# Patient Record
Sex: Male | Born: 1965 | Race: White | Hispanic: No | Marital: Married | Smoking: Former smoker
Health system: Southern US, Community
[De-identification: ages and names within clinical notes are randomized; demographics above are authoritative.]

## PROBLEM LIST (undated history)

## (undated) DIAGNOSIS — I10 Essential (primary) hypertension: Secondary | ICD-10-CM

## (undated) DIAGNOSIS — R0600 Dyspnea, unspecified: Secondary | ICD-10-CM

## (undated) DIAGNOSIS — F419 Anxiety disorder, unspecified: Secondary | ICD-10-CM

## (undated) DIAGNOSIS — E663 Overweight: Secondary | ICD-10-CM

## (undated) DIAGNOSIS — F4310 Post-traumatic stress disorder, unspecified: Secondary | ICD-10-CM

## (undated) DIAGNOSIS — D649 Anemia, unspecified: Secondary | ICD-10-CM

## (undated) DIAGNOSIS — J449 Chronic obstructive pulmonary disease, unspecified: Secondary | ICD-10-CM

## (undated) DIAGNOSIS — R9439 Abnormal result of other cardiovascular function study: Secondary | ICD-10-CM

## (undated) DIAGNOSIS — E871 Hypo-osmolality and hyponatremia: Secondary | ICD-10-CM

## (undated) DIAGNOSIS — F32A Depression, unspecified: Secondary | ICD-10-CM

## (undated) DIAGNOSIS — M1990 Unspecified osteoarthritis, unspecified site: Secondary | ICD-10-CM

## (undated) DIAGNOSIS — K219 Gastro-esophageal reflux disease without esophagitis: Secondary | ICD-10-CM

## (undated) DIAGNOSIS — R079 Chest pain, unspecified: Secondary | ICD-10-CM

## (undated) DIAGNOSIS — I83893 Varicose veins of bilateral lower extremities with other complications: Secondary | ICD-10-CM

## (undated) HISTORY — DX: Varicose veins of bilateral lower extremities with other complications: I83.893

## (undated) HISTORY — DX: Essential (primary) hypertension: I10

## (undated) HISTORY — PX: LUMBAR LAMINECTOMY: SHX95

## (undated) HISTORY — DX: Hypo-osmolality and hyponatremia: E87.1

## (undated) HISTORY — PX: REVISION TOTAL HIP ARTHROPLASTY: SHX766

## (undated) HISTORY — PX: WISDOM TOOTH EXTRACTION: SHX21

## (undated) HISTORY — DX: Abnormal result of other cardiovascular function study: R94.39

## (undated) HISTORY — DX: Overweight: E66.3

## (undated) HISTORY — DX: Chest pain, unspecified: R07.9

## (undated) HISTORY — PX: TENDON RELEASE: SHX230

---

## 1998-01-26 HISTORY — PX: LUMBAR LAMINECTOMY: SHX95

## 2006-01-26 HISTORY — PX: KNEE ARTHROSCOPY: SHX127

## 2016-01-27 HISTORY — PX: TENDON RELEASE: SHX230

## 2016-04-21 DIAGNOSIS — G8929 Other chronic pain: Secondary | ICD-10-CM | POA: Insufficient documentation

## 2016-04-21 DIAGNOSIS — M549 Dorsalgia, unspecified: Secondary | ICD-10-CM | POA: Insufficient documentation

## 2016-10-21 DIAGNOSIS — M5412 Radiculopathy, cervical region: Secondary | ICD-10-CM | POA: Insufficient documentation

## 2019-01-27 HISTORY — PX: HIP ARTHROPLASTY: SHX981

## 2019-02-27 DIAGNOSIS — R079 Chest pain, unspecified: Secondary | ICD-10-CM

## 2019-02-27 HISTORY — DX: Chest pain, unspecified: R07.9

## 2019-03-06 ENCOUNTER — Telehealth: Payer: Self-pay | Admitting: Internal Medicine

## 2019-03-06 ENCOUNTER — Ambulatory Visit (INDEPENDENT_AMBULATORY_CARE_PROVIDER_SITE_OTHER): Payer: No Typology Code available for payment source | Admitting: Internal Medicine

## 2019-03-06 ENCOUNTER — Other Ambulatory Visit: Payer: Self-pay

## 2019-03-06 ENCOUNTER — Encounter: Payer: Self-pay | Admitting: Internal Medicine

## 2019-03-06 VITALS — BP 118/88 | HR 81 | Temp 97.3°F | Ht 75.0 in | Wt 231.8 lb

## 2019-03-06 DIAGNOSIS — R9431 Abnormal electrocardiogram [ECG] [EKG]: Secondary | ICD-10-CM

## 2019-03-06 DIAGNOSIS — R0609 Other forms of dyspnea: Secondary | ICD-10-CM

## 2019-03-06 DIAGNOSIS — I10 Essential (primary) hypertension: Secondary | ICD-10-CM

## 2019-03-06 DIAGNOSIS — R079 Chest pain, unspecified: Secondary | ICD-10-CM

## 2019-03-06 DIAGNOSIS — R0600 Dyspnea, unspecified: Secondary | ICD-10-CM | POA: Diagnosis not present

## 2019-03-06 NOTE — Telephone Encounter (Signed)
Routed to primary nurse and medical records dept

## 2019-03-06 NOTE — Telephone Encounter (Signed)
Sarah from the [facility] was requesting visit notes from the patient's appointment today. Please fax notes to [phone]

## 2019-03-06 NOTE — Progress Notes (Signed)
Cardiology Office Note:    Date:  03/06/2019   ID:  Reginald Walsh, DOB 10/16/1965, MRN [id number]  PCP:  Patient, No Pcp Per  Cardiologist:  No primary care provider on file.  Electrophysiologist:  None   Referring MD: Annetta Maw, MD   Chief Complaint: DOE, chest pain  History of Present Illness:    Reginald Walsh is a 54 y.o. male with a history of HTN and anxiety who presents for evaluation of chest pain.  Started Feb of last year - progressive sob, especially with anxiety. Unusual fatigue for him. Chest pain with heavy exertion. Substernal pain, non radiating. Subsides quickly (seconds to minutes). Not associated with deep breathing.   Used to do [employer], but gained some weight recently, notices that when he carries 2 loads of groceries he will have dyspnea on exertion, with a twinge of chest pain. He notices the chest twinge a few times a week. Feels it may be related to anxiety.   Paternal side - strong family history of MI. No SCD known.  Mothers side  Diabetes and smoking.   History of normal echo per his report however Q waves on ekg from 2017.  Past Medical History:  Diagnosis Date  . Hypertension     History reviewed. No pertinent surgical history.  Current Medications: Current Meds  Medication Sig  . HYDROcodone-acetaminophen (HYCET) 7.5-325 mg/15 ml solution 1 tablet as needed     Allergies:   Patient has no allergy information on record.   Social History   Socioeconomic History  . Marital status: Single    Spouse name: Not on file  . Number of children: Not on file  . Years of education: Not on file  . Highest education level: Not on file  Occupational History  . Not on file  Tobacco Use  . Smoking status: Current Every Day Smoker    Types: E-cigarettes  . Smokeless tobacco: Current User  Substance and Sexual Activity  . Alcohol use: Yes    Comment: occasional  . Drug use: Never  . Sexual activity: Yes  Other Topics Concern  . Not on  file  Social History Narrative  . Not on file   Social Determinants of Health   Financial Resource Strain:   . Difficulty of Paying Living Expenses: Not on file  Food Insecurity:   . Worried About Charity fundraiser in the Last Year: Not on file  . Ran Out of Food in the Last Year: Not on file  Transportation Needs:   . Lack of Transportation (Medical): Not on file  . Lack of Transportation (Non-Medical): Not on file  Physical Activity:   . Days of Exercise per Week: Not on file  . Minutes of Exercise per Session: Not on file  Stress:   . Feeling of Stress : Not on file  Social Connections:   . Frequency of Communication with Friends and Family: Not on file  . Frequency of Social Gatherings with Friends and Family: Not on file  . Attends Religious Services: Not on file  . Active Member of Clubs or Organizations: Not on file  . Attends Archivist Meetings: Not on file  . Marital Status: Not on file     Family History: The patient's family history is significant for CAD, Diabetes.   ROS:   Please see the history of present illness.    All other systems reviewed and are negative.  EKGs/Labs/Other Studies Reviewed:    The  following studies were reviewed today:  EKG:  NSR, LVH, inferior infarct age indeterminate.   No significant change from ECG patient brings with him today from 2017.   Recent Labs: No results found for requested labs within last 8760 hours.  Recent Lipid Panel No results found for: CHOL, TRIG, HDL, CHOLHDL, VLDL, LDLCALC, LDLDIRECT  Physical Exam:    VS:  BP 118/88   Pulse 81   Temp (!) 97.3 F (36.3 C)   Ht 5\' 3"  (1.6 m)   Wt 231 lb 12.8 oz (105.1 kg)   SpO2 97%   BMI 41.06 kg/m     Wt Readings from Last 5 Encounters:  03/06/19 231 lb 12.8 oz (105.1 kg)     Constitutional: No acute distress Eyes: sclera non-icteric, normal conjunctiva and lids Cardiovascular: regular rhythm, normal rate, no murmurs. S1 and S2 normal. Radial  pulses normal bilaterally. No jugular venous distention.  Respiratory: clear to auscultation bilaterally GI : normal bowel sounds, soft and nontender. No distention.   MSK: extremities warm, well perfused. No edema.  NEURO: grossly nonfocal exam, moves all extremities. PSYCH: alert and oriented x 3, normal mood and affect.   ASSESSMENT:    1. DOE (dyspnea on exertion)   2. Chest pain, unspecified type   3. Essential hypertension   4. Abnormal EKG    PLAN:    DOE/Chest pain - With history of hypertension and exercise intolerance, we will peform a treadmill exercise test to exclude ischemia. Patient tells me can perform a treadmill test and this will provide information about his new exercise intolerance. He has an abnormal EKG, so we will include imaging in stress modality. Patient tells me he has recently had a normal echo and while the report is not available for my review patient shows me a personal email with a brief description of findings. If there are any concerning findings on stress we will consider echocardiogram given abnormal EKG. ADDENDUM: patient inquired through MyChart if he can have anti-anxiety medication prior to his treadmill stress test. I have instructed the patient that it would not be safe to provide sedating medications prior to exercise stress test given concerns for possible injury that could occur if sedated, and that we will not provide a prescription for anxiety medications.   HTN - well controlled at this time, continue losartan and amlodipine.   Total time of encounter: 60 minutes total time of encounter, including 35 minutes spent in face-to-face patient care. This time includes coordination of care and counseling regarding above mentioned problem list. Remainder of non-face-to-face time involved reviewing chart documents/testing relevant to the patient encounter and documentation in the medical record. I have independently reviewed documentation from referring  provider. I have reviewed approximately 10 pages of outside ([place]) records in conjunction with this consult.   Cherlynn Kaiser, MD [place]  [facility]    Medication Adjustments/Labs and Tests Ordered: Current medicines are reviewed at length with the patient today.  Concerns regarding medicines are outlined above.  Orders Placed This Encounter  Procedures  . MYOCARDIAL PERFUSION IMAGING  . EKG 12-Lead   No orders of the defined types were placed in this encounter.   Patient Instructions  Medication Instructions:  NO CHANGES  Lab Work:  will need a covid test 3 days  prior to Stress myoview  ( will need to self isolate between the covid test and stress test  Testing/Procedures: WILL BE SCHEDULE AT [place] has requested that  you have en exercise stress myoview.  Please follow instruction sheet, as given.   Follow-Up: At [facility], you and your health needs are our priority.  As part of our continuing mission to provide you with exceptional heart care, we have created designated Provider Care Teams.  These Care Teams include your primary Cardiologist (physician) and Advanced Practice Providers (APPs -  Physician Assistants and Nurse Practitioners) who all work together to provide you with the care you need, when you need it.  Your next appointment:   4 week(s)  The format for your next appointment:   In Person  Provider:   Kerin Ransom, PA-C  Other Instructions N/a    Cardiac Nuclear Scan A cardiac nuclear scan is a test that measures blood flow to the heart when a person is resting and when he or she is exercising. The test looks for problems such as:  Not enough blood reaching a portion of the heart.  The heart muscle not working normally. You may need this test if:  You have heart disease.  You have had abnormal lab results.  You have had heart surgery or a balloon procedure to open up blocked arteries  (angioplasty).  You have chest pain.  You have shortness of breath. In this test, a radioactive dye (tracer) is injected into your bloodstream. After the tracer has traveled to your heart, an imaging device is used to measure how much of the tracer is absorbed by or distributed to various areas of your heart. This procedure is usually done at a hospital and takes 2-4 hours. Tell a health care provider about:  Any allergies you have.  All medicines you are taking, including vitamins, herbs, eye drops, creams, and over-the-counter medicines.  Any problems you or family members have had with anesthetic medicines.  Any blood disorders you have.  Any surgeries you have had.  Any medical conditions you have.  Whether you are pregnant or may be pregnant. What are the risks? Generally, this is a safe procedure. However, problems may occur, including:  Serious chest pain and heart attack. This is only a risk if the stress portion of the test is done.  Rapid heartbeat.  Sensation of warmth in your chest. This usually passes quickly.  Allergic reaction to the tracer. What happens before the procedure?  Ask your health care provider about changing or stopping your regular medicines. This is especially important if you are taking diabetes medicines or blood thinners.  Follow instructions from your health care provider about eating or drinking restrictions.  Remove your jewelry on the day of the procedure. What happens during the procedure?  An IV will be inserted into one of your veins.  Your health care provider will inject a small amount of radioactive tracer through the IV.  You will wait for 20-40 minutes while the tracer travels through your bloodstream.  Your heart activity will be monitored with an electrocardiogram (ECG).  You will lie down on an exam table.  Images of your heart will be taken for about 15-20 minutes.  You may also have a stress test. For this test, one  of the following may be done: ? You will exercise on a treadmill or stationary bike. While you exercise, your heart's activity will be monitored with an ECG, and your blood pressure will be checked. ? You will be given medicines that will increase blood flow to parts of your heart. This is done if you are unable to exercise.  When  blood flow to your heart has peaked, a tracer will again be injected through the IV.  After 20-40 minutes, you will get back on the exam table and have more images taken of your heart.  Depending on the type of tracer used, scans may need to be repeated 3-4 hours later.  Your IV line will be removed when the procedure is over. The procedure may vary among health care providers and hospitals. What happens after the procedure?  Unless your health care provider tells you otherwise, you may return to your normal schedule, including diet, activities, and medicines.  Unless your health care provider tells you otherwise, you may increase your fluid intake. This will help to flush the contrast dye from your body. Drink enough fluid to keep your urine pale yellow.  Ask your health care provider, or the department that is doing the test: ? When will my results be ready? ? How will I get my results? Summary  A cardiac nuclear scan measures the blood flow to the heart when a person is resting and when he or she is exercising.  Tell your health care provider if you are pregnant.  Before the procedure, ask your health care provider about changing or stopping your regular medicines. This is especially important if you are taking diabetes medicines or blood thinners.  After the procedure, unless your health care provider tells you otherwise, increase your fluid intake. This will help flush the contrast dye from your body.  After the procedure, unless your health care provider tells you otherwise, you may return to your normal schedule, including diet, activities, and  medicines. This information is not intended to replace advice given to you by your health care provider. Make sure you discuss any questions you have with your health care provider. Document Revised: 06/28/2017 Document Reviewed: 06/28/2017 Elsevier Patient Education  [place].

## 2019-03-06 NOTE — Patient Instructions (Signed)
Medication Instructions:  NO CHANGES  Lab Work:  will need a covid test 3 days  prior to Stress myoview  ( will need to self isolate between the covid test and stress test  Testing/Procedures: WILL BE SCHEDULE AT [place] has requested that you have en exercise stress myoview.  Please follow instruction sheet, as given.   Follow-Up: At [facility], you and your health needs are our priority.  As part of our continuing mission to provide you with exceptional heart care, we have created designated Provider Care Teams.  These Care Teams include your primary Cardiologist (physician) and Advanced Practice Providers (APPs -  Physician Assistants and Nurse Practitioners) who all work together to provide you with the care you need, when you need it.  Your next appointment:   4 week(s)  The format for your next appointment:   In Person  Provider:   Kerin Ransom, PA-C  Other Instructions N/a    Cardiac Nuclear Scan A cardiac nuclear scan is a test that measures blood flow to the heart when a person is resting and when he or she is exercising. The test looks for problems such as:  Not enough blood reaching a portion of the heart.  The heart muscle not working normally. You may need this test if:  You have heart disease.  You have had abnormal lab results.  You have had heart surgery or a balloon procedure to open up blocked arteries (angioplasty).  You have chest pain.  You have shortness of breath. In this test, a radioactive dye (tracer) is injected into your bloodstream. After the tracer has traveled to your heart, an imaging device is used to measure how much of the tracer is absorbed by or distributed to various areas of your heart. This procedure is usually done at a hospital and takes 2-4 hours. Tell a health care provider about:  Any allergies you have.  All medicines you are taking, including vitamins, herbs, eye drops, creams, and  over-the-counter medicines.  Any problems you or family members have had with anesthetic medicines.  Any blood disorders you have.  Any surgeries you have had.  Any medical conditions you have.  Whether you are pregnant or may be pregnant. What are the risks? Generally, this is a safe procedure. However, problems may occur, including:  Serious chest pain and heart attack. This is only a risk if the stress portion of the test is done.  Rapid heartbeat.  Sensation of warmth in your chest. This usually passes quickly.  Allergic reaction to the tracer. What happens before the procedure?  Ask your health care provider about changing or stopping your regular medicines. This is especially important if you are taking diabetes medicines or blood thinners.  Follow instructions from your health care provider about eating or drinking restrictions.  Remove your jewelry on the day of the procedure. What happens during the procedure?  An IV will be inserted into one of your veins.  Your health care provider will inject a small amount of radioactive tracer through the IV.  You will wait for 20-40 minutes while the tracer travels through your bloodstream.  Your heart activity will be monitored with an electrocardiogram (ECG).  You will lie down on an exam table.  Images of your heart will be taken for about 15-20 minutes.  You may also have a stress test. For this test, one of the following may be done: ? You will exercise on a  treadmill or stationary bike. While you exercise, your heart's activity will be monitored with an ECG, and your blood pressure will be checked. ? You will be given medicines that will increase blood flow to parts of your heart. This is done if you are unable to exercise.  When blood flow to your heart has peaked, a tracer will again be injected through the IV.  After 20-40 minutes, you will get back on the exam table and have more images taken of your  heart.  Depending on the type of tracer used, scans may need to be repeated 3-4 hours later.  Your IV line will be removed when the procedure is over. The procedure may vary among health care providers and hospitals. What happens after the procedure?  Unless your health care provider tells you otherwise, you may return to your normal schedule, including diet, activities, and medicines.  Unless your health care provider tells you otherwise, you may increase your fluid intake. This will help to flush the contrast dye from your body. Drink enough fluid to keep your urine pale yellow.  Ask your health care provider, or the department that is doing the test: ? When will my results be ready? ? How will I get my results? Summary  A cardiac nuclear scan measures the blood flow to the heart when a person is resting and when he or she is exercising.  Tell your health care provider if you are pregnant.  Before the procedure, ask your health care provider about changing or stopping your regular medicines. This is especially important if you are taking diabetes medicines or blood thinners.  After the procedure, unless your health care provider tells you otherwise, increase your fluid intake. This will help flush the contrast dye from your body.  After the procedure, unless your health care provider tells you otherwise, you may return to your normal schedule, including diet, activities, and medicines. This information is not intended to replace advice given to you by your health care provider. Make sure you discuss any questions you have with your health care provider. Document Revised: 06/28/2017 Document Reviewed: 06/28/2017 Elsevier Patient Education  [place].

## 2019-03-07 ENCOUNTER — Other Ambulatory Visit (HOSPITAL_COMMUNITY): Payer: No Typology Code available for payment source

## 2019-03-09 ENCOUNTER — Telehealth (HOSPITAL_COMMUNITY): Payer: Self-pay

## 2019-03-09 NOTE — Telephone Encounter (Signed)
Spoke with the patient at length, instructions given. Asked to call back with any questions. He stated that he would be here for his test. S.Mykhia Danish EMTP

## 2019-03-10 ENCOUNTER — Other Ambulatory Visit (HOSPITAL_COMMUNITY)
Admission: RE | Admit: 2019-03-10 | Discharge: 2019-03-10 | Disposition: A | Discharge status: Home / Self Care | Payer: No Typology Code available for payment source | Source: Ambulatory Visit | Attending: Internal Medicine | Admitting: Internal Medicine

## 2019-03-10 DIAGNOSIS — Z01812 Encounter for preprocedural laboratory examination: Secondary | ICD-10-CM | POA: Diagnosis present

## 2019-03-10 DIAGNOSIS — Z20822 Contact with and (suspected) exposure to covid-19: Secondary | ICD-10-CM | POA: Insufficient documentation

## 2019-03-10 LAB — SARS CORONAVIRUS 2 (TAT 6-24 HRS): SARS Coronavirus 2: NEGATIVE

## 2019-03-13 ENCOUNTER — Encounter: Payer: Self-pay | Admitting: Internal Medicine

## 2019-03-13 NOTE — Telephone Encounter (Signed)
FAXED REQUESTED  OFFICE NOTE 03/06/19   LEFT A MESSAGE FOR  [place] - OF OFFICE NOTE BEING FAXED ALONG WITH EKG

## 2019-03-14 ENCOUNTER — Ambulatory Visit (HOSPITAL_COMMUNITY): Payer: No Typology Code available for payment source | Attending: Cardiovascular Disease

## 2019-03-14 ENCOUNTER — Other Ambulatory Visit: Payer: Self-pay

## 2019-03-14 DIAGNOSIS — R0600 Dyspnea, unspecified: Secondary | ICD-10-CM | POA: Insufficient documentation

## 2019-03-14 DIAGNOSIS — R079 Chest pain, unspecified: Secondary | ICD-10-CM | POA: Insufficient documentation

## 2019-03-14 DIAGNOSIS — R0609 Other forms of dyspnea: Secondary | ICD-10-CM

## 2019-03-14 LAB — MYOCARDIAL PERFUSION IMAGING
Estimated workload: 10.1 METS
Exercise duration (min): 8 min
Exercise duration (sec): 1 s
LV dias vol: 95 mL (ref 62–150)
LV sys vol: 49 mL
MPHR: 166 {beats}/min
Peak HR: 148 {beats}/min
Percent HR: 89 %
Rest HR: 83 {beats}/min
SDS: 2
SRS: 0
SSS: 2
TID: 0.94

## 2019-03-14 MED ORDER — TECHNETIUM TC 99M TETROFOSMIN IV KIT
8.5000 | PACK | Freq: Once | INTRAVENOUS | Status: AC | PRN
  Administered 2019-03-14: 8.5 via INTRAVENOUS
  Filled 2019-03-14: qty 9

## 2019-03-14 MED ORDER — TECHNETIUM TC 99M TETROFOSMIN IV KIT
27.2000 | PACK | Freq: Once | INTRAVENOUS | Status: AC | PRN
  Administered 2019-03-14: 27.2 via INTRAVENOUS
  Filled 2019-03-14: qty 28

## 2019-03-15 ENCOUNTER — Ambulatory Visit: Payer: No Typology Code available for payment source | Admitting: Physician Assistant

## 2019-03-16 ENCOUNTER — Encounter: Payer: Self-pay | Admitting: Cardiology

## 2019-03-16 ENCOUNTER — Telehealth (INDEPENDENT_AMBULATORY_CARE_PROVIDER_SITE_OTHER): Payer: No Typology Code available for payment source | Admitting: Cardiology

## 2019-03-16 VITALS — BP 110/72 | HR 80 | Ht 75.0 in | Wt 230.0 lb

## 2019-03-16 DIAGNOSIS — I10 Essential (primary) hypertension: Secondary | ICD-10-CM

## 2019-03-16 DIAGNOSIS — Z8249 Family history of ischemic heart disease and other diseases of the circulatory system: Secondary | ICD-10-CM

## 2019-03-16 DIAGNOSIS — R0600 Dyspnea, unspecified: Secondary | ICD-10-CM

## 2019-03-16 DIAGNOSIS — R079 Chest pain, unspecified: Secondary | ICD-10-CM

## 2019-03-16 DIAGNOSIS — R9431 Abnormal electrocardiogram [ECG] [EKG]: Secondary | ICD-10-CM

## 2019-03-16 DIAGNOSIS — Z01812 Encounter for preprocedural laboratory examination: Secondary | ICD-10-CM

## 2019-03-16 DIAGNOSIS — F419 Anxiety disorder, unspecified: Secondary | ICD-10-CM | POA: Diagnosis not present

## 2019-03-16 DIAGNOSIS — R9439 Abnormal result of other cardiovascular function study: Secondary | ICD-10-CM

## 2019-03-16 MED ORDER — NITROGLYCERIN 0.4 MG SL SUBL: 0.4000 mg | SUBLINGUAL_TABLET | SUBLINGUAL | 3 refills | Status: DC | PRN

## 2019-03-16 MED ORDER — METOPROLOL SUCCINATE ER 25 MG PO TB24: 25.0000 mg | ORAL_TABLET | Freq: Every day | ORAL | 0 refills | Status: DC

## 2019-03-16 MED ORDER — ASPIRIN EC 81 MG PO TBEC: 81.0000 mg | DELAYED_RELEASE_TABLET | Freq: Every day | ORAL | 3 refills | Status: DC

## 2019-03-16 MED ORDER — ALPRAZOLAM 0.25 MG PO TABS: 0.2500 mg | ORAL_TABLET | Freq: Two times a day (BID) | ORAL | 0 refills | Status: DC | PRN

## 2019-03-16 NOTE — Patient Instructions (Addendum)
Medication Instructions:  START metoprolol succinate (Toprol XL) 25 mg daily START Aspirin 81 mg daily Take sublingual nitroglycerin AS NEEDED-1 tablet under the tongue every 5 minutes as needed (Max 3 tablets) Take Xanex 0.25 mg two times daily AS NEEDED  *If you need a refill on your cardiac medications before your next appointment, please call your pharmacy*  Lab Work: BMET, [place] TEST TOMORROW AT 12:10 AT [place]  If you have labs (blood work) drawn today and your tests are completely normal, you will receive your results only by: Marland Kitchen MyChart Message (if you have MyChart) OR . A paper copy in the mail If you have any lab test that is abnormal or we need to change your treatment, we will call you to review the results.  Testing/Procedures: Your physician has requested that you have a cardiac catheterization. Cardiac catheterization is used to diagnose and/or treat various heart conditions. Doctors may recommend this procedure for a number of different reasons. The most common reason is to evaluate chest pain. Chest pain can be a symptom of coronary artery disease (CAD), and cardiac catheterization can show whether plaque is narrowing or blocking your heart's arteries. This procedure is also used to evaluate the valves, as well as measure the blood flow and oxygen levels in different parts of your heart. For further information please visit [email or web address]. Please follow instruction sheet, as given.  Follow-Up: At [facility], you and your health needs are our priority.  As part of our continuing mission to provide you with exceptional heart care, we have created designated Provider Care Teams.  These Care Teams include your primary Cardiologist (physician) and Advanced Practice Providers (APPs -  Physician Assistants and Nurse Practitioners) who all work together to provide you with the care you need, when you need it.  Your next appointment:   As  scheduled: 3/9 at 8:40 AM (in person) with Dr. Margaretann Loveless

## 2019-03-16 NOTE — H&P (View-Only) (Signed)
Virtual Visit via Telephone Note   This visit type was conducted due to national recommendations for restrictions regarding the COVID-19 Pandemic (e.g. social distancing) in an effort to limit this patient's exposure and mitigate transmission in our community.  Due to his co-morbid illnesses, this patient is at least at moderate risk for complications without adequate follow up.  This format is felt to be most appropriate for this patient at this time.  The patient did not have access to video technology/had technical difficulties with video requiring transitioning to audio format only (telephone).  All issues noted in this document were discussed and addressed.  No physical exam could be performed with this format.  Please refer to the patient's chart for his  consent to telehealth for [facility]   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  He did do a tour in [place].  He is followed at the [place].  The patient recently saw Dr. Margaretann Loveless for evaluation of chest pain.  Since he retired and moved to the area he has gained some weight.  He wanted to start exercising.  The patient described intermittent exertional dull chest pain and some dyspnea on exertion.  He does have an abnormal EKG with inferior Q waves.  He describes an episode sometime ago where he had severe substernal chest pain after a workout.  He tells me he had an echocardiogram in the past (not available) that had some abnormality but it was not further worked up.  The patient had a exercise Myoview 03/14/2019.  This was low risk with  an EF of 45 to 54% but there was a basilar inferior and mid inferior defect of medium severity.  With extracardiac activity noted in the diaphragm it was felt it may be attenuation but a fixed defect could not be excluded.  I contacted the patient today for follow-up.  He apparently was adopted.  He did locate some family in his 1s.  He tells me that they related to him there is a strong family history of coronary disease.  The patient does have a history of hypertension and is on multiple medications.  He also says he has hyperlipidemia but this is note treated.   He related to me an episode a week ago when he was helping a neighbor clear a fallen tree when he became profoundly weak and had to stop after just 5 minutes.  He notes that in the past activity like this was easily tolerated for him. He denies any symptoms at rest.   The patient does not have symptoms concerning for COVID-19 infection (fever, chills, cough, or new shortness of breath).    Past Medical History:  Diagnosis Date  . Hypertension    No past surgical history on file.   Current Meds  Medication Sig  . amLODipine (NORVASC) 10 MG tablet 10 mg daily.   . baclofen (LIORESAL) 10 MG tablet Take 10 mg by mouth as needed for muscle spasms.  . fluticasone (FLONASE) 50 MCG/ACT nasal spray daily.   . hydrochlorothiazide (HYDRODIURIL)  25 MG tablet Take 25 mg by mouth daily.  Marland Kitchen HYDROcodone-acetaminophen (NORCO) 7.5-325 MG tablet Take 1 tablet by mouth every 6 (six) hours as needed for moderate pain.  . Ipratropium-Albuterol (ALBUTEROL-IPRATROPIUM IN)   . losartan (COZAAR) 100 MG tablet Take 100 mg by mouth.   . montelukast (SINGULAIR) 10 MG tablet at bedtime.   . Multiple Vitamins-Minerals (MENS MULTIVITAMIN PLUS PO)   . omega-3 fish oil (MAXEPA) 1000 MG CAPS capsule 1 capsule daily.   . pantoprazole (PROTONIX) 40 MG tablet Take 40 mg by mouth daily.  . pregabalin (LYRICA) 150 MG capsule 150 mg 2 (two) times daily.   .  pseudoephedrine (SUDAFED) 60 MG tablet 60 mg as needed.      Allergies:   Patient has no allergy information on record.   Social History   Tobacco Use  . Smoking status: Current Every Day Smoker    Types: E-cigarettes  . Smokeless tobacco: Current User  Substance Use Topics  . Alcohol use: Yes    Comment: occasional  . Drug use: Never     Family Hx: The patient's family history is positive for CAD  ROS:   Please see the history of present illness.    Anxiety All other systems reviewed and are negative.   Prior CV studies:   The following studies were reviewed today: GXT Myoview 03/14/2019  Labs/Other Tests and Data Reviewed:    EKG:  An ECG dated 03/06/2019 was personally reviewed today and demonstrated:  NSR, HR 80, inferior Qs, LVH by voltage  Recent Labs: No results found for requested labs within last 8760 hours.   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  We feel its best to proceed with a diagnostic cath.  The patient is going out of town for the weekend and would like to proceed next week.  He is not having rest symptoms so I feel this is OK. He knows to take it easy.  I did add Toprol XL 25 mg and SL NTG PRN.  I offered Imdur but he declined.  He did request Xanax  for anxiety (? PTSD) and I provided this as well.   The patient understands that risks included but are not limited to stroke (1 in 1000), death (1 in 11), kidney failure [usually temporary] (1 in 500), bleeding (1 in 200), allergic reaction [possibly serious] (1 in 200).  The patient understands and agrees to proceed.    COVID-19 Education: The signs and symptoms of COVID-19 were discussed with the patient and how to seek care for testing (follow up with PCP or arrange E-visit).  The importance of social  distancing was discussed today.  Time:   Today, I have spent 25 minutes with the patient with telehealth technology discussing the above problems.     Medication Adjustments/Labs and Tests Ordered: Current medicines are reviewed at length with the patient today.  Concerns regarding medicines are outlined above.   Tests Ordered: No orders of the defined types were placed in this encounter.   Medication Changes: No orders of the defined types were placed in this encounter.   Follow Up:  F/U 2-3 weeks after his cath with me in the office.  Angelena Form, PA-C  03/16/2019 12:09 PM    [facility]

## 2019-03-16 NOTE — Progress Notes (Signed)
Virtual Visit via Telephone Note   This visit type was conducted due to national recommendations for restrictions regarding the COVID-19 Pandemic (e.g. social distancing) in an effort to limit this patient's exposure and mitigate transmission in our community.  Due to his co-morbid illnesses, this patient is at least at moderate risk for complications without adequate follow up.  This format is felt to be most appropriate for this patient at this time.  The patient did not have access to video technology/had technical difficulties with video requiring transitioning to audio format only (telephone).  All issues noted in this document were discussed and addressed.  No physical exam could be performed with this format.  Please refer to the patient's chart for his  consent to telehealth for [facility].   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  He did do a tour in [place].  He is followed at the [place].  The patient recently saw Dr. Margaretann Loveless for evaluation of chest pain.  Since he retired and moved to the area he has gained some weight.  He wanted to start exercising.  The patient described intermittent exertional dull chest pain and some dyspnea on exertion.  He does have an abnormal EKG with inferior Q waves.  He describes an episode sometime ago where he had severe substernal chest pain after a workout.  He tells me he had an echocardiogram in the past (not available) that had some abnormality but it was not further worked up.  The patient had a exercise Myoview 03/14/2019.  This was low risk with  an EF of 45 to 54% but there was a basilar inferior and mid inferior defect of medium severity.  With extracardiac activity noted in the diaphragm it was felt it may be attenuation but a fixed defect could not be excluded.  I contacted the patient today for follow-up.  He apparently was adopted.  He did locate some family in his 12s.  He tells me that they related to him there is a strong family history of coronary disease.  The patient does have a history of hypertension and is on multiple medications.  He also says he has hyperlipidemia but this is note treated.   He related to me an episode a week ago when he was helping a neighbor clear a fallen tree when he became profoundly weak and had to stop after just 5 minutes.  He notes that in the past activity like this was easily tolerated for him. He denies any symptoms at rest.   The patient does not have symptoms concerning for COVID-19 infection (fever, chills, cough, or new shortness of breath).    Past Medical History:  Diagnosis Date  . Hypertension    No past surgical history on file.   Current Meds  Medication Sig  . amLODipine (NORVASC) 10 MG tablet 10 mg daily.   . baclofen (LIORESAL) 10 MG tablet Take 10 mg by mouth as needed for muscle spasms.  . fluticasone (FLONASE) 50 MCG/ACT nasal spray daily.   . hydrochlorothiazide (HYDRODIURIL)  25 MG tablet Take 25 mg by mouth daily.  Marland Kitchen HYDROcodone-acetaminophen (NORCO) 7.5-325 MG tablet Take 1 tablet by mouth every 6 (six) hours as needed for moderate pain.  . Ipratropium-Albuterol (ALBUTEROL-IPRATROPIUM IN)   . losartan (COZAAR) 100 MG tablet Take 100 mg by mouth.   . montelukast (SINGULAIR) 10 MG tablet at bedtime.   . Multiple Vitamins-Minerals (MENS MULTIVITAMIN PLUS PO)   . omega-3 fish oil (MAXEPA) 1000 MG CAPS capsule 1 capsule daily.   . pantoprazole (PROTONIX) 40 MG tablet Take 40 mg by mouth daily.  . pregabalin (LYRICA) 150 MG capsule 150 mg 2 (two) times daily.   .  pseudoephedrine (SUDAFED) 60 MG tablet 60 mg as needed.      Allergies:   Patient has no allergy information on record.   Social History   Tobacco Use  . Smoking status: Current Every Day Smoker    Types: E-cigarettes  . Smokeless tobacco: Current User  Substance Use Topics  . Alcohol use: Yes    Comment: occasional  . Drug use: Never     Family Hx: The patient's family history is positive for CAD  ROS:   Please see the history of present illness.    Anxiety All other systems reviewed and are negative.   Prior CV studies:   The following studies were reviewed today: GXT Myoview 03/14/2019  Labs/Other Tests and Data Reviewed:    EKG:  An ECG dated 03/06/2019 was personally reviewed today and demonstrated:  NSR, HR 80, inferior Qs, LVH by voltage  Recent Labs: No results found for requested labs within last 8760 hours.   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  We feel its best to proceed with a diagnostic cath.  The patient is going out of town for the weekend and would like to proceed next week.  He is not having rest symptoms so I feel this is OK. He knows to take it easy.  I did add Toprol XL 25 mg and SL NTG PRN.  I offered Imdur but he declined.  He did request Xanax  for anxiety (? PTSD) and I provided this as well.   The patient understands that risks included but are not limited to stroke (1 in 1000), death (1 in 1), kidney failure [usually temporary] (1 in 500), bleeding (1 in 200), allergic reaction [possibly serious] (1 in 200).  The patient understands and agrees to proceed.    COVID-19 Education: The signs and symptoms of COVID-19 were discussed with the patient and how to seek care for testing (follow up with PCP or arrange E-visit).  The importance of social  distancing was discussed today.  Time:   Today, I have spent 25 minutes with the patient with telehealth technology discussing the above problems.     Medication Adjustments/Labs and Tests Ordered: Current medicines are reviewed at length with the patient today.  Concerns regarding medicines are outlined above.   Tests Ordered: No orders of the defined types were placed in this encounter.   Medication Changes: No orders of the defined types were placed in this encounter.   Follow Up:  F/U 2-3 weeks after his cath with me in the office.  Angelena Form, PA-C  03/16/2019 12:09 PM    [facility]

## 2019-03-17 ENCOUNTER — Other Ambulatory Visit (HOSPITAL_COMMUNITY)
Admission: RE | Admit: 2019-03-17 | Discharge: 2019-03-17 | Disposition: A | Discharge status: Home / Self Care | Payer: No Typology Code available for payment source | Source: Ambulatory Visit | Attending: Cardiovascular Disease | Admitting: Cardiovascular Disease

## 2019-03-17 ENCOUNTER — Ambulatory Visit: Payer: No Typology Code available for payment source | Admitting: Cardiology

## 2019-03-17 DIAGNOSIS — Z20822 Contact with and (suspected) exposure to covid-19: Secondary | ICD-10-CM | POA: Diagnosis not present

## 2019-03-17 DIAGNOSIS — Z01812 Encounter for preprocedural laboratory examination: Secondary | ICD-10-CM | POA: Diagnosis present

## 2019-03-17 LAB — LIPID PANEL
Chol/HDL Ratio: 2.9 ratio (ref 0.0–5.0)
Cholesterol, Total: 229 mg/dL — ABNORMAL HIGH (ref 100–199)
HDL: 80 mg/dL (ref >39)
LDL Chol Calc (NIH): 118 mg/dL — ABNORMAL HIGH (ref 0–99)
Triglycerides: 181 mg/dL — ABNORMAL HIGH (ref 0–149)
VLDL Cholesterol Cal: 31 mg/dL (ref 5–40)

## 2019-03-17 LAB — BASIC METABOLIC PANEL
BUN/Creatinine Ratio: 11 (ref 9–20)
BUN: 11 mg/dL (ref 6–24)
CO2: 20 mmol/L (ref 20–29)
Calcium: 9.7 mg/dL (ref 8.7–10.2)
Chloride: 96 mmol/L (ref 96–106)
Creatinine, Ser: 0.99 mg/dL (ref 0.76–1.27)
GFR calc Af Amer: 99 mL/min/{1.73_m2} (ref >59)
GFR calc non Af Amer: 86 mL/min/{1.73_m2} (ref >59)
Glucose: 83 mg/dL (ref 65–99)
Potassium: 4.7 mmol/L (ref 3.5–5.2)
Sodium: 133 mmol/L — ABNORMAL LOW (ref 134–144)

## 2019-03-17 LAB — CBC
Hematocrit: 37.2 % — ABNORMAL LOW (ref 37.5–51.0)
Hemoglobin: 13.5 g/dL (ref 13.0–17.7)
MCH: 32.9 pg (ref 26.6–33.0)
MCHC: 36.3 g/dL — ABNORMAL HIGH (ref 31.5–35.7)
MCV: 91 fL (ref 79–97)
Platelets: 248 10*3/uL (ref 150–450)
RBC: 4.1 x10E6/uL — ABNORMAL LOW (ref 4.14–5.80)
RDW: 12.5 % (ref 11.6–15.4)
WBC: 5 10*3/uL (ref 3.4–10.8)

## 2019-03-17 LAB — SARS CORONAVIRUS 2 (TAT 6-24 HRS): SARS Coronavirus 2: NEGATIVE

## 2019-03-20 ENCOUNTER — Telehealth: Payer: Self-pay | Admitting: *Deleted

## 2019-03-20 NOTE — Telephone Encounter (Addendum)
Pt contacted pre-catheterization scheduled at [facility] for: Tuesday March 21, 2019 8 AM Verified arrival time and place: [facility]) at: 6 AM   No solid food after midnight prior to cath, clear liquids until 5 AM day of procedure. Contrast allergy: no  Hold: HCTZ- AM of procedure  Except hold medications AM meds can be  taken pre-cath with sip of water including: ASA 81 mg   Confirmed patient has responsible adult to drive home post procedure and observe 24 hours after arriving home: yes  Currently, due to Covid-19 pandemic, only one person will be allowed with patient. Must be the same person for patient's entire stay and will be required to wear a mask. They will be asked to wait in the waiting room for the duration of the patient's stay.  Patients are required to wear a mask when they enter the hospital.      COVID-19 Pre-Screening Questions:  . In the past 7 to 10 days have you had a cough,  shortness of breath, headache, congestion, fever (100 or greater) body aches, chills, sore throat, or sudden loss of taste or sense of smell? no . Have you been around anyone with known Covid 19 in the past 7-10 days? no . Have you been around anyone who is awaiting Covid 19 test results in the past 7 to 10 days? no . Have you been around anyone who has been exposed to Covid 19, or has mentioned symptoms of Covid 19 within the past 7 to 10 days? no  I reviewed procedure/mask/visitor instructions, COVID-19 screening questions with patient, he verbalized understanding, thanked me for call.

## 2019-03-20 NOTE — Telephone Encounter (Signed)
Patient is returning call requesting instructions for catheterrization scheduled for 03/21/19.

## 2019-03-21 ENCOUNTER — Ambulatory Visit (HOSPITAL_COMMUNITY)
Admission: RE | Admit: 2019-03-21 | Discharge: 2019-03-21 | Disposition: A | Discharge status: Home / Self Care | Payer: No Typology Code available for payment source | Attending: Cardiovascular Disease | Admitting: Cardiovascular Disease

## 2019-03-21 ENCOUNTER — Other Ambulatory Visit: Payer: Self-pay

## 2019-03-21 ENCOUNTER — Encounter (HOSPITAL_COMMUNITY): Payer: No Typology Code available for payment source

## 2019-03-21 ENCOUNTER — Encounter (HOSPITAL_COMMUNITY)
Admission: RE | Disposition: A | Discharge status: Home / Self Care | Payer: Self-pay | Source: Home / Self Care | Attending: Cardiovascular Disease

## 2019-03-21 DIAGNOSIS — F17210 Nicotine dependence, cigarettes, uncomplicated: Secondary | ICD-10-CM | POA: Diagnosis not present

## 2019-03-21 DIAGNOSIS — R0609 Other forms of dyspnea: Secondary | ICD-10-CM | POA: Insufficient documentation

## 2019-03-21 DIAGNOSIS — I10 Essential (primary) hypertension: Secondary | ICD-10-CM | POA: Insufficient documentation

## 2019-03-21 DIAGNOSIS — I252 Old myocardial infarction: Secondary | ICD-10-CM | POA: Diagnosis not present

## 2019-03-21 DIAGNOSIS — R079 Chest pain, unspecified: Secondary | ICD-10-CM | POA: Diagnosis not present

## 2019-03-21 DIAGNOSIS — R9439 Abnormal result of other cardiovascular function study: Secondary | ICD-10-CM | POA: Diagnosis not present

## 2019-03-21 DIAGNOSIS — Z79899 Other long term (current) drug therapy: Secondary | ICD-10-CM | POA: Insufficient documentation

## 2019-03-21 DIAGNOSIS — Z8249 Family history of ischemic heart disease and other diseases of the circulatory system: Secondary | ICD-10-CM | POA: Insufficient documentation

## 2019-03-21 DIAGNOSIS — F4310 Post-traumatic stress disorder, unspecified: Secondary | ICD-10-CM | POA: Diagnosis not present

## 2019-03-21 HISTORY — PX: LEFT HEART CATH AND CORONARY ANGIOGRAPHY: CATH118249

## 2019-03-21 SURGERY — LEFT HEART CATH AND CORONARY ANGIOGRAPHY
Anesthesia: LOCAL

## 2019-03-21 MED ORDER — FENTANYL CITRATE (PF) 100 MCG/2ML IJ SOLN
INTRAMUSCULAR | Status: AC
  Filled 2019-03-21: qty 2

## 2019-03-21 MED ORDER — HEPARIN SODIUM (PORCINE) 1000 UNIT/ML IJ SOLN
INTRAMUSCULAR | Status: DC | PRN
  Administered 2019-03-21: 5000 [IU] via INTRAVENOUS

## 2019-03-21 MED ORDER — VERAPAMIL HCL 2.5 MG/ML IV SOLN
INTRAVENOUS | Status: DC | PRN
  Administered 2019-03-21: 10 mL via INTRA_ARTERIAL

## 2019-03-21 MED ORDER — SODIUM CHLORIDE 0.9% FLUSH: 3.0000 mL | INTRAVENOUS | Status: DC | PRN

## 2019-03-21 MED ORDER — SODIUM CHLORIDE 0.9 % IV SOLN: 250.0000 mL | INTRAVENOUS | Status: DC | PRN

## 2019-03-21 MED ORDER — ASPIRIN 81 MG PO CHEW: 81.0000 mg | CHEWABLE_TABLET | ORAL | Status: DC

## 2019-03-21 MED ORDER — ACETAMINOPHEN 325 MG PO TABS: 650.0000 mg | ORAL_TABLET | ORAL | Status: DC | PRN

## 2019-03-21 MED ORDER — IOHEXOL 350 MG/ML SOLN
INTRAVENOUS | Status: DC | PRN
  Administered 2019-03-21: 85 mL via INTRA_ARTERIAL

## 2019-03-21 MED ORDER — LIDOCAINE HCL (PF) 1 % IJ SOLN
INTRAMUSCULAR | Status: AC
  Filled 2019-03-21: qty 30

## 2019-03-21 MED ORDER — VERAPAMIL HCL 2.5 MG/ML IV SOLN
INTRAVENOUS | Status: AC
  Filled 2019-03-21: qty 2

## 2019-03-21 MED ORDER — HEPARIN (PORCINE) IN NACL 1000-0.9 UT/500ML-% IV SOLN
INTRAVENOUS | Status: AC
  Filled 2019-03-21: qty 1000

## 2019-03-21 MED ORDER — SODIUM CHLORIDE 0.9% FLUSH: 3.0000 mL | Freq: Two times a day (BID) | INTRAVENOUS | Status: DC

## 2019-03-21 MED ORDER — ONDANSETRON HCL 4 MG/2ML IJ SOLN: 4.0000 mg | Freq: Four times a day (QID) | INTRAMUSCULAR | Status: DC | PRN

## 2019-03-21 MED ORDER — MIDAZOLAM HCL 2 MG/2ML IJ SOLN
INTRAMUSCULAR | Status: AC
  Filled 2019-03-21: qty 2

## 2019-03-21 MED ORDER — SODIUM CHLORIDE 0.9 % WEIGHT BASED INFUSION: 1.0000 mL/kg/h | INTRAVENOUS | Status: DC

## 2019-03-21 MED ORDER — SODIUM CHLORIDE 0.9 % WEIGHT BASED INFUSION
3.0000 mL/kg/h | INTRAVENOUS | Status: AC
  Administered 2019-03-21: 3 mL/kg/h via INTRAVENOUS

## 2019-03-21 MED ORDER — HYDRALAZINE HCL 20 MG/ML IJ SOLN: 10.0000 mg | INTRAMUSCULAR | Status: DC | PRN

## 2019-03-21 MED ORDER — FENTANYL CITRATE (PF) 100 MCG/2ML IJ SOLN
INTRAMUSCULAR | Status: DC | PRN
  Administered 2019-03-21: 50 ug via INTRAVENOUS
  Administered 2019-03-21: 25 ug via INTRAVENOUS

## 2019-03-21 MED ORDER — SODIUM CHLORIDE 0.9 % IV SOLN: INTRAVENOUS | Status: AC

## 2019-03-21 MED ORDER — HEPARIN SODIUM (PORCINE) 1000 UNIT/ML IJ SOLN
INTRAMUSCULAR | Status: AC
  Filled 2019-03-21: qty 1

## 2019-03-21 MED ORDER — LABETALOL HCL 5 MG/ML IV SOLN: 10.0000 mg | INTRAVENOUS | Status: DC | PRN

## 2019-03-21 MED ORDER — LIDOCAINE HCL (PF) 1 % IJ SOLN
INTRAMUSCULAR | Status: DC | PRN
  Administered 2019-03-21: 2 mL via INTRADERMAL

## 2019-03-21 MED ORDER — HEPARIN (PORCINE) IN NACL 1000-0.9 UT/500ML-% IV SOLN
INTRAVENOUS | Status: DC | PRN
  Administered 2019-03-21 (×2): 500 mL

## 2019-03-21 MED ORDER — MIDAZOLAM HCL 2 MG/2ML IJ SOLN
INTRAMUSCULAR | Status: DC | PRN
  Administered 2019-03-21: 2 mg via INTRAVENOUS
  Administered 2019-03-21: 1 mg via INTRAVENOUS

## 2019-03-21 SURGICAL SUPPLY — 10 items
CATH 5FR JL3.5 JR4 ANG PIG MP (CATHETERS) ×1 IMPLANT
DEVICE RAD COMP TR BAND LRG (VASCULAR PRODUCTS) ×1 IMPLANT
GLIDESHEATH SLEND SS 6F .021 (SHEATH) ×1 IMPLANT
GUIDEWIRE INQWIRE 1.5J.035X260 (WIRE) IMPLANT
INQWIRE 1.5J .035X260CM (WIRE) ×2
KIT HEART LEFT (KITS) ×2 IMPLANT
PACK CARDIAC CATHETERIZATION (CUSTOM PROCEDURE TRAY) ×2 IMPLANT
SYR MEDRAD MARK 7 150ML (SYRINGE) ×2 IMPLANT
TRANSDUCER W/STOPCOCK (MISCELLANEOUS) ×2 IMPLANT
TUBING CIL FLEX 10 FLL-RA (TUBING) ×2 IMPLANT

## 2019-03-21 NOTE — Progress Notes (Signed)
Discharge instructions reviewed with pt and his wife (via telephone) both voice understanding.  

## 2019-03-21 NOTE — Interval H&P Note (Signed)
History and Physical Interval Note:  03/21/2019 8:09 AM  Reginald Walsh  has presented today for surgery, with the diagnosis of chest pain abnormal stress test.  The various methods of treatment have been discussed with the patient and family. After consideration of risks, benefits and other options for treatment, the patient has consented to  Procedure(s): LEFT HEART CATH AND CORONARY ANGIOGRAPHY (N/A) as a surgical intervention.  The patient's history has been reviewed, patient examined, no change in status, stable for surgery.  I have reviewed the patient's chart and labs.  Questions were answered to the patient's satisfaction.    Cath Lab Visit (complete for each Cath Lab visit)  Clinical Evaluation Leading to the Procedure:   ACS: No.  Non-ACS:    Anginal Classification: CCS II  Anti-ischemic medical therapy: Maximal Therapy (2 or more classes of medications)  Non-Invasive Test Results: Low-risk stress test findings: cardiac mortality <1%/year  Prior CABG: No previous CABG        Lauree Chandler

## 2019-03-21 NOTE — Discharge Instructions (Signed)
Radial Site Care  This sheet gives you information about how to care for yourself after your procedure. Your health care provider may also give you more specific instructions. If you have problems or questions, contact your health care provider. What can I expect after the procedure? After the procedure, it is common to have:  Bruising and tenderness at the catheter insertion area. Follow these instructions at home: Medicines  Take over-the-counter and prescription medicines only as told by your health care provider. Insertion site care  Follow instructions from your health care provider about how to take care of your insertion site. Make sure you: ? Wash your hands with soap and water before you change your bandage (dressing). If soap and water are not available, use hand sanitizer. ? Change your dressing as told by your health care provider. ? Leave stitches (sutures), skin glue, or adhesive strips in place. These skin closures may need to stay in place for 2 weeks or longer. If adhesive strip edges start to loosen and curl up, you may trim the loose edges. Do not remove adhesive strips completely unless your health care provider tells you to do that.  Check your insertion site every day for signs of infection. Check for: ? Redness, swelling, or pain. ? Fluid or blood. ? Pus or a bad smell. ? Warmth.  Do not take baths, swim, or use a hot tub until your health care provider approves.  You may shower 24-48 hours after the procedure, or as directed by your health care provider. ? Remove the dressing and gently wash the site with plain soap and water. ? Pat the area dry with a clean towel. ? Do not rub the site. That could cause bleeding.  Do not apply powder or lotion to the site. Activity   For 24 hours after the procedure, or as directed by your health care provider: ? Do not flex or bend the affected arm. ? Do not push or pull heavy objects with the affected arm. ? Do not  drive yourself home from the hospital or clinic. You may drive 24 hours after the procedure unless your health care provider tells you not to. ? Do not operate machinery or power tools.  Do not lift anything that is heavier than 10 lb (4.5 kg), or the limit that you are told, until your health care provider says that it is safe.  Ask your health care provider when it is okay to: ? Return to work or school. ? Resume usual physical activities or sports. ? Resume sexual activity. General instructions  If the catheter site starts to bleed, raise your arm and put firm pressure on the site. If the bleeding does not stop, get help right away. This is a medical emergency.  If you went home on the same day as your procedure, a responsible adult should be with you for the first 24 hours after you arrive home.  Keep all follow-up visits as told by your health care provider. This is important. Contact a health care provider if:  You have a fever.  You have redness, swelling, or yellow drainage around your insertion site. Get help right away if:  You have unusual pain at the radial site.  The catheter insertion area swells very fast.  The insertion area is bleeding, and the bleeding does not stop when you hold steady pressure on the area.  Your arm or hand becomes pale, cool, tingly, or numb. These symptoms may represent a serious problem   that is an emergency. Do not wait to see if the symptoms will go away. Get medical help right away. Call your local emergency services (911 in the [place] Do not drive yourself to the hospital. Summary  After the procedure, it is common to have bruising and tenderness at the site.  Follow instructions from your health care provider about how to take care of your radial site wound. Check the wound every day for signs of infection.  Do not lift anything that is heavier than 10 lb (4.5 kg), or the limit that you are told, until your health care provider says  that it is safe. This information is not intended to replace advice given to you by your health care provider. Make sure you discuss any questions you have with your health care provider. Document Revised: 02/17/2017 Document Reviewed: 02/17/2017 Elsevier Patient Education  2020 Elsevier Inc.  

## 2019-03-21 NOTE — Progress Notes (Signed)
Ambulated in hallway and to bathroom to void tol well 

## 2019-03-21 NOTE — Progress Notes (Signed)
[facility] BAND REMOVAL  LOCATION:    right radial  DEFLATED PER PROTOCOL:    Yes.    TIME BAND OFF / DRESSING APPLIED:    1115   SITE UPON ARRIVAL:    Level 0  SITE AFTER BAND REMOVAL:    Level 0  CIRCULATION SENSATION AND MOVEMENT:    Within Normal Limits   Yes.

## 2019-03-22 ENCOUNTER — Other Ambulatory Visit: Payer: Self-pay | Admitting: Cardiology

## 2019-03-22 DIAGNOSIS — R079 Chest pain, unspecified: Secondary | ICD-10-CM

## 2019-03-27 DIAGNOSIS — I7789 Other specified disorders of arteries and arterioles: Secondary | ICD-10-CM

## 2019-03-27 HISTORY — DX: Other specified disorders of arteries and arterioles: I77.89

## 2019-03-28 ENCOUNTER — Ambulatory Visit: Payer: Self-pay | Admitting: Cardiovascular Disease

## 2019-04-04 ENCOUNTER — Other Ambulatory Visit: Payer: Self-pay

## 2019-04-04 ENCOUNTER — Encounter: Payer: Self-pay | Admitting: Internal Medicine

## 2019-04-04 ENCOUNTER — Ambulatory Visit (INDEPENDENT_AMBULATORY_CARE_PROVIDER_SITE_OTHER): Payer: No Typology Code available for payment source | Admitting: Internal Medicine

## 2019-04-04 VITALS — BP 118/82 | HR 67 | Ht 75.0 in | Wt 231.6 lb

## 2019-04-04 DIAGNOSIS — R0609 Other forms of dyspnea: Secondary | ICD-10-CM

## 2019-04-04 DIAGNOSIS — I10 Essential (primary) hypertension: Secondary | ICD-10-CM | POA: Diagnosis not present

## 2019-04-04 DIAGNOSIS — R0600 Dyspnea, unspecified: Secondary | ICD-10-CM

## 2019-04-04 DIAGNOSIS — R9439 Abnormal result of other cardiovascular function study: Secondary | ICD-10-CM | POA: Diagnosis not present

## 2019-04-04 DIAGNOSIS — R9431 Abnormal electrocardiogram [ECG] [EKG]: Secondary | ICD-10-CM

## 2019-04-04 DIAGNOSIS — R079 Chest pain, unspecified: Secondary | ICD-10-CM

## 2019-04-04 MED ORDER — METOPROLOL SUCCINATE ER 25 MG PO TB24: 25.0000 mg | ORAL_TABLET | Freq: Every day | ORAL | 3 refills | Status: DC

## 2019-04-04 NOTE — Patient Instructions (Signed)
Medication Instructions:  Your physician recommends that you continue on your current medications as directed. Please refer to the Current Medication list given to you today.  *If you need a refill on your cardiac medications before your next appointment, please call your pharmacy*  Lab Work: NONE  Testing/Procedures: Your physician has requested that you have a renal artery duplex. During this test, an ultrasound is used to evaluate blood flow to the kidneys. Allow one hour for this exam. Do not eat after midnight the day before and avoid carbonated beverages. Take your medications as you usually do.  Your physician has requested that you have an echocardiogram. Echocardiography is a painless test that uses sound waves to create images of your heart. It provides your doctor with information about the size and shape of your heart and how well your heart's chambers and valves are working. This procedure takes approximately one hour. There are no restrictions for this procedure. [address]  Follow-Up: At [facility], you and your health needs are our priority.  As part of our continuing mission to provide you with exceptional heart care, we have created designated Provider Care Teams.  These Care Teams include your primary Cardiologist (physician) and Advanced Practice Providers (APPs -  Physician Assistants and Nurse Practitioners) who all work together to provide you with the care you need, when you need it.  We recommend signing up for the patient portal called "MyChart".  Sign up information is provided on this After Visit Summary.  MyChart is used to connect with patients for Virtual Visits (Telemedicine).  Patients are able to view lab/test results, encounter notes, upcoming appointments, etc.  Non-urgent messages can be sent to your provider as well.   To learn more about what you can do with MyChart, go to [email or web address].    Your next appointment:    WITH LUKE K PA 3 WEEKS, VIRTUAL OR IN OFFICE
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
## 2019-04-13 ENCOUNTER — Ambulatory Visit (HOSPITAL_COMMUNITY)
Admission: RE | Admit: 2019-04-13 | Discharge: 2019-04-13 | Disposition: A | Discharge status: Home / Self Care | Payer: No Typology Code available for payment source | Source: Ambulatory Visit | Attending: Internal Medicine | Admitting: Internal Medicine

## 2019-04-13 ENCOUNTER — Other Ambulatory Visit: Payer: Self-pay

## 2019-04-13 DIAGNOSIS — R0600 Dyspnea, unspecified: Secondary | ICD-10-CM | POA: Insufficient documentation

## 2019-04-13 DIAGNOSIS — R0609 Other forms of dyspnea: Secondary | ICD-10-CM

## 2019-04-13 DIAGNOSIS — I10 Essential (primary) hypertension: Secondary | ICD-10-CM | POA: Diagnosis not present

## 2019-04-18 ENCOUNTER — Other Ambulatory Visit: Payer: Self-pay

## 2019-04-18 ENCOUNTER — Ambulatory Visit (HOSPITAL_COMMUNITY): Payer: No Typology Code available for payment source | Attending: Cardiology

## 2019-04-18 DIAGNOSIS — I10 Essential (primary) hypertension: Secondary | ICD-10-CM | POA: Insufficient documentation

## 2019-04-18 DIAGNOSIS — R0600 Dyspnea, unspecified: Secondary | ICD-10-CM | POA: Insufficient documentation

## 2019-04-18 DIAGNOSIS — R0609 Other forms of dyspnea: Secondary | ICD-10-CM

## 2019-04-26 ENCOUNTER — Ambulatory Visit: Payer: No Typology Code available for payment source | Admitting: Cardiology

## 2019-05-08 ENCOUNTER — Encounter: Payer: Self-pay | Admitting: Cardiology

## 2019-05-11 DIAGNOSIS — I712 Thoracic aortic aneurysm, without rupture, unspecified: Secondary | ICD-10-CM

## 2019-05-11 DIAGNOSIS — I77810 Thoracic aortic ectasia: Secondary | ICD-10-CM

## 2019-05-11 MED ORDER — LOSARTAN POTASSIUM 100 MG PO TABS: 100.0000 mg | ORAL_TABLET | Freq: Every day | ORAL | 3 refills | Status: DC

## 2019-05-25 ENCOUNTER — Inpatient Hospital Stay: Admission: RE | Admit: 2019-05-25 | Payer: No Typology Code available for payment source | Source: Ambulatory Visit

## 2019-06-01 ENCOUNTER — Ambulatory Visit (INDEPENDENT_AMBULATORY_CARE_PROVIDER_SITE_OTHER)
Admission: RE | Admit: 2019-06-01 | Discharge: 2019-06-01 | Disposition: A | Discharge status: Home / Self Care | Payer: No Typology Code available for payment source | Source: Ambulatory Visit | Attending: Internal Medicine | Admitting: Internal Medicine

## 2019-06-01 ENCOUNTER — Other Ambulatory Visit: Payer: Self-pay

## 2019-06-01 ENCOUNTER — Ambulatory Visit: Payer: No Typology Code available for payment source | Admitting: Cardiology

## 2019-06-01 DIAGNOSIS — I77810 Thoracic aortic ectasia: Secondary | ICD-10-CM | POA: Diagnosis not present

## 2019-06-01 MED ORDER — IOHEXOL 350 MG/ML SOLN
100.0000 mL | Freq: Once | INTRAVENOUS | Status: AC | PRN
  Administered 2019-06-01: 100 mL via INTRAVENOUS

## 2019-06-02 ENCOUNTER — Ambulatory Visit (INDEPENDENT_AMBULATORY_CARE_PROVIDER_SITE_OTHER): Payer: No Typology Code available for payment source | Admitting: Cardiology

## 2019-06-02 ENCOUNTER — Encounter: Payer: Self-pay | Admitting: Cardiology

## 2019-06-02 DIAGNOSIS — I7789 Other specified disorders of arteries and arterioles: Secondary | ICD-10-CM | POA: Diagnosis not present

## 2019-06-02 DIAGNOSIS — I10 Essential (primary) hypertension: Secondary | ICD-10-CM | POA: Diagnosis not present

## 2019-06-02 DIAGNOSIS — R079 Chest pain, unspecified: Secondary | ICD-10-CM

## 2019-06-02 NOTE — Patient Instructions (Signed)
Medication Instructions:  Your physician recommends that you continue on your current medications as directed. Please refer to the Current Medication list given to you today.  *If you need a refill on your cardiac medications before your next appointment, please call your pharmacy*  Testing/Procedures: Non-Cardiac CT Angiography (CTA), is a special type of CT scan that uses a computer to produce multi-dimensional views of major blood vessels throughout the body. In CT angiography, a contrast material is injected through an IV to help visualize the blood vessels CTA chest/aorta in 1 YEAR  Follow-Up: At [facility], you and your health needs are our priority.  As part of our continuing mission to provide you with exceptional heart care, we have created designated Provider Care Teams.  These Care Teams include your primary Cardiologist (physician) and Advanced Practice Providers (APPs -  Physician Assistants and Nurse Practitioners) who all work together to provide you with the care you need, when you need it.  We recommend signing up for the patient portal called "MyChart".  Sign up information is provided on this After Visit Summary.  MyChart is used to connect with patients for Virtual Visits (Telemedicine).  Patients are able to view lab/test results, encounter notes, upcoming appointments, etc.  Non-urgent messages can be sent to your provider as well.   To learn more about what you can do with MyChart, go to [email or web address].    Your next appointment:   12 month(s)  The format for your next appointment:   In Person  Provider:   You may see Dr. Margaretann Loveless or one of the following Advanced Practice Providers on your designated Care Team:    Rosaria Ferries, PA-C  Jory Sims, DNP, ANP  Cadence Kathlen Mody, NP

## 2019-06-02 NOTE — Assessment & Plan Note (Signed)
40 mm on echo-41mm by CTA May 2021 F/U one year

## 2019-06-02 NOTE — Assessment & Plan Note (Signed)
Normal coronaries after abnormal Myoview Feb 2021 Echo March 2021 showed normal LVF- mild LVH

## 2019-06-02 NOTE — Assessment & Plan Note (Signed)
Normal RA dopplers-B/P controlled

## 2019-06-02 NOTE — Progress Notes (Signed)
Cardiology Office Note:    Date:  06/02/2019   ID:  Reginald Walsh, DOB July 16, 1965, MRN [id number]  PCP:  Patient, No Pcp Per  Cardiologist:  Dr Margaretann Loveless Electrophysiologist:  None   Referring MD: Annetta Maw, MD   Chief Complaint  Patient presents with  . Follow-up    Post angio.  Marland Kitchen Shortness of Breath    History of Present Illness:    Reginald Walsh is a 54 y.o. 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  He did do a tour in [place].  He is followed at the [place].  The patient saw Dr. Margaretann Loveless for evaluation of chest pain in Feb 2021.  Since he retired and moved to the area from [facility] he had gained some weight.  He wanted to start exercising.  he tells me when he lived in [place] he was doing cross fit. The patient described intermittent exertional dull chest pain and some dyspnea on exertion.  He did have an abnormal EKG with inferior Q waves.   The patient had a exercise Myoview 03/14/2019.  This was low risk with an EF of 45 to 54% but there was a basilar inferior and mid inferior defect of medium severity.  he underwent diagnostic cath 03/21/2019 that showed normal coronaries. Echo done 04/18/2019 showed normal LVF, mild LVH, dilated AO root at 72mm.  He had CTA 06/01/2019 that showed 38mm aortic root dilatation.  He is in the office today for follow up and to discuss these findings.  The patient has been doing well since we saw him last.  He started taking his medications for HTN at night and he thinks this works better for him.  His B/P in the office today looks good- 118/80.  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  I told him from our standpoint it was OK for him to ease back into an exercise program but to take it slow and focus on moderate cardio- avoiding heavy weights or strenuous squats. He will need a f/u CTA in a year.  Past Medical History:   Diagnosis Date  . 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 Chest pain of unknown etiology 02/2019   normal coronaries after abnormal Myoview  . Hypertension    normal RA dopplers    Past Surgical History:  Procedure Laterality Date  . LEFT HEART CATH AND CORONARY ANGIOGRAPHY N/A 03/21/2019   Procedure: LEFT HEART CATH AND CORONARY ANGIOGRAPHY;  Surgeon: Burnell Blanks, MD;  Location: [facility];  Service: Cardiovascular;  Laterality: N/A;    Current Medications: Current Meds  Medication Sig  . albuterol (VENTOLIN HFA) 108 (90 Base) MCG/ACT inhaler Inhale 2 puffs into the lungs every 6 (six) hours as needed for wheezing or shortness of breath.  Marland Kitchen amLODipine (NORVASC) 10 MG tablet Take 10 mg by mouth daily.   Marland Kitchen aspirin EC 81 MG tablet Take 1 tablet (81 mg total) by mouth daily.  Marland Kitchen augmented betamethasone dipropionate (DIPROLENE-AF) 0.05 % cream Apply 1 application topically 2 (two) times daily as needed (eczema).  . diclofenac Sodium (VOLTAREN) 1 % GEL Apply 1 application topically daily as needed (pain).  . fluticasone (FLONASE) 50 MCG/ACT nasal spray Place 1 spray into both nostrils daily as needed for allergies.   . hydrochlorothiazide (HYDRODIURIL) 25  MG tablet Take 25 mg by mouth daily.  Marland Kitchen HYDROcodone-acetaminophen (NORCO) 7.5-325 MG tablet Take 1 tablet by mouth every 6 (six) hours as needed for moderate pain.  Marland Kitchen ipratropium-albuterol (DUONEB) 0.5-2.5 (3) MG/3ML SOLN Take 3 mLs by nebulization every 4 (four) hours as needed (shortness of breath).  . losartan (COZAAR) 100 MG tablet Take 1 tablet (100 mg total) by mouth daily.  . metoprolol succinate (TOPROL XL) 25 MG 24 hr tablet Take 1 tablet (25 mg total) by mouth daily.  . montelukast (SINGULAIR) 10 MG tablet Take 10 mg by mouth daily.   . Multiple Vitamins-Minerals (MENS MULTIVITAMIN PLUS PO) Take 1 tablet by mouth daily.   . nitroGLYCERIN (NITROSTAT) 0.4 MG SL tablet Place 1 tablet (0.4 mg  total) under the tongue every 5 (five) minutes as needed for chest pain.  . Omega-3 Fatty Acids (OMEGA 3 PO) Take 1 capsule by mouth daily.  . pantoprazole (PROTONIX) 40 MG tablet Take 40 mg by mouth daily.  . pregabalin (LYRICA) 150 MG capsule Take 150 mg by mouth 2 (two) times daily.   . pseudoephedrine (SUDAFED) 60 MG tablet Take 60 mg by mouth daily as needed for congestion.   . sertraline (ZOLOFT) 50 MG tablet Take 100 mg by mouth daily.   . [DISCONTINUED] ALPRAZolam (XANAX) 0.25 MG tablet Take 1 tablet (0.25 mg total) by mouth 2 (two) times daily as needed for anxiety.     Allergies:   Patient has no known allergies.   Social History   Socioeconomic History  . Marital status: Single    Spouse name: Not on file  . Number of children: Not on file  . Years of education: Not on file  . Highest education level: Not on file  Occupational History  . Not on file  Tobacco Use  . Smoking status: Current Every Day Smoker    Types: E-cigarettes  . Smokeless tobacco: Current User  Substance and Sexual Activity  . Alcohol use: Yes    Comment: occasional  . Drug use: Never  . Sexual activity: Yes  Other Topics Concern  . Not on file  Social History Narrative  . Not on file   Social Determinants of Health   Financial Resource Strain:   . Difficulty of Paying Living Expenses:   Food Insecurity:   . Worried About Charity fundraiser in the Last Year:   . Arboriculturist in the Last Year:   Transportation Needs:   . Film/video editor (Medical):   Marland Kitchen Lack of Transportation (Non-Medical):   Physical Activity:   . Days of Exercise per Week:   . Minutes of Exercise per Session:   Stress:   . Feeling of Stress :   Social Connections:   . Frequency of Communication with Friends and Family:   . Frequency of Social Gatherings with Friends and Family:   . Attends Religious Services:   . Active Member of Clubs or Organizations:   . Attends Archivist Meetings:   Marland Kitchen  Marital Status:      Family History: The patient's family history is not on file. He was adopted.  ROS:   Please see the history of present illness.     All other systems reviewed and are negative.  EKGs/Labs/Other Studies Reviewed:    The following studies were reviewed today: CTA 06/03/19  EKG:  EKG is not ordered today.  The ekg ordered 03/06/2019 demonstrates NSR-80, inferior Qs, LVH  Recent Labs: 03/17/2019: BUN  11; Creatinine, Ser 0.99; Hemoglobin 13.5; Platelets 248; Potassium 4.7; Sodium 133  Recent Lipid Panel    Component Value Date/Time   CHOL 229 (H) 03/17/2019 1116   TRIG 181 (H) 03/17/2019 1116   HDL 80 03/17/2019 1116   CHOLHDL 2.9 03/17/2019 1116   LDLCALC 118 (H) 03/17/2019 1116    Physical Exam:    VS:  BP 118/80 (BP Location: Left Arm, Patient Position: Sitting, Cuff Size: Normal)   Pulse 76   Temp (!) 97.3 F (36.3 C)   Ht 6\' 3"  (1.905 m)   Wt 230 lb (104.3 kg)   BMI 28.75 kg/m     Wt Readings from Last 3 Encounters:  06/02/19 230 lb (104.3 kg)  04/04/19 231 lb 9.6 oz (105.1 kg)  03/21/19 230 lb (104.3 kg)     GEN:  Well nourished, well developed in no acute distress HEENT: Normal NECK: No JVD; No carotid bruits CARDIAC: RRR, no murmurs, rubs, gallops RESPIRATORY:  Clear to auscultation without rales, wheezing or rhonchi  ABDOMEN: Soft, non-tender, non-distended MUSCULOSKELETAL:  No edema; No deformity  SKIN: Warm and dry NEUROLOGIC:  Alert and oriented x 3 PSYCHIATRIC:  Normal affect   ASSESSMENT:    Chest pain of uncertain etiology Normal coronaries after abnormal Myoview Feb 2021 Echo March 2021 showed normal LVF- mild LVH   Essential hypertension Normal RA dopplers-B/P controlled  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[facility]) 40 mm on echo-84mm by CTA May 2021 F/U one year  PLAN:    F/U with PCP at [place].  CTA follow up one year.   Medication Adjustments/Labs and Tests Ordered: Current medicines are reviewed at length with the  patient today.  Concerns regarding medicines are outlined above.  Orders Placed This Encounter  Procedures  . CT ANGIO CHEST AORTA W/CM & OR WO/CM   No orders of the defined types were placed in this encounter.   Patient Instructions  Medication Instructions:  Your physician recommends that you continue on your current medications as directed. Please refer to the Current Medication list given to you today.  *If you need a refill on your cardiac medications before your next appointment, please call your pharmacy*  Testing/Procedures: Non-Cardiac CT Angiography (CTA), is a special type of CT scan that uses a computer to produce multi-dimensional views of major blood vessels throughout the body. In CT angiography, a contrast material is injected through an IV to help visualize the blood vessels CTA chest/aorta in 1 YEAR  Follow-Up: At [facility], you and your health needs are our priority.  As part of our continuing mission to provide you with exceptional heart care, we have created designated Provider Care Teams.  These Care Teams include your primary Cardiologist (physician) and Advanced Practice Providers (APPs -  Physician Assistants and Nurse Practitioners) who all work together to provide you with the care you need, when you need it.  We recommend signing up for the patient portal called "MyChart".  Sign up information is provided on this After Visit Summary.  MyChart is used to connect with patients for Virtual Visits (Telemedicine).  Patients are able to view lab/test results, encounter notes, upcoming appointments, etc.  Non-urgent messages can be sent to your provider as well.   To learn more about what you can do with MyChart, go to [email or web address].    Your next appointment:   12 month(s)  The format for your next appointment:   In Person  Provider:   You may see Dr. Margaretann Loveless or one of  the following Advanced Practice Providers on your designated Care Team:     Rosaria Ferries, PA-C  Jory Sims, DNP, ANP  Cadence Kathlen Mody, NP      Signed, Kerin Ransom, PA-C  06/02/2019 8:49 AM    [place]

## 2019-09-05 ENCOUNTER — Encounter: Payer: Self-pay | Admitting: Medical-Surgical

## 2019-09-05 NOTE — Telephone Encounter (Signed)
Form placed in providers box 

## 2019-09-08 ENCOUNTER — Encounter: Payer: Self-pay | Admitting: Medical-Surgical

## 2019-09-11 ENCOUNTER — Ambulatory Visit: Payer: No Typology Code available for payment source | Admitting: Medical-Surgical

## 2019-09-22 ENCOUNTER — Telehealth: Payer: Self-pay

## 2019-09-22 NOTE — Telephone Encounter (Signed)
Reginald Walsh with [facility] called regarding the surgical medical clearance form. Pt sent 2 separate portal messages regarding this form. He was to schedule an OV to establish with a new PCP for completion and he has not done so. The form is still in Joy's inbox ready for completion when he comes in to establish care with her. [facility] aware of this information. No further questions or concerns at this time.

## 2019-12-06 ENCOUNTER — Other Ambulatory Visit: Payer: Self-pay

## 2019-12-06 ENCOUNTER — Ambulatory Visit (INDEPENDENT_AMBULATORY_CARE_PROVIDER_SITE_OTHER): Payer: Managed Care, Other (non HMO) | Admitting: Physician Assistant

## 2019-12-06 ENCOUNTER — Encounter: Payer: Self-pay | Admitting: Physician Assistant

## 2019-12-06 VITALS — BP 108/70 | HR 80 | Temp 98.4°F | Resp 16 | Ht 75.0 in | Wt 237.0 lb

## 2019-12-06 DIAGNOSIS — F419 Anxiety disorder, unspecified: Secondary | ICD-10-CM | POA: Diagnosis not present

## 2019-12-06 DIAGNOSIS — F32A Depression, unspecified: Secondary | ICD-10-CM | POA: Diagnosis not present

## 2019-12-06 DIAGNOSIS — K635 Polyp of colon: Secondary | ICD-10-CM

## 2019-12-06 DIAGNOSIS — I10 Essential (primary) hypertension: Secondary | ICD-10-CM | POA: Diagnosis not present

## 2019-12-06 DIAGNOSIS — M47812 Spondylosis without myelopathy or radiculopathy, cervical region: Secondary | ICD-10-CM | POA: Insufficient documentation

## 2019-12-06 NOTE — Progress Notes (Signed)
Patient presents to clinic today to establish care.  Patient would like referral to Psychiatry due to history of anxiety/PTSD. Is currently on a regimen of Sertraline 100 mg daily. Is wondering if he is on the right regimen or needs to remain on SSRI. Does not feel this is as effective as it was previously. Has been on numerous medications in the past with failures and side effects. Cannot remember the names of medications. Also significant issue with insomnia -- both sleep onset and maintenance. Rare occurrence of night terror -- seems to be various triggers.   Patient has also has history of colon polyps. Is on a every 3-year schedule for colonoscopies. Last was in 2017. Has had pending appointments through the [place] but keeps being adjusted. Would rather see outside provider. Maintains a good diet and fiber supplement. Also using nature's Balance with good results. No current rectal bleeding.   Chronic Issues: Hypertension -- Is currently on a regimen of amlodipine 10 mg QD, Toprol XL 25 mg,  Losartan 100 mg daily and hydrochlorothiazide 25 mg QD. Endorses taking as directed and tolerating well. Patient denies chest pain, palpitations, lightheadedness, dizziness, vision changes or frequent headaches. Is followed by Cardiology due to history of CAD. Has Rx for SL nitro but has not had to take.   BP Readings from Last 3 Encounters:  12/06/19 108/70  06/02/19 118/80  04/04/19 118/82   Health Maintenance: Immunizations -- will obtain records. Flu and tetanus up-to-date.   Past Medical History:  Diagnosis Date  . 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 Chest pain of unknown etiology 02/2019   normal coronaries after abnormal Myoview  . Hypertension    normal RA dopplers    Past Surgical History:  Procedure Laterality Date  . LEFT HEART CATH AND CORONARY ANGIOGRAPHY N/A 03/21/2019   Procedure: LEFT HEART CATH AND CORONARY ANGIOGRAPHY;  Surgeon: Burnell Blanks,  MD;  Location: [facility];  Service: Cardiovascular;  Laterality: N/A;  . REVISION TOTAL HIP ARTHROPLASTY      Current Outpatient Medications on File Prior to Visit  Medication Sig Dispense Refill  . albuterol (VENTOLIN HFA) 108 (90 Base) MCG/ACT inhaler Inhale 2 puffs into the lungs every 6 (six) hours as needed for wheezing or shortness of breath.    Marland Kitchen amLODipine (NORVASC) 10 MG tablet Take 10 mg by mouth daily.     Marland Kitchen augmented betamethasone dipropionate (DIPROLENE-AF) 0.05 % cream Apply 1 application topically 2 (two) times daily as needed (eczema).    . diclofenac Sodium (VOLTAREN) 1 % GEL Apply 1 application topically daily as needed (pain).    . fluticasone (FLONASE) 50 MCG/ACT nasal spray Place 1 spray into both nostrils daily as needed for allergies.     . hydrochlorothiazide (HYDRODIURIL) 25 MG tablet Take 25 mg by mouth daily.    Marland Kitchen HYDROcodone-acetaminophen (NORCO) 7.5-325 MG tablet Take 1 tablet by mouth every 6 (six) hours as needed for moderate pain.    Marland Kitchen ipratropium-albuterol (DUONEB) 0.5-2.5 (3) MG/3ML SOLN Take 3 mLs by nebulization every 4 (four) hours as needed (shortness of breath).    . losartan (COZAAR) 100 MG tablet Take 1 tablet (100 mg total) by mouth daily. 90 tablet 3  . metoprolol succinate (TOPROL XL) 25 MG 24 hr tablet Take 1 tablet (25 mg total) by mouth daily. 90 tablet 3  . montelukast (SINGULAIR) 10 MG tablet Take 10 mg by mouth daily.     Marland Kitchen  Multiple Vitamins-Minerals (MENS MULTIVITAMIN PLUS PO) Take 1 tablet by mouth daily.     . pantoprazole (PROTONIX) 40 MG tablet Take 40 mg by mouth daily.    . pregabalin (LYRICA) 150 MG capsule Take 150 mg by mouth 2 (two) times daily.     . sertraline (ZOLOFT) 50 MG tablet Take 100 mg by mouth daily.     . nitroGLYCERIN (NITROSTAT) 0.4 MG SL tablet Place 1 tablet (0.4 mg total) under the tongue every 5 (five) minutes as needed for chest pain. (Patient not taking: Reported on 12/06/2019) 15 tablet 3  . Omega-3 Fatty  Acids (OMEGA 3 PO) Take 1 capsule by mouth daily. (Patient not taking: Reported on 12/06/2019)     No current facility-administered medications on file prior to visit.    No Known Allergies  Family History  Adopted: Yes    Social History   Socioeconomic History  . Marital status: Significant Other    Spouse name: Not on file  . Number of children: Not on file  . Years of education: Not on file  . Highest education level: Not on file  Occupational History  . Not on file  Tobacco Use  . Smoking status: Former Research scientist (life sciences)  . Smokeless tobacco: Current User  Vaping Use  . Vaping Use: Never used  Substance and Sexual Activity  . Alcohol use: Yes    Comment: occasional  . Drug use: Never  . Sexual activity: Yes  Other Topics Concern  . Not on file  Social History Narrative  . Not on file   Social Determinants of Health   Financial Resource Strain:   . Difficulty of Paying Living Expenses: Not on file  Food Insecurity:   . Worried About Charity fundraiser in the Last Year: Not on file  . Ran Out of Food in the Last Year: Not on file  Transportation Needs:   . Lack of Transportation (Medical): Not on file  . Lack of Transportation (Non-Medical): Not on file  Physical Activity:   . Days of Exercise per Week: Not on file  . Minutes of Exercise per Session: Not on file  Stress:   . Feeling of Stress : Not on file  Social Connections:   . Frequency of Communication with Friends and Family: Not on file  . Frequency of Social Gatherings with Friends and Family: Not on file  . Attends Religious Services: Not on file  . Active Member of Clubs or Organizations: Not on file  . Attends Archivist Meetings: Not on file  . Marital Status: Not on file  Intimate Partner Violence:   . Fear of Current or Ex-Partner: Not on file  . Emotionally Abused: Not on file  . Physically Abused: Not on file  . Sexually Abused: Not on file   ROS Pertinent ROS are listed in the HPI.    BP 108/70   Pulse 80   Temp 98.4 F (36.9 C) (Temporal)   Resp 16   Ht 6\' 3"  (1.905 m)   Wt 237 lb (107.5 kg)   SpO2 97%   BMI 29.62 kg/m   Physical Exam Vitals reviewed.  Constitutional:      General: He is not in acute distress.    Appearance: He is well-developed. He is not diaphoretic.  HENT:     Head: Normocephalic and atraumatic.     Right Ear: Tympanic membrane, ear canal and external ear normal.     Left Ear: Tympanic membrane, ear canal  and external ear normal.     Nose: Nose normal.     Mouth/Throat:     Pharynx: No posterior oropharyngeal erythema.  Eyes:     Conjunctiva/sclera: Conjunctivae normal.     Pupils: Pupils are equal, round, and reactive to light.  Neck:     Thyroid: No thyromegaly.  Cardiovascular:     Rate and Rhythm: Normal rate and regular rhythm.     Heart sounds: Normal heart sounds.  Pulmonary:     Effort: Pulmonary effort is normal. No respiratory distress.     Breath sounds: Normal breath sounds. No wheezing or rales.  Chest:     Chest wall: No tenderness.  Abdominal:     General: Bowel sounds are normal. There is no distension.     Palpations: Abdomen is soft. There is no mass.     Tenderness: There is no abdominal tenderness. There is no guarding or rebound.  Musculoskeletal:     Cervical back: Neck supple.  Lymphadenopathy:     Cervical: No cervical adenopathy.  Skin:    General: Skin is warm and dry.     Findings: No rash.  Neurological:     Mental Status: He is alert and oriented to person, place, and time.     Cranial Nerves: No cranial nerve deficit.    Assessment/Plan: 1. Essential hypertension BP stable. Asymptomatic. Continue current medication regimen. Follow-up with Cardiology as scheduled.   2. Anxiety and depression Continue current medication regimen. Referral to Psychiatry placed per patient request.  - Ambulatory referral to Psychiatry  3. Polyp of colon, unspecified part of colon, unspecified type Needs  new Gastroenterology. Overdue for repeat colonoscopy. Referral placed.  - Ambulatory referral to Gastroenterology  This visit occurred during the SARS-CoV-2 public health emergency.  Safety protocols were in place, including screening questions prior to the visit, additional usage of staff PPE, and extensive cleaning of exam room while observing appropriate contact time as indicated for disinfecting solutions.     Leeanne Rio, PA-C

## 2019-12-06 NOTE — Patient Instructions (Signed)
Please schedule an appointment for fasting blood work.  Our office will call you with your results unless you have chosen to receive results via MyChart.  If your blood work is normal we will follow-up each year for physicals and as scheduled for chronic medical problems.  If anything is abnormal we will treat accordingly and get you in for a follow-up.  You will be contacted by Gastroenterology for a screening colonoscopy. You will be contacted by behavioral health to discuss PTSD.    It was very nice meeting you today. Welcome to [facility]!

## 2019-12-11 ENCOUNTER — Ambulatory Visit (INDEPENDENT_AMBULATORY_CARE_PROVIDER_SITE_OTHER): Payer: No Typology Code available for payment source

## 2019-12-11 ENCOUNTER — Other Ambulatory Visit: Payer: Self-pay

## 2019-12-11 DIAGNOSIS — Z125 Encounter for screening for malignant neoplasm of prostate: Secondary | ICD-10-CM

## 2019-12-11 DIAGNOSIS — Z114 Encounter for screening for human immunodeficiency virus [HIV]: Secondary | ICD-10-CM

## 2019-12-11 DIAGNOSIS — I10 Essential (primary) hypertension: Secondary | ICD-10-CM

## 2019-12-11 LAB — PSA: PSA: 0.7 ng/mL (ref 0.10–4.00)

## 2019-12-11 LAB — COMPREHENSIVE METABOLIC PANEL
ALT: 23 U/L (ref 0–53)
AST: 21 U/L (ref 0–37)
Albumin: 4.1 g/dL (ref 3.5–5.2)
Alkaline Phosphatase: 85 U/L (ref 39–117)
BUN: 12 mg/dL (ref 6–23)
CO2: 29 mEq/L (ref 19–32)
Calcium: 9.4 mg/dL (ref 8.4–10.5)
Chloride: 97 mEq/L (ref 96–112)
Creatinine, Ser: 0.86 mg/dL (ref 0.40–1.50)
GFR: 97.46 mL/min (ref >60.00)
Glucose, Bld: 80 mg/dL (ref 70–99)
Potassium: 4.4 mEq/L (ref 3.5–5.1)
Sodium: 135 mEq/L (ref 135–145)
Total Bilirubin: 0.4 mg/dL (ref 0.2–1.2)
Total Protein: 6.8 g/dL (ref 6.0–8.3)

## 2019-12-11 LAB — LIPID PANEL
Cholesterol: 217 mg/dL — ABNORMAL HIGH (ref 0–200)
HDL: 84.4 mg/dL (ref >39.00)
LDL Cholesterol: 113 mg/dL — ABNORMAL HIGH (ref 0–99)
NonHDL: 132.45
Total CHOL/HDL Ratio: 3
Triglycerides: 96 mg/dL (ref 0.0–149.0)
VLDL: 19.2 mg/dL (ref 0.0–40.0)

## 2019-12-12 LAB — HIV ANTIBODY (ROUTINE TESTING W REFLEX): HIV 1&2 Ab, 4th Generation: NONREACTIVE

## 2019-12-26 ENCOUNTER — Telehealth (INDEPENDENT_AMBULATORY_CARE_PROVIDER_SITE_OTHER): Payer: Managed Care, Other (non HMO) | Admitting: Family Medicine

## 2019-12-26 DIAGNOSIS — J449 Chronic obstructive pulmonary disease, unspecified: Secondary | ICD-10-CM | POA: Diagnosis not present

## 2019-12-26 DIAGNOSIS — R059 Cough, unspecified: Secondary | ICD-10-CM

## 2019-12-26 DIAGNOSIS — R0981 Nasal congestion: Secondary | ICD-10-CM

## 2019-12-26 MED ORDER — AMOXICILLIN-POT CLAVULANATE 875-125 MG PO TABS
1.0000 | ORAL_TABLET | Freq: Two times a day (BID) | ORAL | 0 refills | Status: DC
Start: 2019-12-26 — End: 2020-06-21

## 2019-12-26 NOTE — Patient Instructions (Signed)
-  I sent the medication(s) we discussed to your pharmacy: Meds ordered this encounter  Medications  . amoxicillin-clavulanate (AUGMENTIN) 875-125 MG tablet    Sig: Take 1 tablet by mouth 2 (two) times daily.    Dispense:  20 tablet    Refill:  0     I hope you are feeling better soon!  Seek in person care promptly if your symptoms worsen, new concerns arise or you are not improving with treatment.  It was nice to meet you today. I help [place] out with telemedicine visits on Tuesdays and Thursdays and am available for visits on those days. If you have any concerns or questions following this visit please schedule a follow up visit with your Primary Care doctor or seek care at a local urgent care clinic to avoid delays in care.   

## 2019-12-26 NOTE — Progress Notes (Signed)
Virtual Visit via Video Note  I connected with Reginald Walsh  on 12/26/19 at  4:20 PM EST by a video enabled telemedicine application and verified that I am speaking with the correct person using two identifiers.  Location patient: home, [place] Location provider:work or home office Persons participating in the virtual visit: patient, provider, wife  I discussed the limitations of evaluation and management by telemedicine and the availability of in person appointments. The patient expressed understanding and agreed to proceed.   HPI:  Acute telemedicine visit for sinus issues: -Onset: about 4-5 days ago, but chronic underlying rhinitis the last few weeks -  now worsening -Symptoms include: nasal congestion, sore throat, low grade temp to 100, mild SOB - has COPD and has been requiring his alb more which helps, sinus discomfort -Denies: CP, wheezing, much cough, HA -no known sick contacts - but went to a thanksgiving gathering -Has tried: combivent, alb, naproxen, nyquil, dayquill -Pertinent past medical history: copd, reports hx of sinusitis and pneumonia -Pertinent medication allergies: -COVID-19 vaccine status: fully vaccinated, has not had the booster, had flu shot -had negative PCR covid test yesterday  ROS: See pertinent positives and negatives per HPI.  Past Medical History:  Diagnosis Date  . 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 Chest pain of unknown etiology 02/2019   normal coronaries after abnormal Myoview  . Hypertension    normal RA dopplers    Past Surgical History:  Procedure Laterality Date  . LEFT HEART CATH AND CORONARY ANGIOGRAPHY N/A 03/21/2019   Procedure: LEFT HEART CATH AND CORONARY ANGIOGRAPHY;  Surgeon: Burnell Blanks, MD;  Location: [facility];  Service: Cardiovascular;  Laterality: N/A;  . LUMBAR LAMINECTOMY    . REVISION TOTAL HIP ARTHROPLASTY    . TENDON RELEASE     May 2018     Current Outpatient Medications:  .   albuterol (VENTOLIN HFA) 108 (90 Base) MCG/ACT inhaler, Inhale 2 puffs into the lungs every 6 (six) hours as needed for wheezing or shortness of breath., Disp: , Rfl:  .  amLODipine (NORVASC) 10 MG tablet, Take 10 mg by mouth daily. , Disp: , Rfl:  .  amoxicillin-clavulanate (AUGMENTIN) 875-125 MG tablet, Take 1 tablet by mouth 2 (two) times daily., Disp: 20 tablet, Rfl: 0 .  augmented betamethasone dipropionate (DIPROLENE-AF) 0.05 % cream, Apply 1 application topically 2 (two) times daily as needed (eczema)., Disp: , Rfl:  .  diclofenac Sodium (VOLTAREN) 1 % GEL, Apply 1 application topically daily as needed (pain)., Disp: , Rfl:  .  fluticasone (FLONASE) 50 MCG/ACT nasal spray, Place 1 spray into both nostrils daily as needed for allergies. , Disp: , Rfl:  .  hydrochlorothiazide (HYDRODIURIL) 25 MG tablet, Take 25 mg by mouth daily., Disp: , Rfl:  .  HYDROcodone-acetaminophen (NORCO) 7.5-325 MG tablet, Take 1 tablet by mouth every 6 (six) hours as needed for moderate pain., Disp: , Rfl:  .  ipratropium-albuterol (DUONEB) 0.5-2.5 (3) MG/3ML SOLN, Take 3 mLs by nebulization every 4 (four) hours as needed (shortness of breath)., Disp: , Rfl:  .  losartan (COZAAR) 100 MG tablet, Take 1 tablet (100 mg total) by mouth daily., Disp: 90 tablet, Rfl: 3 .  metoprolol succinate (TOPROL XL) 25 MG 24 hr tablet, Take 1 tablet (25 mg total) by mouth daily., Disp: 90 tablet, Rfl: 3 .  montelukast (SINGULAIR) 10 MG tablet, Take 10 mg by mouth daily. , Disp: , Rfl:  .  Multiple  Vitamins-Minerals (MENS MULTIVITAMIN PLUS PO), Take 1 tablet by mouth daily. , Disp: , Rfl:  .  nitroGLYCERIN (NITROSTAT) 0.4 MG SL tablet, Place 1 tablet (0.4 mg total) under the tongue every 5 (five) minutes as needed for chest pain. (Patient not taking: Reported on 12/06/2019), Disp: 15 tablet, Rfl: 3 .  Omega-3 Fatty Acids (OMEGA 3 PO), Take 1 capsule by mouth daily. (Patient not taking: Reported on 12/06/2019), Disp: , Rfl:  .   pantoprazole (PROTONIX) 40 MG tablet, Take 40 mg by mouth daily., Disp: , Rfl:  .  pregabalin (LYRICA) 150 MG capsule, Take 150 mg by mouth 2 (two) times daily. , Disp: , Rfl:  .  sertraline (ZOLOFT) 50 MG tablet, Take 100 mg by mouth daily. , Disp: , Rfl:   EXAM:  VITALS per patient if applicable:  GENERAL: alert, oriented, appears well and in no acute distress  HEENT: atraumatic, conjunttiva clear, no obvious abnormalities on inspection of external nose and ears  NECK: normal movements of the head and neck  LUNGS: on inspection no signs of respiratory distress, breathing rate appears normal, no obvious gross SOB, gasping or wheezing  CV: no obvious cyanosis  MS: moves all visible extremities without noticeable abnormality  PSYCH/NEURO: pleasant and cooperative, no obvious depression or anxiety, speech and thought processing grossly intact  ASSESSMENT AND PLAN:  Discussed the following assessment and plan:  Nasal congestion  Cough  Chronic obstructive pulmonary disease, unspecified COPD type ([place])  -we discussed possible serious and likely etiologies, options for evaluation and workup, limitations of telemedicine visit vs in person visit, treatment, treatment risks and precautions. Pt prefers to treat via telemedicine empirically rather than in person at this moment.  Given sinus issues for several weeks, now worsening, query sinusitis versus other.  He opted for empiric treatment with Augmentin 875 twice daily for 7 to 10 days.  Also discussed potential acute viral illnesses, testing options, treatment options, potential complications and precautions.  Continue albuterol as needed.  He prefers to stay away from prednisone due to orthopedic issues unless really needed.  Feels that albuterol has been adequate.  Advised to seek prompt in person care if worsening, new symptoms arise, or if is not improving with treatment. Discussed options for inperson care if PCP office not  available. Did let this patient know that I only do telemedicine on Tuesdays and Thursdays for [place]. Advised to schedule follow up visit with PCP or [facility] if any further questions or concerns to avoid delays in care.   I discussed the assessment and treatment plan with the patient. The patient was provided an opportunity to ask questions and all were answered. The patient agreed with the plan and demonstrated an understanding of the instructions.     Lucretia Kern, DO

## 2019-12-27 ENCOUNTER — Telehealth: Payer: Self-pay

## 2019-12-27 ENCOUNTER — Encounter (HOSPITAL_COMMUNITY): Payer: Self-pay | Admitting: Psychiatry

## 2019-12-27 ENCOUNTER — Telehealth (INDEPENDENT_AMBULATORY_CARE_PROVIDER_SITE_OTHER): Payer: 59 | Admitting: Psychiatry

## 2019-12-27 DIAGNOSIS — F331 Major depressive disorder, recurrent, moderate: Secondary | ICD-10-CM | POA: Diagnosis not present

## 2019-12-27 DIAGNOSIS — F411 Generalized anxiety disorder: Secondary | ICD-10-CM

## 2019-12-27 DIAGNOSIS — F4310 Post-traumatic stress disorder, unspecified: Secondary | ICD-10-CM | POA: Diagnosis not present

## 2019-12-27 MED ORDER — ESCITALOPRAM OXALATE 10 MG PO TABS
10.0000 mg | ORAL_TABLET | Freq: Every day | ORAL | 0 refills | Status: DC
Start: 2019-12-27 — End: 2020-01-18

## 2019-12-27 MED ORDER — TOPIRAMATE 25 MG PO TABS
25.0000 mg | ORAL_TABLET | Freq: Two times a day (BID) | ORAL | 0 refills | Status: DC
Start: 2019-12-27 — End: 2020-01-18
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
## 2019-12-27 NOTE — Telephone Encounter (Signed)
Not needed

## 2020-01-18 ENCOUNTER — Telehealth (HOSPITAL_COMMUNITY): Payer: Self-pay

## 2020-01-18 ENCOUNTER — Other Ambulatory Visit (HOSPITAL_COMMUNITY): Payer: Self-pay | Admitting: Psychiatry

## 2020-01-18 MED ORDER — TOPIRAMATE 25 MG PO TABS: 25.0000 mg | ORAL_TABLET | Freq: Two times a day (BID) | ORAL | 0 refills | Status: DC

## 2020-01-18 NOTE — Telephone Encounter (Signed)
Ok sent 25mg  bid

## 2020-01-18 NOTE — Telephone Encounter (Signed)
[facility] in [facility] sent a fax stating they need you to resend the Topiramate because it does not have any directions on the Sig Code.

## 2020-01-31 ENCOUNTER — Telehealth (HOSPITAL_COMMUNITY): Payer: Self-pay | Admitting: Psychiatry

## 2020-01-31 ENCOUNTER — Telehealth (HOSPITAL_COMMUNITY): Payer: Self-pay

## 2020-01-31 NOTE — Telephone Encounter (Signed)
Patient states he went up to 20mg  Lexapro and is doing well on it. He will call when he needs a refill so that we can send in the 20mg .

## 2020-02-14 ENCOUNTER — Telehealth (INDEPENDENT_AMBULATORY_CARE_PROVIDER_SITE_OTHER): Payer: 59 | Admitting: Psychiatry

## 2020-02-14 ENCOUNTER — Encounter (HOSPITAL_COMMUNITY): Payer: Self-pay | Admitting: Psychiatry

## 2020-02-14 DIAGNOSIS — F411 Generalized anxiety disorder: Secondary | ICD-10-CM

## 2020-02-14 DIAGNOSIS — F331 Major depressive disorder, recurrent, moderate: Secondary | ICD-10-CM

## 2020-02-14 DIAGNOSIS — F4310 Post-traumatic stress disorder, unspecified: Secondary | ICD-10-CM

## 2020-02-14 MED ORDER — ESCITALOPRAM OXALATE 20 MG PO TABS: 20.0000 mg | ORAL_TABLET | Freq: Every day | ORAL | 1 refills | Status: DC
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
## 2020-02-17 ENCOUNTER — Other Ambulatory Visit (HOSPITAL_COMMUNITY): Payer: Self-pay | Admitting: Psychiatry

## 2020-02-28 ENCOUNTER — Telehealth: Payer: Self-pay | Admitting: Physician Assistant

## 2020-02-28 NOTE — Telephone Encounter (Signed)
Patient would like to be referred to another Gastroenterologist - not a Engineer, structural.

## 2020-02-29 NOTE — Telephone Encounter (Signed)
Pt is aware that the referral has been sent to [place] Dr. Collene Mares and Hung's office. He is aware that he will need to get the records and have them sent to the office//ELEA

## 2020-03-13 ENCOUNTER — Other Ambulatory Visit (HOSPITAL_COMMUNITY): Payer: Self-pay | Admitting: Psychiatry

## 2020-03-22 ENCOUNTER — Telehealth (HOSPITAL_COMMUNITY): Payer: 59 | Admitting: Psychiatry

## 2020-03-22 ENCOUNTER — Encounter: Payer: Self-pay | Admitting: Physician Assistant

## 2020-03-25 ENCOUNTER — Encounter: Payer: No Typology Code available for payment source | Admitting: Gastroenterology

## 2020-04-22 ENCOUNTER — Telehealth (HOSPITAL_COMMUNITY): Payer: 59 | Admitting: Psychiatry

## 2020-05-30 ENCOUNTER — Other Ambulatory Visit: Payer: Self-pay | Admitting: Internal Medicine

## 2020-05-30 ENCOUNTER — Telehealth: Payer: Self-pay | Admitting: Internal Medicine

## 2020-05-30 DIAGNOSIS — I712 Thoracic aortic aneurysm, without rupture, unspecified: Secondary | ICD-10-CM

## 2020-05-30 DIAGNOSIS — Z01812 Encounter for preprocedural laboratory examination: Secondary | ICD-10-CM

## 2020-05-30 DIAGNOSIS — I7789 Other specified disorders of arteries and arterioles: Secondary | ICD-10-CM

## 2020-05-30 NOTE — Telephone Encounter (Signed)
Spoke with patent regarding the Friday 06/14/20 9:00am CTA chest/aorta scheduled at [facility]--[address], [address]---arrival time is 8:45 am for check n----liquids only 4 hours prior to study---patient to come n next week for lab work.  Will mail information to patient and it is available in My Chart.  He voiced his understanding.

## 2020-06-03 ENCOUNTER — Encounter: Payer: Self-pay | Admitting: Emergency Medicine

## 2020-06-03 ENCOUNTER — Telehealth: Payer: Self-pay

## 2020-06-03 ENCOUNTER — Telehealth: Payer: Managed Care, Other (non HMO) | Admitting: Emergency Medicine

## 2020-06-03 DIAGNOSIS — M7989 Other specified soft tissue disorders: Secondary | ICD-10-CM

## 2020-06-03 DIAGNOSIS — R0609 Other forms of dyspnea: Secondary | ICD-10-CM

## 2020-06-03 DIAGNOSIS — R0600 Dyspnea, unspecified: Secondary | ICD-10-CM

## 2020-06-03 NOTE — Telephone Encounter (Signed)
Spoke with patient, patient unable to make next available appointment due to needing a morning appointment for work schedule. Made patient an appointment with Almyra Deforest PA-C for next Wednesday 5/18 at 8:45am in office. Spoke with Dr. Margaretann Loveless- patient to have BNP, BMP, and Lipids on Friday of this week. Lab orders placed. Patient made aware, advised patient to call back with any issues, questions, or concerns. Patient verbalized understanding.         Elouise Munroe, MD  [name], PA-C; [place], Belinda Block, RN Rob, will try to get him seen.   Eliezer Lofts, can you see if you can add him to APP or DOD template for someone next avaialble? Will decide on testing based on sx and PE.  GA        Previous Messages   ----- Message -----  From: Delaine Lame  Sent: 06/03/2020  1:53 PM EDT  To: Elouise Munroe, MD  Subject: F/u and imaging request              Dr. Margaretann Loveless,   I'm Lorre Munroe, PA-C with [facility] doing virtual visits today. I just got off the phone with Seward Meth, a patient of yours. He has had some DOE and bilateral LE edema. I think he would benefit from a visit and probably a CXR. Looks like his last echo was about a year ago as well. Outpatient imaging orders are currently beyond the scope of our virtual visits. Is this something you could follow-up with him on? I started him on a few days of lasix and told him to pick up some compression stockings. He was advised to be seen in-person.   Sincerely,  Rob [employer]

## 2020-06-03 NOTE — Progress Notes (Signed)
Reginald Walsh, [place] are scheduled for a virtual visit with your provider today.    Just as we do with appointments in the office, we must obtain your consent to participate.  Your consent will be active for this visit and any virtual visit you may have with one of our providers in the next 365 days.    If you have a MyChart account, I can also send a copy of this consent to you electronically.  All virtual visits are billed to your insurance company just like a traditional visit in the office.  As this is a virtual visit, video technology does not allow for your provider to perform a traditional examination.  This may limit your provider's ability to fully assess your condition.  If your provider identifies any concerns that need to be evaluated in person or the need to arrange testing such as labs, EKG, etc, we will make arrangements to do so.    Although advances in technology are sophisticated, we cannot ensure that it will always work on either your end or our end.  If the connection with a video visit is poor, we may have to switch to a telephone visit.  With either a video or telephone visit, we are not always able to ensure that we have a secure connection.   I need to obtain your verbal consent now.   Are you willing to proceed with your visit today?   [name] has provided verbal consent on 06/03/2020 for a virtual visit (telephone).   [name], PA-C 06/03/2020  1:24 PM     Virtual Visit via Video   I connected with patient on 06/03/20 at  1:30 PM EDT by a Telephone enabled telemedicine application and verified that I am speaking with the correct person using two identifiers.  Location patient: Home Location provider: [place] participating in the virtual visit: Patient, Provider  I discussed the limitations of evaluation and management by telemedicine and the availability of in person appointments. The patient expressed understanding and agreed to  proceed.  Subjective:   HPI:   Patient presents via Reginald Walsh today with chief complaint lower extremity swelling.  He states that the right leg has been worse than the left.  Has had associated SOB.  Increased dyspnea with exertion.  Has been taking lasix periodically.  Last echo was March of last year, EF 50-55%.  ROS:   See pertinent positives and negatives per HPI.  Patient Active Problem List   Diagnosis Date Noted  . Cervical spondylosis 12/06/2019  . Essential hypertension 06/02/2019  . 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[place]) 06/02/2019  . Abnormal stress test   . Chest pain of uncertain etiology   . Cervical radiculopathy 10/21/2016  . Chronic back pain 04/21/2016    Social History   Tobacco Use  . Smoking status: Former Research scientist (life sciences)  . Smokeless tobacco: Current User  Substance Use Topics  . Alcohol use: Yes    Comment: occasional    Current Outpatient Medications:  .  albuterol (VENTOLIN HFA) 108 (90 Base) MCG/ACT inhaler, Inhale 2 puffs into the lungs every 6 (six) hours as needed for wheezing or shortness of breath., Disp: , Rfl:  .  amLODipine (NORVASC) 10 MG tablet, Take 10 mg by mouth daily. , Disp: , Rfl:  .  amoxicillin-clavulanate (AUGMENTIN) 875-125 MG tablet, Take 1 tablet by mouth 2 (two) times daily., Disp: 20 tablet, Rfl: 0 .  augmented betamethasone dipropionate (DIPROLENE-AF) 0.05 % cream, Apply 1  application topically 2 (two) times daily as needed (eczema)., Disp: , Rfl:  .  diclofenac Sodium (VOLTAREN) 1 % GEL, Apply 1 application topically daily as needed (pain)., Disp: , Rfl:  .  escitalopram (LEXAPRO) 20 MG tablet, TAKE 1 TABLET BY MOUTH EVERY DAY, Disp: 90 tablet, Rfl: 0 .  fluticasone (FLONASE) 50 MCG/ACT nasal spray, Place 1 spray into both nostrils daily as needed for allergies. , Disp: , Rfl:  .  hydrochlorothiazide (HYDRODIURIL) 25 MG tablet, Take 25 mg by mouth daily., Disp: , Rfl:  .  HYDROcodone-acetaminophen (NORCO) 7.5-325 MG tablet, Take 1  tablet by mouth every 6 (six) hours as needed for moderate pain., Disp: , Rfl:  .  ipratropium-albuterol (DUONEB) 0.5-2.5 (3) MG/3ML SOLN, Take 3 mLs by nebulization every 4 (four) hours as needed (shortness of breath)., Disp: , Rfl:  .  losartan (COZAAR) 100 MG tablet, Take 1 tablet (100 mg total) by mouth daily., Disp: 90 tablet, Rfl: 3 .  metoprolol succinate (TOPROL XL) 25 MG 24 hr tablet, Take 1 tablet (25 mg total) by mouth daily., Disp: 90 tablet, Rfl: 3 .  montelukast (SINGULAIR) 10 MG tablet, Take 10 mg by mouth daily. , Disp: , Rfl:  .  Multiple Vitamins-Minerals (MENS MULTIVITAMIN PLUS PO), Take 1 tablet by mouth daily. , Disp: , Rfl:  .  nitroGLYCERIN (NITROSTAT) 0.4 MG SL tablet, Place 1 tablet (0.4 mg total) under the tongue every 5 (five) minutes as needed for chest pain. (Patient not taking: Reported on 12/06/2019), Disp: 15 tablet, Rfl: 3 .  Omega-3 Fatty Acids (OMEGA 3 PO), Take 1 capsule by mouth daily. (Patient not taking: Reported on 12/06/2019), Disp: , Rfl:  .  pantoprazole (PROTONIX) 40 MG tablet, Take 40 mg by mouth daily., Disp: , Rfl:  .  pregabalin (LYRICA) 150 MG capsule, Take 150 mg by mouth 2 (two) times daily. , Disp: , Rfl:   No Known Allergies  Objective:   There were no vitals taken for this visit.  Patient is well-developed, well-nourished in no acute distress.  Head is normocephalic, atraumatic.  No labored breathing.  Speech is clear and coherent with logical content.  Patient is alert and oriented at baseline.  Self reports bilateral pedal edema.  Assessment and Plan:   1. Leg swelling  - Lasix 40mg  daily for 5 days  - PCP/Cards follow-up.  Epic message sent to Dr. Margaretann Loveless for f/u and potential imaging.   [name], PA-C 06/03/2020

## 2020-06-07 LAB — BASIC METABOLIC PANEL
BUN/Creatinine Ratio: 10 (ref 9–20)
BUN: 9 mg/dL (ref 6–24)
CO2: 24 mmol/L (ref 20–29)
Calcium: 9.7 mg/dL (ref 8.7–10.2)
Chloride: 91 mmol/L — ABNORMAL LOW (ref 96–106)
Creatinine, Ser: 0.92 mg/dL (ref 0.76–1.27)
Glucose: 93 mg/dL (ref 65–99)
Potassium: 3.8 mmol/L (ref 3.5–5.2)
Sodium: 133 mmol/L — ABNORMAL LOW (ref 134–144)
eGFR: 98 mL/min/{1.73_m2} (ref >59)

## 2020-06-08 LAB — LIPID PANEL
Chol/HDL Ratio: 3.6 ratio (ref 0.0–5.0)
Cholesterol, Total: 254 mg/dL — ABNORMAL HIGH (ref 100–199)
HDL: 71 mg/dL (ref >39)
LDL Chol Calc (NIH): 149 mg/dL — ABNORMAL HIGH (ref 0–99)
Triglycerides: 190 mg/dL — ABNORMAL HIGH (ref 0–149)
VLDL Cholesterol Cal: 34 mg/dL (ref 5–40)

## 2020-06-08 LAB — BRAIN NATRIURETIC PEPTIDE: BNP: 18.3 pg/mL (ref 0.0–100.0)

## 2020-06-12 ENCOUNTER — Ambulatory Visit: Payer: Managed Care, Other (non HMO) | Admitting: Physician Assistant

## 2020-06-12 DIAGNOSIS — I77810 Thoracic aortic ectasia: Secondary | ICD-10-CM

## 2020-06-12 NOTE — Telephone Encounter (Signed)
Please see other [place] Encounter. Appointment has been scheduled.

## 2020-06-14 ENCOUNTER — Inpatient Hospital Stay: Admission: RE | Admit: 2020-06-14 | Payer: Managed Care, Other (non HMO) | Source: Ambulatory Visit

## 2020-06-18 MED ORDER — METOPROLOL SUCCINATE ER 25 MG PO TB24: 25.0000 mg | ORAL_TABLET | Freq: Every day | ORAL | 0 refills | Status: DC

## 2020-06-18 NOTE — Telephone Encounter (Signed)
Reginald Munroe, MD  Livingston Diones, RT Cc: Malon Kindle, Belinda Block, RN Can be rescheduled, ok by me.  [name] let's get an echo instead for now.  GA         Order for Echo placed, message sent to scheduling, and patient aware.

## 2020-06-18 NOTE — Addendum Note (Signed)
Addended by: Rexanne Mano B on: 06/18/2020 04:46 PM   Modules accepted: Orders

## 2020-06-19 ENCOUNTER — Other Ambulatory Visit: Payer: Self-pay

## 2020-06-19 MED ORDER — METOPROLOL SUCCINATE ER 25 MG PO TB24: 25.0000 mg | ORAL_TABLET | Freq: Every day | ORAL | 0 refills | Status: DC

## 2020-06-21 ENCOUNTER — Encounter: Payer: Self-pay | Admitting: Family

## 2020-06-21 ENCOUNTER — Telehealth: Payer: Self-pay | Admitting: *Deleted

## 2020-06-21 ENCOUNTER — Ambulatory Visit: Payer: Managed Care, Other (non HMO) | Admitting: Family

## 2020-06-21 ENCOUNTER — Other Ambulatory Visit: Payer: Self-pay

## 2020-06-21 VITALS — BP 118/78 | HR 80 | Ht 75.0 in | Wt 254.2 lb

## 2020-06-21 DIAGNOSIS — R0600 Dyspnea, unspecified: Secondary | ICD-10-CM

## 2020-06-21 DIAGNOSIS — R600 Localized edema: Secondary | ICD-10-CM

## 2020-06-21 DIAGNOSIS — R0609 Other forms of dyspnea: Secondary | ICD-10-CM

## 2020-06-21 DIAGNOSIS — I10 Essential (primary) hypertension: Secondary | ICD-10-CM

## 2020-06-21 DIAGNOSIS — I77810 Thoracic aortic ectasia: Secondary | ICD-10-CM

## 2020-06-21 LAB — COMPREHENSIVE METABOLIC PANEL
ALT: 25 IU/L (ref 0–44)
AST: 26 IU/L (ref 0–40)
Albumin/Globulin Ratio: 1.8 (ref 1.2–2.2)
Albumin: 4.4 g/dL (ref 3.8–4.9)
Alkaline Phosphatase: 102 IU/L (ref 44–121)
BUN/Creatinine Ratio: 10 (ref 9–20)
BUN: 10 mg/dL (ref 6–24)
Bilirubin Total: 0.6 mg/dL (ref 0.0–1.2)
CO2: 23 mmol/L (ref 20–29)
Calcium: 9.9 mg/dL (ref 8.7–10.2)
Chloride: 90 mmol/L — ABNORMAL LOW (ref 96–106)
Creatinine, Ser: 0.98 mg/dL (ref 0.76–1.27)
Globulin, Total: 2.4 g/dL (ref 1.5–4.5)
Glucose: 82 mg/dL (ref 65–99)
Potassium: 4.2 mmol/L (ref 3.5–5.2)
Sodium: 130 mmol/L — ABNORMAL LOW (ref 134–144)
Total Protein: 6.8 g/dL (ref 6.0–8.5)
eGFR: 91 mL/min/{1.73_m2} (ref >59)

## 2020-06-21 MED ORDER — METOPROLOL SUCCINATE ER 50 MG PO TB24: 50.0000 mg | ORAL_TABLET | Freq: Every day | ORAL | 2 refills | Status: DC

## 2020-06-21 MED ORDER — AMLODIPINE BESYLATE 10 MG PO TABS: 5.0000 mg | ORAL_TABLET | Freq: Every day | ORAL | Status: DC

## 2020-06-21 MED ORDER — HYDROCHLOROTHIAZIDE 50 MG PO TABS: 50.0000 mg | ORAL_TABLET | Freq: Every day | ORAL | 2 refills | Status: DC

## 2020-06-21 NOTE — Telephone Encounter (Signed)
-----   Message from Loel Dubonnet, NP sent at 06/21/2020  4:48 PM EDT ----- Normal kidney function, liver function, potassium. Chloride mildly low and sodium mildly low which can happen in volume overload. Recommend less than 2L of fluid intake per day to prevent excess fluid and continuing current medications. Recommend repeat BMP in 1-2 weeks for monitoring.

## 2020-06-21 NOTE — Telephone Encounter (Signed)
Left detail instruction and results on secure voicemail. Mailed BMP labslip  Any question may call back

## 2020-06-21 NOTE — Progress Notes (Signed)
Office Visit    Patient Name: Reginald Walsh Date of Encounter: 06/21/2020  PCP:  Pcp, No   [facility]  Cardiologist:  Elouise Munroe, MD  Advanced Practice Provider:  No care team member to display Electrophysiologist:  None   Chief Complaint    Reginald Walsh is a 55 y.o. male with a hx of normal coronary arteries by cardiac catheterization 03/2019, hypertension, GERD presents today for edema  Past Medical History    Past Medical History:  Diagnosis Date  . 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[place]) 03/2019   40 mm by echo  . Chest pain of unknown etiology 02/2019   normal coronaries after abnormal Myoview  . Hypertension    normal RA dopplers   Past Surgical History:  Procedure Laterality Date  . LEFT HEART CATH AND CORONARY ANGIOGRAPHY N/A 03/21/2019   Procedure: LEFT HEART CATH AND CORONARY ANGIOGRAPHY;  Surgeon: Burnell Blanks, MD;  Location: [facility];  Service: Cardiovascular;  Laterality: N/A;  . LUMBAR LAMINECTOMY    . REVISION TOTAL HIP ARTHROPLASTY    . TENDON RELEASE     May 2018   Allergies  No Known Allergies  History of Present Illness    Reginald Walsh is a 55 y.o. male with a hx of normal coronary arteries by cardiac catheterization 03/2019, hypertension, GERD last seen 06/02/2019 by Kerin Ransom, PA.  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 He is followed by the [place].   He has followed with Dr. Margaretann Loveless since February 2021 for episode of chest pain.  He had abnormal EKG with inferior Q waves.  Exercise Myoview 03/14/2019 low risk with LVEF 45-54% but has basilar infiltrate and mild inferior defect of medium severity.  Underwent diagnostic cath 03/01/2019 with normal coronaries.  Echocardiogram 04/18/2019 normal LVEF, mild LVH, dilated aortic root at 40 mm.  CTA for follow-up for cystic/21 showed 22 mm aortic root dilation.  He was last seen in follow-up 10/16/2019 doing overall  well from cardiac perspective.  He was recommended for repeat CTA on year.  Contacted the office earlier this month regarding lower extremity edema.  He was seen via virtual visit by PCP 06/03/20 and started on Lasix 40mg  for 5 days.  His CTA was transitioned to echo due to national contrast shortage and for optimal imaging in the setting of edema. Lab work 06/07/20 K 3.8, creatinine 0.92, GFR 98.   He presents today for follow-up. Notes his lower extremity edema has improved but is not yet resolved. Tells me he increased his HCTZ to 50mg  daily. He did take Lasix 40mg  daily when his swelling was at its worst with improvement in edema but did not like effects of this medication as he noted muscle cramps.   He works doing Control and instrumentation engineer. Spends many hours driving in the car and sitting. Has purchased compression stockings and wearing intermittently with improvement.   Reports no shortness of breath at rest. Tells me he felt some "congestion" when his edema was notable but this has improved. Reports no chest pain, pressure, or tightness. No  orthopnea, PND.   EKGs/Labs/Other Studies Reviewed:   The following studies were reviewed today:  Echo 04/18/19  1. Left ventricular ejection fraction, by estimation, is 50 to 55%. The  left ventricle has low normal function. The left ventricle has no regional  wall motion abnormalities. There is mild left ventricular hypertrophy.  Left ventricular diastolic  parameters were normal. The average left ventricular global longitudinal  strain is -18.5 %.   2. Right ventricular systolic function is normal. The right ventricular  size is mildly enlarged. There is normal pulmonary artery systolic  pressure. The estimated right ventricular systolic pressure is 45.3 mmHg.   3. The mitral valve is normal in structure. No evidence of mitral valve  regurgitation.   4. The aortic valve is tricuspid. Aortic valve regurgitation is not  visualized. No aortic  stenosis is present.   5. Aortic dilatation noted. There is dilatation of the ascending aorta  measuring 40 mm.   6. The inferior vena cava is normal in size with greater than 50%  respiratory variability, suggesting right atrial pressure of 3 mmHg.    EKG:  EKG is ordered today.  The ekg ordered today demonstrates NSR 80 bpm with no acute ST/T wave changes.  Recent Labs: 12/11/2019: ALT 23 06/07/2020: BNP 18.3; BUN 9; Creatinine, Ser 0.92; Potassium 3.8; Sodium 133  Recent Lipid Panel    Component Value Date/Time   CHOL 254 (H) 06/07/2020 1052   TRIG 190 (H) 06/07/2020 1052   HDL 71 06/07/2020 1052   CHOLHDL 3.6 06/07/2020 1052   CHOLHDL 3 12/11/2019 0828   VLDL 19.2 12/11/2019 0828   LDLCALC 149 (H) 06/07/2020 1052     Home Medications   Current Meds  Medication Sig  . albuterol (VENTOLIN HFA) 108 (90 Base) MCG/ACT inhaler Inhale 2 puffs into the lungs every 6 (six) hours as needed for wheezing or shortness of breath.  Marland Kitchen augmented betamethasone dipropionate (DIPROLENE-AF) 0.05 % cream Apply 1 application topically 2 (two) times daily as needed (eczema).  . diclofenac Sodium (VOLTAREN) 1 % GEL Apply 1 application topically daily as needed (pain).  . fluticasone (FLONASE) 50 MCG/ACT nasal spray Place 1 spray into both nostrils daily as needed for allergies.   Marland Kitchen HYDROcodone-acetaminophen (NORCO) 7.5-325 MG tablet Take 1 tablet by mouth every 6 (six) hours as needed for moderate pain.  Marland Kitchen ipratropium-albuterol (DUONEB) 0.5-2.5 (3) MG/3ML SOLN Take 3 mLs by nebulization every 4 (four) hours as needed (shortness of breath).  . Multiple Vitamins-Minerals (MENS MULTIVITAMIN PLUS PO) Take 1 tablet by mouth daily.   . Omega-3 Fatty Acids (OMEGA 3 PO) Take 1 capsule by mouth daily.  . pantoprazole (PROTONIX) 40 MG tablet Take 40 mg by mouth daily.  . pregabalin (LYRICA) 150 MG capsule Take 150 mg by mouth 2 (two) times daily.   . [DISCONTINUED] amLODipine (NORVASC) 10 MG tablet Take 10 mg  by mouth daily.   . [DISCONTINUED] hydrochlorothiazide (HYDRODIURIL) 50 MG tablet Take 50 mg by mouth daily.  . [DISCONTINUED] metoprolol succinate (TOPROL-XL) 50 MG 24 hr tablet Take 50 mg by mouth daily. Take with or immediately following a meal.     Review of Systems  All other systems reviewed and are otherwise negative except as noted above.  Physical Exam    VS:  BP 118/78   Pulse 80   Ht 6\' 3"  (1.905 m)   Wt 254 lb 3.2 oz (115.3 kg)   SpO2 95%   BMI 31.77 kg/m  , BMI Body mass index is 31.77 kg/m.  Wt Readings from Last 3 Encounters:  06/21/20 254 lb 3.2 oz (115.3 kg)  12/06/19 237 lb (107.5 kg)  06/02/19 230 lb (104.3 kg)    GEN: Well nourished, well developed, in no acute distress. HEENT: normal. Neck: Supple, no JVD, carotid bruits, or masses. Cardiac:  RRR, no murmurs, rubs, or gallops. No clubbing, cyanosis. Trace pedal edema bilaterally.  Radials/PT 2+ and equal bilaterally.  Respiratory:  Respirations regular and unlabored, clear to auscultation bilaterally. GI: Soft, nontender, nondistended. MS: No deformity or atrophy. Skin: Warm and dry, no rash. Varicose veins to bilateral lower extremity. Neuro:  Strength and sensation are intact. Psych: Normal affect.  Assessment & Plan    1. LE edema / Varicose veins - Improved after short course Lasix and increased dose of HCTZ to 50mg  daily. Noted muscle cramps on Lasix. Renal function, potassium, BNP 06/07/20 normal. CMP today to reassess kidney, liver function and electrolytes. Anticipate multifactorial etiology high dose Amlodipine, venous insufficiency. Reduce Amlodipine to 5mg  daily. Pending BP and edema response could consider further reduction in dose. Continue compression stockings, leg elevation. Upcoming echo already scheduled to assess for heart failure, diastolic dysfunction, or valvular etiology as contributory. If edema persists and echo unrevealing, could consider venous duplex study.   2. HTN - BP well  controlled. Monitoring at home. Losartan previously ineffective per his report. Continue Toprol 50mg  daily, HCTZ 50mg  daily. Paper Rx provided.  Reduce Amlodipine to 5mg  QD due to LE edema. If BP becomes above goal 130/80 could consider further increasing dose of Toprol.  3. Ascending aorta enlargement - Upcoming echocardiogram for monitoring. CT for monitoring deferred due to national contrast shortage. Continue optimal BP control.   Disposition: Follow up in 1 month(s) with Dr. Margaretann Loveless or APP.   Signed, Loel Dubonnet, NP 06/21/2020, 9:35 AM [place]

## 2020-06-21 NOTE — Addendum Note (Signed)
Addended by: Patria Mane A on: 06/21/2020 10:03 AM   Modules accepted: Orders

## 2020-06-21 NOTE — Patient Instructions (Addendum)
Medication Instructions:  Your physician has recommended you make the following change in your medication:   CONTINUE HCTZ 50mg  daily  REDUCE Amlodipine to 5mg  daily *You can use up your 10mg  tablets by splitting them in half*  CONTINUE Toprol 50mg  daily  *If you need a refill on your cardiac medications before your next appointment, please call your pharmacy*   Lab Work: Your provider recommends that you return for lab work today: CMP  If you have labs (blood work) drawn today and your tests are completely normal, you will receive your results only by: Marland Kitchen MyChart Message (if you have MyChart) OR . A paper copy in the mail If you have any lab test that is abnormal or we need to change your treatment, we will call you to review the results.   Testing/Procedures: Your physician has requested that you have an echocardiogram. Echocardiography is a painless test that uses sound waves to create images of your heart. It provides your doctor with information about the size and shape of your heart and how well your heart's chambers and valves are working. This procedure takes approximately one hour. There are no restrictions for this procedure. We will see if we can get this appointment moved up to sooner.    Follow-Up: At [facility], you and your health needs are our priority.  As part of our continuing mission to provide you with exceptional heart care, we have created designated Provider Care Teams.  These Care Teams include your primary Cardiologist (physician) and Advanced Practice Providers (APPs -  Physician Assistants and Nurse Practitioners) who all work together to provide you with the care you need, when you need it.  We recommend signing up for the patient portal called "MyChart".  Sign up information is provided on this After Visit Summary.  MyChart is used to connect with patients for Virtual Visits (Telemedicine).  Patients are able to view lab/test results, encounter notes,  upcoming appointments, etc.  Non-urgent messages can be sent to your provider as well.   To learn more about what you can do with MyChart, go to [email or web address].    Your next appointment:   1 month(s)  The format for your next appointment:   In Person  Provider:   You may see Elouise Munroe, MD or one of the following Advanced Practice Providers on your designated Care Team:    Rosaria Ferries, PA-C  Jory Sims, DNP, ANP  Other Instructions  Heart Healthy Diet Recommendations: A low-salt diet is recommended. Meats should be grilled, baked, or boiled. Avoid fried foods. Focus on lean protein sources like fish or chicken with vegetables and fruits. The American Heart Association is a Microbiologist!  American Heart Association Diet and Lifeystyle Recommendations   Exercise recommendations: The American Heart Association recommends 150 minutes of moderate intensity exercise weekly. Try 30 minutes of moderate intensity exercise 4-5 times per week. This could include walking, jogging, or swimming.  To prevent or reduce lower extremity swelling: . Eat a low salt diet. Salt makes the body hold onto extra fluid which causes swelling. . Sit with legs elevated. For example, in the recliner or on an Kappa.  . Wear knee-high compression stockings during the daytime. Ones labeled 15-20 mmHg provide good compression.

## 2020-06-25 ENCOUNTER — Encounter: Payer: Self-pay | Admitting: Family

## 2020-06-25 DIAGNOSIS — Z5181 Encounter for therapeutic drug level monitoring: Secondary | ICD-10-CM

## 2020-06-25 DIAGNOSIS — I77810 Thoracic aortic ectasia: Secondary | ICD-10-CM

## 2020-06-25 DIAGNOSIS — I10 Essential (primary) hypertension: Secondary | ICD-10-CM

## 2020-06-26 ENCOUNTER — Other Ambulatory Visit: Payer: Self-pay | Admitting: *Deleted

## 2020-06-26 DIAGNOSIS — I10 Essential (primary) hypertension: Secondary | ICD-10-CM

## 2020-06-26 DIAGNOSIS — I77810 Thoracic aortic ectasia: Secondary | ICD-10-CM

## 2020-06-26 DIAGNOSIS — Z5181 Encounter for therapeutic drug level monitoring: Secondary | ICD-10-CM

## 2020-06-28 MED ORDER — HYDROCHLOROTHIAZIDE 50 MG PO TABS: 50.0000 mg | ORAL_TABLET | Freq: Every day | ORAL | 2 refills | Status: DC

## 2020-07-02 LAB — LIPID PANEL
Chol/HDL Ratio: 3.6 ratio (ref 0.0–5.0)
Cholesterol, Total: 253 mg/dL — ABNORMAL HIGH (ref 100–199)
HDL: 70 mg/dL (ref >39)
LDL Chol Calc (NIH): 130 mg/dL — ABNORMAL HIGH (ref 0–99)
Triglycerides: 304 mg/dL — ABNORMAL HIGH (ref 0–149)
VLDL Cholesterol Cal: 53 mg/dL — ABNORMAL HIGH (ref 5–40)

## 2020-07-02 LAB — BASIC METABOLIC PANEL
BUN/Creatinine Ratio: 12 (ref 9–20)
BUN: 11 mg/dL (ref 6–24)
CO2: 23 mmol/L (ref 20–29)
Calcium: 9.5 mg/dL (ref 8.7–10.2)
Chloride: 93 mmol/L — ABNORMAL LOW (ref 96–106)
Creatinine, Ser: 0.94 mg/dL (ref 0.76–1.27)
Glucose: 85 mg/dL (ref 65–99)
Potassium: 4 mmol/L (ref 3.5–5.2)
Sodium: 133 mmol/L — ABNORMAL LOW (ref 134–144)
eGFR: 95 mL/min/{1.73_m2} (ref >59)

## 2020-07-05 ENCOUNTER — Encounter: Payer: Self-pay | Admitting: Family

## 2020-07-05 DIAGNOSIS — I10 Essential (primary) hypertension: Secondary | ICD-10-CM

## 2020-07-05 MED ORDER — HYDROCHLOROTHIAZIDE 50 MG PO TABS: 50.0000 mg | ORAL_TABLET | Freq: Every day | ORAL | 1 refills | Status: DC

## 2020-07-10 MED ORDER — ROSUVASTATIN CALCIUM 20 MG PO TABS: 20.0000 mg | ORAL_TABLET | Freq: Every day | ORAL | 2 refills | Status: DC

## 2020-07-10 NOTE — Addendum Note (Signed)
Addended by: Loel Dubonnet on: 07/10/2020 10:08 AM   Modules accepted: Orders

## 2020-07-12 ENCOUNTER — Ambulatory Visit (HOSPITAL_COMMUNITY): Payer: Managed Care, Other (non HMO) | Attending: Cardiology

## 2020-07-12 ENCOUNTER — Other Ambulatory Visit: Payer: Self-pay

## 2020-07-12 DIAGNOSIS — I77810 Thoracic aortic ectasia: Secondary | ICD-10-CM | POA: Insufficient documentation

## 2020-07-12 LAB — ECHOCARDIOGRAM COMPLETE
Area-P 1/2: 2.01 cm2
S' Lateral: 3.7 cm

## 2020-07-12 MED ORDER — PERFLUTREN LIPID MICROSPHERE
1.0000 mL | INTRAVENOUS | Status: AC | PRN
  Administered 2020-07-12: 4 mL via INTRAVENOUS

## 2020-07-24 ENCOUNTER — Other Ambulatory Visit: Payer: Self-pay

## 2020-07-24 ENCOUNTER — Encounter: Payer: Self-pay | Admitting: Internal Medicine

## 2020-07-24 ENCOUNTER — Ambulatory Visit: Payer: Managed Care, Other (non HMO) | Admitting: Internal Medicine

## 2020-07-24 VITALS — BP 120/84 | HR 72 | Resp 18 | Ht 75.0 in | Wt 249.0 lb

## 2020-07-24 DIAGNOSIS — E785 Hyperlipidemia, unspecified: Secondary | ICD-10-CM | POA: Diagnosis not present

## 2020-07-24 DIAGNOSIS — I10 Essential (primary) hypertension: Secondary | ICD-10-CM

## 2020-07-24 DIAGNOSIS — I77810 Thoracic aortic ectasia: Secondary | ICD-10-CM | POA: Diagnosis not present

## 2020-07-24 DIAGNOSIS — I712 Thoracic aortic aneurysm, without rupture, unspecified: Secondary | ICD-10-CM

## 2020-07-24 DIAGNOSIS — Z79899 Other long term (current) drug therapy: Secondary | ICD-10-CM | POA: Diagnosis not present

## 2020-07-24 NOTE — Progress Notes (Signed)
Cardiology Office Note:    Date:  07/24/2020   ID:  Reginald Walsh, DOB 1965-05-08, MRN [id number]  PCP:  Pcp, No  Cardiologist:  Elouise Munroe, MD  Electrophysiologist:  None   Referring MD: No ref. provider found   Chief Complaint: f/u chest pain and DOE  History of Present Illness:    Reginald Walsh is a 55 y.o. male with a history of HTN and anxiety who presents for follow up of chest pain.  Today, he is feeling fantastic. He reports his edema has resolved, and notes it was a side effect of the amlodipine. He is not taking Lasix at this time, it was only for a 5-day trial. Last week he began crestor.  For activity he does yard work and occasionally suffers from LE muscle cramps. He asks if he would benefit from a magnesium supplement. Lately, he has been on a diet and is working on weight loss.  We discussed his recent Echo (06/2020) that revealed aortic dilatation but was otherwise unremarkable. At this visit, his LDL and triglycerides are elevated. He does not believe he fasted sufficiently prior to his labs.  Currently he does not have a PCP.  He denies any chest pain, shortness of breath, palpitations, or exertional symptoms. No headaches, lightheadedness, or syncope to report. Also has no lower extremity edema, orthopnea or PND.   Past Medical History:  Diagnosis Date   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[place]) 03/2019   40 mm by echo   Chest pain of unknown etiology 02/2019   normal coronaries after abnormal Myoview   Hypertension    normal RA dopplers    Past Surgical History:  Procedure Laterality Date   LEFT HEART CATH AND CORONARY ANGIOGRAPHY N/A 03/21/2019   Procedure: LEFT HEART CATH AND CORONARY ANGIOGRAPHY;  Surgeon: Burnell Blanks, MD;  Location: [facility];  Service: Cardiovascular;  Laterality: N/A;   LUMBAR LAMINECTOMY     REVISION TOTAL HIP ARTHROPLASTY     TENDON RELEASE     May 2018    Current Medications: Current Meds  Medication  Sig   albuterol (VENTOLIN HFA) 108 (90 Base) MCG/ACT inhaler Inhale 2 puffs into the lungs every 6 (six) hours as needed for wheezing or shortness of breath.   augmented betamethasone dipropionate (DIPROLENE-AF) 0.05 % cream Apply 1 application topically 2 (two) times daily as needed (eczema).   Cholecalciferol (VITAMIN D3) 1.25 MG (50000 UT) CAPS Take by mouth. PT ISN'T SURE OF THE DOSAGE   diclofenac Sodium (VOLTAREN) 1 % GEL Apply 1 application topically daily as needed (pain).   fluticasone (FLONASE) 50 MCG/ACT nasal spray Place 1 spray into both nostrils daily as needed for allergies.    hydrochlorothiazide (HYDRODIURIL) 50 MG tablet Take 1 tablet (50 mg total) by mouth daily.   HYDROcodone-acetaminophen (NORCO) 7.5-325 MG tablet Take 1 tablet by mouth every 6 (six) hours as needed for moderate pain.   ipratropium-albuterol (DUONEB) 0.5-2.5 (3) MG/3ML SOLN Take 3 mLs by nebulization every 4 (four) hours as needed (shortness of breath).   metoprolol succinate (TOPROL-XL) 50 MG 24 hr tablet Take 1 tablet (50 mg total) by mouth daily. Take with or immediately following a meal.   Multiple Vitamin (MULTIVITAMIN) tablet Take 1 tablet by mouth daily. PT ISN'T SURE OF THE DOSAGE   Multiple Vitamins-Minerals (MENS MULTIVITAMIN PLUS PO) Take 1 tablet by mouth daily.    Omega-3 Fatty Acids (OMEGA 3 PO) Take 1 capsule by mouth daily.   pantoprazole (  PROTONIX) 40 MG tablet Take 40 mg by mouth daily.   pregabalin (LYRICA) 150 MG capsule Take 150 mg by mouth 2 (two) times daily.    rosuvastatin (CRESTOR) 20 MG tablet Take 1 tablet (20 mg total) by mouth daily.     Allergies:   Patient has no known allergies.   Social History   Socioeconomic History   Marital status: Significant Other    Spouse name: Not on file   Number of children: Not on file   Years of education: Not on file   Highest education level: Not on file  Occupational History   Not on file  Tobacco Use   Smoking status: Former     Pack years: 0.00   Smokeless tobacco: Current  Vaping Use   Vaping Use: Never used  Substance and Sexual Activity   Alcohol use: Yes    Comment: occasional   Drug use: Never   Sexual activity: Yes  Other Topics Concern   Not on file  Social History Narrative   Not on file   Social Determinants of Health   Financial Resource Strain: Not on file  Food Insecurity: Not on file  Transportation Needs: Not on file  Physical Activity: Not on file  Stress: Not on file  Social Connections: Not on file     Family History: The patient's family history includes Lung cancer in his father; Prostate cancer in his father. He was adopted.  ROS:   Please see the history of present illness.    (+) Bilateral LE muscle cramps All other systems reviewed and are negative.  EKGs/Labs/Other Studies Reviewed:    The following studies were reviewed today:  Echo 07/12/2020:  1. Left ventricular ejection fraction, by estimation, is 55 to 60%. The  left ventricle has normal function. The left ventricle has no regional  wall motion abnormalities. The left ventricular internal cavity size was  mildly dilated. Left ventricular  diastolic parameters are consistent with Grade I diastolic dysfunction  (impaired relaxation).   2. Right ventricular systolic function is normal. The right ventricular  size is normal.   3. The mitral valve is normal in structure. No evidence of mitral valve  regurgitation. No evidence of mitral stenosis.   4. The aortic valve is tricuspid. Aortic valve regurgitation is not  visualized. Mild aortic valve sclerosis is present, with no evidence of  aortic valve stenosis.   5. Aortic dilatation noted. There is mild dilatation of the aortic root,  measuring 40 mm. There is mild dilatation of the ascending aorta,  measuring 42 mm.   US Renal Artery Duplex 04/13/2019: Summary:  Largest Aortic Diameter: 2.2 cm     Renal:     Right: Normal size right kidney. Normal right  Resistive Index.         Normal cortical thickness of right kidney. No evidence of         right renal artery stenosis. RRV flow present.  Left:  Normal size of left kidney. Normal left Resistive Index.         Normal cortical thickness of the left kidney. 1-59% stenosis         of the left renal artery. LRV flow present.  Mesenteric:  Normal Celiac artery and Superior Mesenteric artery findings.     Patent IVC.     Technically challenging due to patient's inability to maintain holding  breaths.   LHC 03/21/2019: There is mild left ventricular systolic dysfunction. LV end diastolic pressure is normal.  There is no mitral valve regurgitation. The left ventricular ejection fraction is 45-50% by visual estimate.   1. No angiographic evidence of CAD 2. LVEDP 17 mmHg 3. Mild LV systolic dysfunction with segmental wall motion abnormality   Recommendations: No further ischemic workup.  Exercise Myoview 03/14/2019" The left ventricular ejection fraction is mildly decreased (45-54%). Nuclear stress EF: 48%. Blood pressure demonstrated a normal response to exercise. There was no ST segment deviation noted during stress. No T wave inversion was noted during stress. Defect 1: There is a medium defect of mild severity present in the basal inferior, mid inferior and apical inferior location. This is a low risk study. No prior study for comparison.   Mild fixed inferior defect, did not significantly improve with upright positioning. With extracardiac activity noted in diaphragm, may be attenuation artifact, but cannot exclude mild fixed defect. Low normal EF with mildly hypokinetic base to mid septum.  EKG:  07/24/2020: EKG is not ordered today. 06/21/20: NSR  Recent Labs: 06/07/2020: BNP 18.3 06/21/2020: ALT 25 07/02/2020: BUN 11; Creatinine, Ser 0.94; Potassium 4.0; Sodium 133  Recent Lipid Panel    Component Value Date/Time   CHOL 253 (H) 07/02/2020 0846   TRIG 304 (H) 07/02/2020 0846    HDL 70 07/02/2020 0846   CHOLHDL 3.6 07/02/2020 0846   CHOLHDL 3 12/11/2019 0828   VLDL 19.2 12/11/2019 0828   LDLCALC 130 (H) 07/02/2020 0846    Physical Exam:    VS:  BP 120/84 (BP Location: Left Arm, Patient Position: Sitting, Cuff Size: Normal)   Pulse 72   Resp 18   Ht 6\' 3"  (1.905 m)   Wt 249 lb (112.9 kg)   SpO2 96%   BMI 31.12 kg/m     Wt Readings from Last 5 Encounters:  07/24/20 249 lb (112.9 kg)  06/21/20 254 lb 3.2 oz (115.3 kg)  12/06/19 237 lb (107.5 kg)  06/02/19 230 lb (104.3 kg)  04/04/19 231 lb 9.6 oz (105.1 kg)     Constitutional: No acute distress Eyes: sclera non-icteric, normal conjunctiva and lids ENMT: normal dentition, moist mucous membranes Cardiovascular: regular rhythm, normal rate, no murmurs. S1 and S2 normal. Radial pulses normal bilaterally. No jugular venous distention.  Respiratory: clear to auscultation bilaterally GI : normal bowel sounds, soft and nontender. No distention.   MSK: extremities warm, well perfused. No edema.  NEURO: grossly nonfocal exam, moves all extremities. PSYCH: alert and oriented x 3, normal mood and affect.   ASSESSMENT:    1. Ascending aorta dilatation (HCC)   2. Thoracic aortic aneurysm without rupture ([place])   3. Hyperlipidemia, unspecified hyperlipidemia type   4. Medication management   5. Essential hypertension     PLAN:    Ascending aorta dilation - mild and may be upper limit of normal for BSA. Stable on recent echo. Will plan for CTA which was cancelled this year due to contrast shortage, will perform next year to due stable echo measurements.   Chest pain-no coronary artery disease on cath.  Continue to observe. No recurrence.  Hypertension- BP well controlled on HCTZ, Toprol XL. Stopped amlodipine for LE swelling. Doing well now.   HLD - started on crestor, we will check his labs in 6-12 weeks.   Follow-up in 1 year.  Total time of encounter: 30 minutes total time of encounter, including 18  minutes spent in face-to-face patient care. This time includes coordination of care and counseling regarding above mentioned problem list. Remainder of non-face-to-face time involved  reviewing chart documents/testing relevant to the patient encounter and documentation in the medical record.  Cherlynn Kaiser, MD [place]  [facility]    Medication Adjustments/Labs and Tests Ordered: Current medicines are reviewed at length with the patient today.  Concerns regarding medicines are outlined above.  Orders Placed This Encounter  Procedures   CT ANGIO CHEST AORTA W/ & OR WO/CM & GATING ([place] ONLY)   Lipid panel   Hepatic function panel    No orders of the defined types were placed in this encounter.   Patient Instructions  Medication Instructions:  No Changes In Medications at this time.  *If you need a refill on your cardiac medications before your next appointment, please call your pharmacy*  Lab Work: REPEAT- LIPID/LIVER FUNCTION IN September OF THIS YEAR. YOU WILL RECEIVE LAB SLIP REMINDER IN THE MAIL ABOUT 1 MONTH PRIOR  If you have labs (blood work) drawn today and your tests are completely normal, you will receive your results only by: [place] (if you have MyChart) OR A paper copy in the mail If you have any lab test that is abnormal or we need to change your treatment, we will call you to review the results.  Testing/Procedures: GATED CTA- AORTA: IN ONE YEAR- SOME ONE WILL REACH OUT TO YOU TO SCHEDULE THIS   Follow-Up: At [facility], you and your health needs are our priority.  As part of our continuing mission to provide you with exceptional heart care, we have created designated Provider Care Teams.  These Care Teams include your primary Cardiologist (physician) and Advanced Practice Providers (APPs -  Physician Assistants and Nurse Practitioners) who all work together to provide you with the care you need, when you need it.  Your next appointment:    1 year(s)  The format for your next appointment:   In Person  Provider:   Cherlynn Kaiser, MD    [facility],acting as a scribe for Elouise Munroe, MD.,have documented all relevant documentation on the behalf of Elouise Munroe, MD,as directed by  Elouise Munroe, MD while in the presence of Elouise Munroe, MD.  I, Elouise Munroe, MD, have reviewed all documentation for this visit. The documentation on 07/24/20 for the exam, diagnosis, procedures, and orders are all accurate and complete.

## 2020-07-24 NOTE — Patient Instructions (Signed)
Medication Instructions:  No Changes In Medications at this time.  *If you need a refill on your cardiac medications before your next appointment, please call your pharmacy*  Lab Work: REPEAT- LIPID/LIVER FUNCTION IN September OF THIS YEAR. YOU WILL RECEIVE LAB SLIP REMINDER IN THE MAIL ABOUT 1 MONTH PRIOR  If you have labs (blood work) drawn today and your tests are completely normal, you will receive your results only by: [place] (if you have MyChart) OR A paper copy in the mail If you have any lab test that is abnormal or we need to change your treatment, we will call you to review the results.  Testing/Procedures: GATED CTA- AORTA: IN ONE YEAR- SOME ONE WILL REACH OUT TO YOU TO SCHEDULE THIS   Follow-Up: At [facility], you and your health needs are our priority.  As part of our continuing mission to provide you with exceptional heart care, we have created designated Provider Care Teams.  These Care Teams include your primary Cardiologist (physician) and Advanced Practice Providers (APPs -  Physician Assistants and Nurse Practitioners) who all work together to provide you with the care you need, when you need it.  Your next appointment:   1 year(s)  The format for your next appointment:   In Person  Provider:   Cherlynn Kaiser, MD

## 2020-09-13 ENCOUNTER — Other Ambulatory Visit: Payer: Self-pay

## 2020-09-13 DIAGNOSIS — Z79899 Other long term (current) drug therapy: Secondary | ICD-10-CM

## 2020-09-13 DIAGNOSIS — E785 Hyperlipidemia, unspecified: Secondary | ICD-10-CM

## 2020-09-23 ENCOUNTER — Encounter (HOSPITAL_BASED_OUTPATIENT_CLINIC_OR_DEPARTMENT_OTHER): Payer: Self-pay

## 2020-10-09 ENCOUNTER — Other Ambulatory Visit: Payer: Self-pay | Admitting: Family

## 2020-10-09 DIAGNOSIS — I10 Essential (primary) hypertension: Secondary | ICD-10-CM

## 2020-10-09 MED ORDER — METOPROLOL SUCCINATE ER 50 MG PO TB24: 50.0000 mg | ORAL_TABLET | Freq: Every day | ORAL | 2 refills | Status: DC

## 2020-10-16 ENCOUNTER — Encounter (HOSPITAL_BASED_OUTPATIENT_CLINIC_OR_DEPARTMENT_OTHER): Payer: Self-pay

## 2020-12-09 ENCOUNTER — Encounter (HOSPITAL_BASED_OUTPATIENT_CLINIC_OR_DEPARTMENT_OTHER): Payer: Self-pay

## 2020-12-09 DIAGNOSIS — I10 Essential (primary) hypertension: Secondary | ICD-10-CM

## 2020-12-09 MED ORDER — CARVEDILOL 25 MG PO TABS: 25.0000 mg | ORAL_TABLET | Freq: Two times a day (BID) | ORAL | 11 refills | Status: DC

## 2020-12-09 NOTE — Telephone Encounter (Signed)
Please advise! Thx!

## 2020-12-31 ENCOUNTER — Other Ambulatory Visit: Payer: Self-pay | Admitting: Internal Medicine

## 2021-01-03 ENCOUNTER — Other Ambulatory Visit: Payer: Self-pay

## 2021-01-26 HISTORY — PX: HIP ARTHROPLASTY: SHX981

## 2021-01-27 ENCOUNTER — Encounter (HOSPITAL_BASED_OUTPATIENT_CLINIC_OR_DEPARTMENT_OTHER): Payer: Self-pay

## 2021-01-28 MED ORDER — ROSUVASTATIN CALCIUM 20 MG PO TABS: 20.0000 mg | ORAL_TABLET | Freq: Every day | ORAL | 3 refills | Status: DC

## 2021-02-05 ENCOUNTER — Other Ambulatory Visit: Payer: Self-pay | Admitting: Family

## 2021-02-05 DIAGNOSIS — I10 Essential (primary) hypertension: Secondary | ICD-10-CM

## 2021-03-05 ENCOUNTER — Telehealth: Payer: Self-pay

## 2021-03-05 NOTE — Telephone Encounter (Signed)
° °  Pre-operative Risk Assessment    Patient Name: Reginald Walsh  DOB: 12-Jan-1966 MRN: [id number]      Request for Surgical Clearance    Procedure:   RIGHT TOTAL HIP ARTHROPLASTY   Date of Surgery:  Clearance 04/14/21                                 Surgeon:  DR. MATTHEW OLIN  Surgeon's Group or Practice Name:  [facility] Phone number:  [phone] ATTN. [place] Fax number:  [phone]   Type of Clearance Requested:   - Medical    Type of Anesthesia:  Spinal   Additional requests/questions:    Signed, Zebedee Iba   03/05/2021, 2:12 PM

## 2021-03-05 NOTE — Telephone Encounter (Signed)
° °  Name: Reginald Walsh  DOB: [birth year]  MRN: [id number]   Primary Cardiologist: Elouise Munroe, MD  Chart reviewed as part of pre-operative protocol coverage. Patient was contacted 03/05/2021 in reference to pre-operative risk assessment for pending surgery as outlined below.  [name] was last seen on 06/2020 by Dr. Margaretann Loveless. Primarily followed for HTN and aortic dilatation. Cardiac cath 02/2019 showed no evidence of CAD. Last echo 06/2020 EF 55-60%, grade 1 DD, mild dilation of aortic root and ascending aorta. Rcri 0.4% indicating low CV risk. I reached out to patient for update on how he is doing. The patient affirms he has been doing well without any new cardiac symptoms. Able to achieve over 4 METS without angina or dyspnea per our discussion. Therefore, based on ACC/AHA guidelines, the patient would be at acceptable risk for the planned procedure without further cardiovascular testing. The patient was advised that if he develops new symptoms prior to surgery to contact our office to arrange for a follow-up visit, and he verbalized understanding.  Will route this bundled recommendation to requesting provider via Epic fax function. Please call with questions.   Charlie Pitter, PA-C 03/05/2021, 3:16 PM

## 2021-04-08 ENCOUNTER — Encounter (HOSPITAL_BASED_OUTPATIENT_CLINIC_OR_DEPARTMENT_OTHER): Payer: Self-pay

## 2021-04-08 DIAGNOSIS — I77810 Thoracic aortic ectasia: Secondary | ICD-10-CM

## 2021-04-08 NOTE — Telephone Encounter (Signed)
Sodium not much below normal, this shouldn't change her surgery should it?  ?

## 2021-04-21 ENCOUNTER — Other Ambulatory Visit: Payer: Self-pay

## 2021-04-21 ENCOUNTER — Encounter (HOSPITAL_COMMUNITY): Payer: Self-pay

## 2021-04-21 ENCOUNTER — Inpatient Hospital Stay (HOSPITAL_COMMUNITY)
Admission: EM | Admit: 2021-04-21 | Discharge: 2021-04-23 | DRG: 641 | Disposition: A | Discharge status: Home / Self Care | Payer: No Typology Code available for payment source | Attending: Internal Medicine | Admitting: Internal Medicine

## 2021-04-21 DIAGNOSIS — E785 Hyperlipidemia, unspecified: Secondary | ICD-10-CM | POA: Diagnosis present

## 2021-04-21 DIAGNOSIS — D519 Vitamin B12 deficiency anemia, unspecified: Secondary | ICD-10-CM | POA: Diagnosis present

## 2021-04-21 DIAGNOSIS — M549 Dorsalgia, unspecified: Secondary | ICD-10-CM | POA: Diagnosis present

## 2021-04-21 DIAGNOSIS — T502X5A Adverse effect of carbonic-anhydrase inhibitors, benzothiadiazides and other diuretics, initial encounter: Secondary | ICD-10-CM | POA: Diagnosis present

## 2021-04-21 DIAGNOSIS — Z720 Tobacco use: Secondary | ICD-10-CM

## 2021-04-21 DIAGNOSIS — E871 Hypo-osmolality and hyponatremia: Secondary | ICD-10-CM | POA: Diagnosis not present

## 2021-04-21 DIAGNOSIS — K219 Gastro-esophageal reflux disease without esophagitis: Secondary | ICD-10-CM | POA: Insufficient documentation

## 2021-04-21 DIAGNOSIS — F331 Major depressive disorder, recurrent, moderate: Secondary | ICD-10-CM | POA: Insufficient documentation

## 2021-04-21 DIAGNOSIS — E669 Obesity, unspecified: Secondary | ICD-10-CM | POA: Diagnosis present

## 2021-04-21 DIAGNOSIS — G8929 Other chronic pain: Secondary | ICD-10-CM | POA: Diagnosis present

## 2021-04-21 DIAGNOSIS — Z79899 Other long term (current) drug therapy: Secondary | ICD-10-CM

## 2021-04-21 DIAGNOSIS — I7789 Other specified disorders of arteries and arterioles: Secondary | ICD-10-CM | POA: Diagnosis present

## 2021-04-21 DIAGNOSIS — Z6830 Body mass index (BMI) 30.0-30.9, adult: Secondary | ICD-10-CM

## 2021-04-21 DIAGNOSIS — F4312 Post-traumatic stress disorder, chronic: Secondary | ICD-10-CM | POA: Diagnosis present

## 2021-04-21 DIAGNOSIS — E876 Hypokalemia: Secondary | ICD-10-CM | POA: Diagnosis present

## 2021-04-21 DIAGNOSIS — J449 Chronic obstructive pulmonary disease, unspecified: Secondary | ICD-10-CM | POA: Diagnosis present

## 2021-04-21 DIAGNOSIS — Z96641 Presence of right artificial hip joint: Secondary | ICD-10-CM | POA: Diagnosis present

## 2021-04-21 DIAGNOSIS — I10 Essential (primary) hypertension: Secondary | ICD-10-CM

## 2021-04-21 DIAGNOSIS — D638 Anemia in other chronic diseases classified elsewhere: Secondary | ICD-10-CM | POA: Diagnosis present

## 2021-04-21 LAB — CBC WITH DIFFERENTIAL/PLATELET
Abs Immature Granulocytes: 0.08 10*3/uL — ABNORMAL HIGH (ref 0.00–0.07)
Basophils Absolute: 0 10*3/uL (ref 0.0–0.1)
Basophils Relative: 0 %
Eosinophils Absolute: 0.2 10*3/uL (ref 0.0–0.5)
Eosinophils Relative: 3 %
HCT: 29.7 % — ABNORMAL LOW (ref 39.0–52.0)
Hemoglobin: 10.6 g/dL — ABNORMAL LOW (ref 13.0–17.0)
Immature Granulocytes: 1 %
Lymphocytes Relative: 27 %
Lymphs Abs: 1.5 10*3/uL (ref 0.7–4.0)
MCH: 31 pg (ref 26.0–34.0)
MCHC: 35.7 g/dL (ref 30.0–36.0)
MCV: 86.8 fL (ref 80.0–100.0)
Monocytes Absolute: 0.6 10*3/uL (ref 0.1–1.0)
Monocytes Relative: 11 %
Neutro Abs: 3.2 10*3/uL (ref 1.7–7.7)
Neutrophils Relative %: 58 %
Platelets: 341 10*3/uL (ref 150–400)
RBC: 3.42 MIL/uL — ABNORMAL LOW (ref 4.22–5.81)
RDW: 12.8 % (ref 11.5–15.5)
WBC: 5.6 10*3/uL (ref 4.0–10.5)
nRBC: 0 % (ref 0.0–0.2)

## 2021-04-21 LAB — HEPATIC FUNCTION PANEL
ALT: 18 U/L (ref 0–44)
AST: 25 U/L (ref 15–41)
Albumin: 3.5 g/dL (ref 3.5–5.0)
Alkaline Phosphatase: 75 U/L (ref 38–126)
Bilirubin, Direct: 0.2 mg/dL (ref 0.0–0.2)
Indirect Bilirubin: 0.7 mg/dL (ref 0.3–0.9)
Total Bilirubin: 0.9 mg/dL (ref 0.3–1.2)
Total Protein: 6.7 g/dL (ref 6.5–8.1)

## 2021-04-21 LAB — NA AND K (SODIUM & POTASSIUM), RAND UR
Potassium Urine: 23 mmol/L
Sodium, Ur: 12 mmol/L

## 2021-04-21 LAB — BASIC METABOLIC PANEL
Anion gap: 11 (ref 5–15)
BUN: 9 mg/dL (ref 6–20)
CO2: 29 mmol/L (ref 22–32)
Calcium: 9.2 mg/dL (ref 8.9–10.3)
Chloride: 86 mmol/L — ABNORMAL LOW (ref 98–111)
Creatinine, Ser: 0.8 mg/dL (ref 0.61–1.24)
GFR, Estimated: >60 mL/min (ref >60)
Glucose, Bld: 99 mg/dL (ref 70–99)
Potassium: 2.8 mmol/L — ABNORMAL LOW (ref 3.5–5.1)
Sodium: 126 mmol/L — ABNORMAL LOW (ref 135–145)

## 2021-04-21 LAB — MAGNESIUM: Magnesium: 1.6 mg/dL — ABNORMAL LOW (ref 1.7–2.4)

## 2021-04-21 LAB — TSH: TSH: 1.071 u[IU]/mL (ref 0.350–4.500)

## 2021-04-21 LAB — OSMOLALITY, URINE: Osmolality, Ur: 157 mOsm/kg — ABNORMAL LOW (ref 300–900)

## 2021-04-21 MED ORDER — CARVEDILOL 25 MG PO TABS
25.0000 mg | ORAL_TABLET | Freq: Two times a day (BID) | ORAL | Status: DC
  Administered 2021-04-21 – 2021-04-23 (×4): 25 mg via ORAL
  Filled 2021-04-21 (×4): qty 1

## 2021-04-21 MED ORDER — ONDANSETRON HCL 4 MG/2ML IJ SOLN
4.0000 mg | Freq: Four times a day (QID) | INTRAMUSCULAR | Status: DC | PRN
  Administered 2021-04-21: 4 mg via INTRAVENOUS
  Filled 2021-04-21: qty 2

## 2021-04-21 MED ORDER — FLUTICASONE PROPIONATE 50 MCG/ACT NA SUSP
2.0000 | Freq: Every day | NASAL | Status: DC
  Administered 2021-04-22 – 2021-04-23 (×2): 2 via NASAL
  Filled 2021-04-21 (×2): qty 16

## 2021-04-21 MED ORDER — METHOCARBAMOL 500 MG PO TABS
500.0000 mg | ORAL_TABLET | Freq: Four times a day (QID) | ORAL | Status: DC | PRN
  Administered 2021-04-21 – 2021-04-23 (×3): 500 mg via ORAL
  Filled 2021-04-21 (×3): qty 1

## 2021-04-21 MED ORDER — NITROGLYCERIN 0.4 MG SL SUBL: 0.4000 mg | SUBLINGUAL_TABLET | SUBLINGUAL | Status: DC | PRN

## 2021-04-21 MED ORDER — IPRATROPIUM-ALBUTEROL 0.5-2.5 (3) MG/3ML IN SOLN
3.0000 mL | Freq: Four times a day (QID) | RESPIRATORY_TRACT | Status: DC
  Administered 2021-04-21: 3 mL via RESPIRATORY_TRACT
  Filled 2021-04-21: qty 3

## 2021-04-21 MED ORDER — POTASSIUM CHLORIDE 10 MEQ/100ML IV SOLN
10.0000 meq | INTRAVENOUS | Status: AC
  Administered 2021-04-21 (×3): 10 meq via INTRAVENOUS
  Filled 2021-04-21 (×3): qty 100

## 2021-04-21 MED ORDER — ASPIRIN EC 81 MG PO TBEC
81.0000 mg | DELAYED_RELEASE_TABLET | Freq: Every day | ORAL | Status: DC
  Administered 2021-04-22 – 2021-04-23 (×2): 81 mg via ORAL
  Filled 2021-04-21 (×2): qty 1

## 2021-04-21 MED ORDER — ROSUVASTATIN CALCIUM 10 MG PO TABS
20.0000 mg | ORAL_TABLET | Freq: Every day | ORAL | Status: DC
  Administered 2021-04-22 – 2021-04-23 (×2): 20 mg via ORAL
  Filled 2021-04-21 (×2): qty 2

## 2021-04-21 MED ORDER — POTASSIUM CHLORIDE CRYS ER 20 MEQ PO TBCR
40.0000 meq | EXTENDED_RELEASE_TABLET | Freq: Once | ORAL | Status: AC
  Administered 2021-04-21: 40 meq via ORAL
  Filled 2021-04-21: qty 2

## 2021-04-21 MED ORDER — POLYVINYL ALCOHOL 1.4 % OP SOLN
1.0000 [drp] | Freq: Four times a day (QID) | OPHTHALMIC | Status: DC | PRN
  Filled 2021-04-21: qty 15

## 2021-04-21 MED ORDER — OXYCODONE-ACETAMINOPHEN 5-325 MG PO TABS
2.0000 | ORAL_TABLET | Freq: Once | ORAL | Status: AC
  Administered 2021-04-21: 2 via ORAL
  Filled 2021-04-21: qty 2

## 2021-04-21 MED ORDER — PANTOPRAZOLE SODIUM 40 MG PO TBEC
40.0000 mg | DELAYED_RELEASE_TABLET | Freq: Every day | ORAL | Status: DC
  Administered 2021-04-22 – 2021-04-23 (×2): 40 mg via ORAL
  Filled 2021-04-21 (×2): qty 1

## 2021-04-21 MED ORDER — DULOXETINE HCL 30 MG PO CPEP
60.0000 mg | ORAL_CAPSULE | Freq: Every day | ORAL | Status: DC
  Administered 2021-04-22 – 2021-04-23 (×2): 60 mg via ORAL
  Filled 2021-04-21 (×2): qty 2

## 2021-04-21 MED ORDER — PREGABALIN 75 MG PO CAPS
150.0000 mg | ORAL_CAPSULE | Freq: Two times a day (BID) | ORAL | Status: DC
  Administered 2021-04-21 – 2021-04-23 (×4): 150 mg via ORAL
  Filled 2021-04-21 (×4): qty 2

## 2021-04-21 MED ORDER — ONDANSETRON HCL 4 MG/2ML IJ SOLN
4.0000 mg | Freq: Once | INTRAMUSCULAR | Status: AC
  Administered 2021-04-21: 4 mg via INTRAVENOUS
  Filled 2021-04-21: qty 2

## 2021-04-21 MED ORDER — MORPHINE SULFATE (PF) 2 MG/ML IV SOLN
1.0000 mg | Freq: Once | INTRAVENOUS | Status: AC | PRN
  Administered 2021-04-21: 1 mg via INTRAVENOUS
  Filled 2021-04-21: qty 1

## 2021-04-21 MED ORDER — SODIUM CHLORIDE 0.9 % IV BOLUS
1000.0000 mL | Freq: Once | INTRAVENOUS | Status: AC
Start: 2021-04-21 — End: 2021-04-21
  Administered 2021-04-21: 1000 mL via INTRAVENOUS

## 2021-04-21 MED ORDER — ACETAMINOPHEN 650 MG RE SUPP
650.0000 mg | Freq: Four times a day (QID) | RECTAL | Status: DC | PRN
Start: 2021-04-21 — End: 2021-04-23

## 2021-04-21 MED ORDER — OXYCODONE HCL 5 MG PO TABS
10.0000 mg | ORAL_TABLET | Freq: Four times a day (QID) | ORAL | Status: DC | PRN
  Administered 2021-04-21 – 2021-04-22 (×3): 10 mg via ORAL
  Filled 2021-04-21 (×3): qty 2

## 2021-04-21 MED ORDER — ONDANSETRON HCL 4 MG PO TABS: 4.0000 mg | ORAL_TABLET | Freq: Four times a day (QID) | ORAL | Status: DC | PRN

## 2021-04-21 MED ORDER — POTASSIUM CHLORIDE IN NACL 20-0.9 MEQ/L-% IV SOLN
INTRAVENOUS | Status: DC
  Filled 2021-04-21 (×5): qty 1000

## 2021-04-21 MED ORDER — ALBUTEROL SULFATE (2.5 MG/3ML) 0.083% IN NEBU: 3.0000 mL | INHALATION_SOLUTION | Freq: Four times a day (QID) | RESPIRATORY_TRACT | Status: DC | PRN

## 2021-04-21 MED ORDER — IPRATROPIUM-ALBUTEROL 0.5-2.5 (3) MG/3ML IN SOLN
3.0000 mL | Freq: Every day | RESPIRATORY_TRACT | Status: DC
  Administered 2021-04-22 – 2021-04-23 (×2): 3 mL via RESPIRATORY_TRACT
  Filled 2021-04-21 (×2): qty 3

## 2021-04-21 MED ORDER — MAGNESIUM SULFATE 2 GM/50ML IV SOLN
2.0000 g | Freq: Once | INTRAVENOUS | Status: AC
  Administered 2021-04-21: 2 g via INTRAVENOUS
  Filled 2021-04-21: qty 50

## 2021-04-21 MED ORDER — ACETAMINOPHEN 325 MG PO TABS
650.0000 mg | ORAL_TABLET | Freq: Four times a day (QID) | ORAL | Status: DC | PRN
  Administered 2021-04-21 – 2021-04-23 (×2): 650 mg via ORAL
  Filled 2021-04-21 (×2): qty 2

## 2021-04-21 NOTE — ED Triage Notes (Signed)
Patient states he just left the [place] and states his sodium level -120, K+-2.3. ?Patient states he is 7 days post op for a right hip replacement. ?

## 2021-04-21 NOTE — ED Provider Notes (Addendum)
?[facility] DEPT ?Provider Note ? ? ?CSN: [id number] ?Arrival date & time: 04/21/21  8676 ? ?  ? ?History ? ?Chief Complaint  ?Patient presents with  ? Abnormal Lab  ? ? ?Reginald Walsh is a 56 y.o. male. ? ?HPI ? ?56 year old male with medical history significant for hypertension on hydrochlorothiazide, recent right hip replacement 7 days postop who presents to the emergency department from the [place] after he was found to have hyponatremia and hypokalemia on screening laboratory work-up.  The patient was on hydrochlorothiazide prior to his surgery and his sodium was notably low to 127 and the medication was stopped and he proceeded with surgery.  Postop, he has been doing well from a pain control standpoint.  He was evaluated via normal screening labs postoperatively and was found to have significantly decreased sodium and potassium.  He denies any change in his mental status (confirmed by the his wife bedside) and denies any other symptoms at this time.  No seizures.  ? ?Home Medications ?Prior to Admission medications   ?Medication Sig Start Date End Date Taking? Authorizing Provider  ?albuterol (VENTOLIN HFA) 108 (90 Base) MCG/ACT inhaler Inhale 2 puffs into the lungs every 6 (six) hours as needed for wheezing or shortness of breath.    [provider]  ?augmented betamethasone dipropionate (DIPROLENE-AF) 0.05 % cream Apply 1 application topically 2 (two) times daily as needed (eczema).    [provider]  ?carvedilol (COREG) 25 MG tablet Take 1 tablet (25 mg total) by mouth 2 (two) times daily. 12/09/20 12/04/21  Loel Dubonnet, NP  ?Cholecalciferol (VITAMIN D3) 1.25 MG (50000 UT) CAPS Take by mouth. PT ISN'T SURE OF THE DOSAGE    [provider]  ?diclofenac Sodium (VOLTAREN) 1 % GEL Apply 1 application topically daily as needed (pain).    [provider]  ?fluticasone (FLONASE) 50 MCG/ACT nasal spray Place 1 spray into both nostrils daily as  needed for allergies.     [provider]  ?hydrochlorothiazide (HYDRODIURIL) 50 MG tablet TAKE 1 TABLET BY MOUTH EVERY DAY 02/05/21   Loel Dubonnet, NP  ?HYDROcodone-acetaminophen (NORCO) 7.5-325 MG tablet Take 1 tablet by mouth every 6 (six) hours as needed for moderate pain.    [provider]  ?ipratropium-albuterol (DUONEB) 0.5-2.5 (3) MG/3ML SOLN Take 3 mLs by nebulization every 4 (four) hours as needed (shortness of breath).    [provider]  ?Multiple Vitamin (MULTIVITAMIN) tablet Take 1 tablet by mouth daily. PT ISN'T SURE OF THE DOSAGE    [provider]  ?Multiple Vitamins-Minerals (MENS MULTIVITAMIN PLUS PO) Take 1 tablet by mouth daily.     [provider]  ?nitroGLYCERIN (NITROSTAT) 0.4 MG SL tablet Place 1 tablet (0.4 mg total) under the tongue every 5 (five) minutes as needed for chest pain. ?Patient not taking: Reported on 12/06/2019 03/16/19 06/14/19  Erlene Quan, PA-C  ?Omega-3 Fatty Acids (OMEGA 3 PO) Take 1 capsule by mouth daily.    [provider]  ?pantoprazole (PROTONIX) 40 MG tablet Take 40 mg by mouth daily.    [provider]  ?pregabalin (LYRICA) 150 MG capsule Take 150 mg by mouth 2 (two) times daily.     [provider]  ?rosuvastatin (CRESTOR) 20 MG tablet Take 1 tablet (20 mg total) by mouth daily. 01/28/21   Loel Dubonnet, NP  ?sertraline (ZOLOFT) 50 MG tablet Take 100 mg by mouth daily.   02/14/20  [provider]  ?  topiramate (TOPAMAX) 25 MG tablet Take 1 tablet (25 mg total) by mouth 2 (two) times daily. Start one at night for 4 days and then start taking one twice a day 01/18/20 02/14/20  [name], MD  ?   ? ?Allergies    ?Patient has no known allergies.   ? ?Review of Systems   ?Review of Systems  ?All other systems reviewed and are negative. ? ?Physical Exam ?Updated Vital Signs ?BP 124/75   Pulse 67   Temp 97.9 ?F (36.6 ?C) (Oral)   Resp 11   Ht '6\' 3"'$  (1.905 m)   Wt 108.9 kg    SpO2 92%   BMI 30.00 kg/m?  ?Physical Exam ?Vitals and nursing note reviewed.  ?Constitutional:   ?   General: He is not in acute distress. ?   Appearance: He is well-developed.  ?HENT:  ?   Head: Normocephalic and atraumatic.  ?Eyes:  ?   Conjunctiva/sclera: Conjunctivae normal.  ?Cardiovascular:  ?   Rate and Rhythm: Normal rate and regular rhythm.  ?   Comments: 2+ DP pulses bilaterally ?Pulmonary:  ?   Effort: Pulmonary effort is normal. No respiratory distress.  ?   Breath sounds: Normal breath sounds.  ?Abdominal:  ?   Palpations: Abdomen is soft.  ?   Tenderness: There is no abdominal tenderness.  ?Musculoskeletal:     ?   General: Swelling present.  ?   Cervical back: Neck supple.  ?   Comments: Postoperative surgical changes with postoperative surgical dressing in place in the right leg, swelling noted, no erythema, minimal tenderness  ?Skin: ?   General: Skin is warm and dry.  ?   Capillary Refill: Capillary refill takes less than 2 seconds.  ?Neurological:  ?   Mental Status: He is alert.  ?   Comments: MENTAL STATUS EXAM:    ?Orientation: Alert and oriented to person, place and time.  ?Memory: Cooperative, follows commands well.  ?Language: Speech is clear and language is normal.  ? ?CRANIAL NERVES:    ?CN 2 (Optic): Visual fields intact to confrontation.  ?CN 3,4,6 (EOM): Pupils equal and reactive to light. Full extraocular eye movement without nystagmus.  ?CN 5 (Trigeminal): Facial sensation is normal, no weakness of masticatory muscles.  ?CN 7 (Facial): No facial weakness or asymmetry.  ?CN 8 (Auditory): Auditory acuity grossly normal.  ?CN 9,10 (Glossophar): The uvula is midline, the palate elevates symmetrically.  ?CN 11 (spinal access): Normal sternocleidomastoid and trapezius strength.  ?CN 12 (Hypoglossal): The tongue is midline. No atrophy or fasciculations..  ? ?MOTOR:  Muscle Strength: 5/5RUE, 5/5LUE, 5/5RLE, 5/5LLE.  ? ?COORDINATION:   No tremor.  ? ?SENSATION:   Intact to light touch all  four extremities. ? ?GAIT: Gait normal without ataxia ?  ?Psychiatric:     ?   Mood and Affect: Mood normal.  ? ? ?ED Results / Procedures / Treatments   ?Labs ?(all labs ordered are listed, but only abnormal results are displayed) ?Labs Reviewed  ?CBC WITH DIFFERENTIAL/PLATELET - Abnormal; Notable for the following components:  ?    Result Value  ? RBC 3.42 (*)   ? Hemoglobin 10.6 (*)   ? HCT 29.7 (*)   ? Abs Immature Granulocytes 0.08 (*)   ? All other components within normal limits  ?BASIC METABOLIC PANEL - Abnormal; Notable for the following components:  ? Sodium 126 (*)   ? Potassium 2.8 (*)   ? Chloride 86 (*)   ? All other  components within normal limits  ?MAGNESIUM - Abnormal; Notable for the following components:  ? Magnesium 1.6 (*)   ? All other components within normal limits  ?HEPATIC FUNCTION PANEL  ?NA AND K (SODIUM & POTASSIUM), RAND UR  ?TSH  ?OSMOLALITY, URINE  ? ? ?EKG ?EKG Interpretation ? ?Date/Time:  Monday April 21 2021 10:07:09 EDT ?Ventricular Rate:  67 ?PR Interval:  158 ?QRS Duration: 114 ?QT Interval:  435 ?QTC Calculation: 460 ?R Axis:   21 ?Text Interpretation: Sinus rhythm Confirmed by Regan Lemming ([id number]) on 04/21/2021 10:18:18 AM ? ?Radiology ?No results found. ? ?Procedures ?Procedures  ? ? ?Medications Ordered in ED ?Medications  ?magnesium sulfate IVPB 2 g 50 mL (has no administration in time range)  ?potassium chloride 10 mEq in 100 mL IVPB (has no administration in time range)  ?potassium chloride SA (KLOR-CON M) CR tablet 40 mEq (has no administration in time range)  ?sodium chloride 0.9 % bolus 1,000 mL (has no administration in time range)  ?ondansetron (ZOFRAN) injection 4 mg (has no administration in time range)  ?oxyCODONE-acetaminophen (PERCOCET/ROXICET) 5-325 MG per tablet 2 tablet (has no administration in time range)  ? ? ?ED Course/ Medical Decision Making/ A&P ?  ?                        ?Medical Decision Making ?Amount and/or Complexity of Data Reviewed ?Labs:  ordered. ? ?Risk ?Prescription drug management. ?Decision regarding hospitalization. ? ? ?56 year old male with medical history significant for hypertension on hydrochlorothiazide, recent right hip replacement 7 days po

## 2021-04-21 NOTE — Plan of Care (Signed)
New admission 1509, Hyponatremia.  ?Trending labs, replace electrolytes. ?Regular diet, fluid restriction.  ?Strict I/O's.  ? ?Problem: Education: ?Goal: Knowledge of General Education information will improve ?Description: Including pain rating scale, medication(s)/side effects and non-pharmacologic comfort measures ?Outcome: Progressing ?  ?Problem: Health Behavior/Discharge Planning: ?Goal: Ability to manage health-related needs will improve ?Outcome: Progressing ?  ?Problem: Clinical Measurements: ?Goal: Ability to maintain clinical measurements within normal limits will improve ?Outcome: Progressing ?Goal: Will remain free from infection ?Outcome: Progressing ?Goal: Diagnostic test results will improve ?Outcome: Progressing ?Goal: Respiratory complications will improve ?Outcome: Progressing ?Goal: Cardiovascular complication will be avoided ?Outcome: Progressing ?  ?Problem: Activity: ?Goal: Risk for activity intolerance will decrease ?Outcome: Progressing ?  ?Problem: Nutrition: ?Goal: Adequate nutrition will be maintained ?Outcome: Progressing ?  ?Problem: Coping: ?Goal: Level of anxiety will decrease ?Outcome: Progressing ?  ?Problem: Elimination: ?Goal: Will not experience complications related to bowel motility ?Outcome: Progressing ?Goal: Will not experience complications related to urinary retention ?Outcome: Progressing ?  ?Problem: Pain Managment: ?Goal: General experience of comfort will improve ?Outcome: Progressing ?  ?Problem: Safety: ?Goal: Ability to remain free from injury will improve ?Outcome: Progressing ?  ?Problem: Skin Integrity: ?Goal: Risk for impaired skin integrity will decrease ?Outcome: Progressing ?  ?

## 2021-04-21 NOTE — H&P (Signed)
?History and Physical  ? ? ?Patient: Reginald Walsh ERD:[id number] DOB: [birth year] ?DOA: 04/21/2021 ?DOS: the patient was seen and examined on 04/21/2021 ?PCP: [facility]  ?Patient coming from: Home ? ?Chief Complaint:  ?Chief Complaint  ?Patient presents with  ? Abnormal Lab  ? ?HPI: Reginald Walsh is a 56 y.o. male with medical history significant of ascending aorta enlargement, chest pain with normal coronary arteries, hypertension, class I obesity, chronic back pain, cervical spondylosis and radiculopathy who had a right hip arthroplasty a week ago and is being sent by his provider to the emergency department due to abnormal sodium level.  The patient stated that before his procedure his sodium was low, he increase his sodium intake and decreased free water consumption while he was still taking the HCTZ.  They were able to do the procedure after his sodium increased.  He went today for regular follow-up, got lab work and was subsequently referred to the emergency department after his sodium came back abnormally low.  His pain has been controlled on his surgical site.  He denied fever, chills, sore throat, rhinorrhea, dyspnea, wheezing or hemoptysis.  No chest pain, palpitations, diaphoresis, PND, orthopnea or pitting edema of the lower extremities.  No dysuria, frequency hematuria.  No polyuria, polydipsia, polyphagia or blurred vision. ? ?ED course: Initial vital signs were temperature 97.9 ?F, pulse 65, respiration 18, BP 139/92 mmHg O2 sat 96% on room air.  The patient received 1000 mL of normal saline bolus in the emergency department. ? ?Lab work: CBC showed white count 5.6, Mono 10.6 g/dL platelets 341.  LFTs were normal.  BMP with a sodium 126, potassium 2.8 and chloride 86 mmol/L.  CO2, calcium and renal function were normal.  Magnesium was 1.6 mg/dL.  TSH was normal.  Urine and potassium sodium were 12 and 23 mmol/L respectively.  Urine osmolality was 157. ? ?Review of Systems: As mentioned in  the history of present illness. All other systems reviewed and are negative. ?Past Medical History:  ?Diagnosis Date  ? 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[place]) 03/2019  ? 40 mm by echo  ? Chest pain of unknown etiology 02/2019  ? normal coronaries after abnormal Myoview  ? Hypertension   ? normal RA dopplers  ? ?Past Surgical History:  ?Procedure Laterality Date  ? LEFT HEART CATH AND CORONARY ANGIOGRAPHY N/A 03/21/2019  ? Procedure: LEFT HEART CATH AND CORONARY ANGIOGRAPHY;  Surgeon: Burnell Blanks, MD;  Location: [facility];  Service: Cardiovascular;  Laterality: N/A;  ? LUMBAR LAMINECTOMY    ? REVISION TOTAL HIP ARTHROPLASTY    ? TENDON RELEASE    ? May 2018  ? ?Social History:  reports that he has quit smoking. His smokeless tobacco use includes snuff. He reports current alcohol use. He reports that he does not use drugs. ? ?No Known Allergies ? ?Family History  ?Adopted: Yes  ?Problem Relation Age of Onset  ? Lung cancer Father   ? Prostate cancer Father   ?     Unsure of age of onset  ? ? ?Prior to Admission medications   ?Medication Sig Start Date End Date Taking? Authorizing Provider  ?albuterol (VENTOLIN HFA) 108 (90 Base) MCG/ACT inhaler Inhale 2 puffs into the lungs every 6 (six) hours as needed for wheezing or shortness of breath.    [provider]  ?augmented betamethasone dipropionate (DIPROLENE-AF) 0.05 % cream Apply 1 application topically 2 (two) times daily as needed (eczema).    [provider]  ?carvedilol (COREG) 25 MG tablet Take 1 tablet (25 mg total) by mouth 2 (two) times daily. 12/09/20 12/04/21  Loel Dubonnet, NP  ?Cholecalciferol (VITAMIN D3) 1.25 MG (50000 UT) CAPS Take by mouth. PT ISN'T SURE OF THE DOSAGE    [provider]  ?diclofenac Sodium (VOLTAREN) 1 % GEL Apply 1 application topically daily as needed (pain).    [provider]  ?fluticasone (FLONASE) 50 MCG/ACT nasal spray Place 1 spray into both nostrils daily as needed for  allergies.     [provider]  ?hydrochlorothiazide (HYDRODIURIL) 50 MG tablet TAKE 1 TABLET BY MOUTH EVERY DAY 02/05/21   Loel Dubonnet, NP  ?HYDROcodone-acetaminophen (NORCO) 7.5-325 MG tablet Take 1 tablet by mouth every 6 (six) hours as needed for moderate pain.    [provider]  ?ipratropium-albuterol (DUONEB) 0.5-2.5 (3) MG/3ML SOLN Take 3 mLs by nebulization every 4 (four) hours as needed (shortness of breath).    [provider]  ?Multiple Vitamin (MULTIVITAMIN) tablet Take 1 tablet by mouth daily. PT ISN'T SURE OF THE DOSAGE    [provider]  ?Multiple Vitamins-Minerals (MENS MULTIVITAMIN PLUS PO) Take 1 tablet by mouth daily.     [provider]  ?nitroGLYCERIN (NITROSTAT) 0.4 MG SL tablet Place 1 tablet (0.4 mg total) under the tongue every 5 (five) minutes as needed for chest pain. ?Patient not taking: Reported on 12/06/2019 03/16/19 06/14/19  Erlene Quan, PA-C  ?Omega-3 Fatty Acids (OMEGA 3 PO) Take 1 capsule by mouth daily.    [provider]  ?pantoprazole (PROTONIX) 40 MG tablet Take 40 mg by mouth daily.    [provider]  ?pregabalin (LYRICA) 150 MG capsule Take 150 mg by mouth 2 (two) times daily.     [provider]  ?rosuvastatin (CRESTOR) 20 MG tablet Take 1 tablet (20 mg total) by mouth daily. 01/28/21   Loel Dubonnet, NP  ?sertraline (ZOLOFT) 50 MG tablet Take 100 mg by mouth daily.   02/14/20  [provider]  ?topiramate (TOPAMAX) 25 MG tablet Take 1 tablet (25 mg total) by mouth 2 (two) times daily. Start one at night for 4 days and then start taking one twice a day 01/18/20 02/14/20  [name], MD  ? ? ?Physical Exam: ?Vitals:  ? 04/21/21 1115 04/21/21 1130 04/21/21 1200 04/21/21 1256  ?BP: 111/73 115/79 115/80 117/81  ?Pulse: 66 65 66 65  ?Resp: '13 16 13 16  '$ ?Temp:    (!) 97.5 ?F (36.4 ?C)  ?TempSrc:    Oral  ?SpO2: 98% 99% 95% 96%  ?Weight:      ?Height:      ? ?Physical Exam ?Constitutional:    ?   Appearance: Normal appearance. He is obese.  ?HENT:  ?   Head: Normocephalic and atraumatic.  ?   Mouth/Throat:  ?   Mouth: Mucous membranes are moist.  ?Eyes:  ?   General: No scleral icterus. ?   Pupils: Pupils are equal, round, and reactive to light.  ?Neck:  ?   Vascular: No JVD.  ?Cardiovascular:  ?   Rate and Rhythm: Normal rate and regular rhythm.  ?   Heart sounds: S1 normal and S2 normal.  ?Pulmonary:  ?   Effort: Pulmonary effort is normal.  ?   Breath sounds: Normal breath sounds.  ?Abdominal:  ?   General: There is no distension.  ?   Palpations: Abdomen is soft.  ?   Tenderness: There  is no abdominal tenderness. There is no guarding or rebound.  ?Musculoskeletal:  ?   Cervical back: Neck supple.  ?   Right lower leg: No edema.  ?   Left lower leg: No edema.  ?Skin: ?   General: Skin is warm and dry.  ?Neurological:  ?   General: No focal deficit present.  ?   Mental Status: He is alert and oriented to person, place, and time.  ?Psychiatric:     ?   Mood and Affect: Mood normal.     ?   Behavior: Behavior normal.  ? ? ?Data Reviewed: ? ?There are no new results to review at this time. ? ?Assessment and Plan: ?Principal Problem: ?  Hyponatremia ?Observation/telemetry. ?Fluid restriction. ?Liberalize sodium intake. ?Hold hydrochlorothiazide. ?Continue normal saline infusion. ?Follow-up sodium level in AM. ? ?Active Problems: ?  Hypomagnesemia ?Replacement given. ? ?  Hypokalemia ?Replacing. ?Follow-up potassium level. ? ?  Essential hypertension ?Hold hydrochlorothiazide. ?Continue carvedilol 25 mg p.o. twice daily. ? ?  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[place]) ?Continue surveillance as scheduled. ? ?  Chronic back pain ?Analgesics as needed. ?Muscle relaxants as needed. ? ?  Hyperlipidemia ?Continue rosuvastatin. ? ?  COPD ?Continue Combivent MDI. ? ?  Chronic PTSD ?Continue duloxetine. ?Follow-up with behavioral health as scheduled. ? ? ? Advance Care Planning:   Code Status: Full Code  ? ?Consults:   ? ?Family Communication: His wife was present in the ED room. ? ?Severity of Illness: ?The appropriate patient status for this patient is OBSERVATION. Observation status is judged to be reasonable and necess

## 2021-04-22 DIAGNOSIS — G8929 Other chronic pain: Secondary | ICD-10-CM | POA: Diagnosis present

## 2021-04-22 DIAGNOSIS — E876 Hypokalemia: Secondary | ICD-10-CM | POA: Diagnosis present

## 2021-04-22 DIAGNOSIS — F4312 Post-traumatic stress disorder, chronic: Secondary | ICD-10-CM | POA: Diagnosis present

## 2021-04-22 DIAGNOSIS — Z96641 Presence of right artificial hip joint: Secondary | ICD-10-CM | POA: Diagnosis present

## 2021-04-22 DIAGNOSIS — E871 Hypo-osmolality and hyponatremia: Secondary | ICD-10-CM | POA: Diagnosis present

## 2021-04-22 DIAGNOSIS — Z720 Tobacco use: Secondary | ICD-10-CM | POA: Diagnosis not present

## 2021-04-22 DIAGNOSIS — Z79899 Other long term (current) drug therapy: Secondary | ICD-10-CM | POA: Diagnosis not present

## 2021-04-22 DIAGNOSIS — E785 Hyperlipidemia, unspecified: Secondary | ICD-10-CM | POA: Diagnosis present

## 2021-04-22 DIAGNOSIS — D519 Vitamin B12 deficiency anemia, unspecified: Secondary | ICD-10-CM | POA: Diagnosis present

## 2021-04-22 DIAGNOSIS — I10 Essential (primary) hypertension: Secondary | ICD-10-CM | POA: Diagnosis present

## 2021-04-22 DIAGNOSIS — E669 Obesity, unspecified: Secondary | ICD-10-CM | POA: Diagnosis present

## 2021-04-22 DIAGNOSIS — M549 Dorsalgia, unspecified: Secondary | ICD-10-CM | POA: Diagnosis present

## 2021-04-22 DIAGNOSIS — T502X5A Adverse effect of carbonic-anhydrase inhibitors, benzothiadiazides and other diuretics, initial encounter: Secondary | ICD-10-CM | POA: Diagnosis present

## 2021-04-22 DIAGNOSIS — J449 Chronic obstructive pulmonary disease, unspecified: Secondary | ICD-10-CM | POA: Diagnosis present

## 2021-04-22 DIAGNOSIS — D638 Anemia in other chronic diseases classified elsewhere: Secondary | ICD-10-CM | POA: Diagnosis present

## 2021-04-22 DIAGNOSIS — Z6830 Body mass index (BMI) 30.0-30.9, adult: Secondary | ICD-10-CM | POA: Diagnosis not present

## 2021-04-22 LAB — HIV ANTIBODY (ROUTINE TESTING W REFLEX): HIV Screen 4th Generation wRfx: NONREACTIVE

## 2021-04-22 LAB — IRON AND TIBC
Iron: 54 ug/dL (ref 45–182)
Saturation Ratios: 17 % — ABNORMAL LOW (ref 17.9–39.5)
TIBC: 311 ug/dL (ref 250–450)
UIBC: 257 ug/dL

## 2021-04-22 LAB — NA AND K (SODIUM & POTASSIUM), RAND UR
Potassium Urine: 7 mmol/L
Sodium, Ur: 22 mmol/L

## 2021-04-22 LAB — RETICULOCYTES
Immature Retic Fract: 26.2 % — ABNORMAL HIGH (ref 2.3–15.9)
RBC.: 3.11 MIL/uL — ABNORMAL LOW (ref 4.22–5.81)
Retic Count, Absolute: 102.3 10*3/uL (ref 19.0–186.0)
Retic Ct Pct: 3.3 % — ABNORMAL HIGH (ref 0.4–3.1)

## 2021-04-22 LAB — COMPREHENSIVE METABOLIC PANEL
ALT: 16 U/L (ref 0–44)
AST: 19 U/L (ref 15–41)
Albumin: 3.2 g/dL — ABNORMAL LOW (ref 3.5–5.0)
Alkaline Phosphatase: 67 U/L (ref 38–126)
Anion gap: 6 (ref 5–15)
BUN: 9 mg/dL (ref 6–20)
CO2: 28 mmol/L (ref 22–32)
Calcium: 8.6 mg/dL — ABNORMAL LOW (ref 8.9–10.3)
Chloride: 97 mmol/L — ABNORMAL LOW (ref 98–111)
Creatinine, Ser: 0.84 mg/dL (ref 0.61–1.24)
GFR, Estimated: >60 mL/min (ref >60)
Glucose, Bld: 105 mg/dL — ABNORMAL HIGH (ref 70–99)
Potassium: 3.6 mmol/L (ref 3.5–5.1)
Sodium: 131 mmol/L — ABNORMAL LOW (ref 135–145)
Total Bilirubin: 0.5 mg/dL (ref 0.3–1.2)
Total Protein: 6.1 g/dL — ABNORMAL LOW (ref 6.5–8.1)

## 2021-04-22 LAB — FOLATE: Folate: 9.4 ng/mL (ref >5.9)

## 2021-04-22 LAB — CBC
HCT: 28 % — ABNORMAL LOW (ref 39.0–52.0)
Hemoglobin: 9.5 g/dL — ABNORMAL LOW (ref 13.0–17.0)
MCH: 30.7 pg (ref 26.0–34.0)
MCHC: 33.9 g/dL (ref 30.0–36.0)
MCV: 90.6 fL (ref 80.0–100.0)
Platelets: 237 10*3/uL (ref 150–400)
RBC: 3.09 MIL/uL — ABNORMAL LOW (ref 4.22–5.81)
RDW: 13.1 % (ref 11.5–15.5)
WBC: 4.2 10*3/uL (ref 4.0–10.5)
nRBC: 0 % (ref 0.0–0.2)

## 2021-04-22 LAB — FERRITIN: Ferritin: 254 ng/mL (ref 24–336)

## 2021-04-22 LAB — MAGNESIUM: Magnesium: 1.7 mg/dL (ref 1.7–2.4)

## 2021-04-22 LAB — OSMOLALITY, URINE: Osmolality, Ur: 158 mOsm/kg — ABNORMAL LOW (ref 300–900)

## 2021-04-22 LAB — VITAMIN B12: Vitamin B-12: 233 pg/mL (ref 180–914)

## 2021-04-22 MED ORDER — MAGNESIUM OXIDE -MG SUPPLEMENT 400 (240 MG) MG PO TABS
400.0000 mg | ORAL_TABLET | Freq: Two times a day (BID) | ORAL | Status: DC
  Administered 2021-04-22 – 2021-04-23 (×3): 400 mg via ORAL
  Filled 2021-04-22 (×3): qty 1

## 2021-04-22 MED ORDER — MORPHINE SULFATE (PF) 2 MG/ML IV SOLN
1.0000 mg | INTRAVENOUS | Status: AC | PRN
  Administered 2021-04-22: 1 mg via INTRAVENOUS
  Filled 2021-04-22: qty 1

## 2021-04-22 MED ORDER — POTASSIUM CHLORIDE CRYS ER 20 MEQ PO TBCR
20.0000 meq | EXTENDED_RELEASE_TABLET | Freq: Every day | ORAL | Status: DC
  Administered 2021-04-22 – 2021-04-23 (×2): 20 meq via ORAL
  Filled 2021-04-22 (×2): qty 1

## 2021-04-22 MED ORDER — OXYCODONE HCL 5 MG PO TABS
10.0000 mg | ORAL_TABLET | ORAL | Status: DC | PRN
  Administered 2021-04-22 – 2021-04-23 (×6): 10 mg via ORAL
  Filled 2021-04-22 (×6): qty 2

## 2021-04-22 MED ORDER — DOCUSATE SODIUM 100 MG PO CAPS
100.0000 mg | ORAL_CAPSULE | Freq: Every day | ORAL | Status: DC
  Administered 2021-04-22 – 2021-04-23 (×2): 100 mg via ORAL
  Filled 2021-04-22 (×2): qty 1

## 2021-04-22 NOTE — Progress Notes (Signed)
?PROGRESS NOTE ?Reginald Walsh  VEL:[id number] DOB: 20-Oct-1965 DOA: 04/21/2021 ?PCP: [facility]  ? ?Brief Narrative/Hospital Course: ?56 y.o. male  w/ ascending aorta enlargement, chest pain with normal coronary arteries, hypertension, class I obesity, chronic back pain, cervical spondylosis and radiculopathy recent right hip arthroplasty a week ago sent by his MD due to abnormal sodium level.The patient stated that before his procedure his sodium was low, he increased his sodium intake and decreased free water consumption while he was still taking the HCTZ,were able to do the procedure after his sodium increased. On regular follow-up, got lab work and was subsequently referred to the ED. His pain has been controlled on his surgical site.  ?In CH:ENIDP signs stable, Lab work: CBC showed white count 5.6, Mono 10.6 g/dL platelets 341.  LFTs were normal.  BMP with a sodium 126, potassium 2.8 and chloride 86 mmol/L.  CO2, calcium and renal function were normal.  Magnesium was 1.6 mg/dL.  TSH was normal.  Patient was given potassium supplement, magnesium head CT was held and admitted for further management of electrolyte imbalance ?  ?  ?Subjective: ?Seen and examined this morning anxious about his electrolyte and also about his anemia having some blackish stool.  Reports he has been taking aspirin for DVT prophylaxis. ?Denies heartburn. ? ?Assessment and Plan: ?Principal Problem: ?  Hyponatremia ?Active Problems: ?  Essential hypertension ?  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[facility]) ?  Chronic back pain ?  Hypomagnesemia ?  Hypokalemia ?  Chronic post-traumatic stress disorder ?  Chronic obstructive pulmonary disease, unspecified ([place]) ?  Hyperlipidemia ? ?Hyponatremia: Suspect multifactorial recently sodium has been on the lower side but input preop.  Mostly from HCTZ use.  It has been discontinued.  Sodium improving on IV fluids, encourage p.o. ?Recent Labs  ?Lab 04/21/21 ?1014 04/22/21 ?8242  ?NA 126* 131*  ?   ?Electrolyte imbalance hypokalemia hypomagnesemia: Being repleted orally.  Monitor ? ?Hypertension: BP appears controlled.  HCTZ discontinued, continue on Coreg ?Ascending aortic enlargementL continue surveillance as outpatient ?Chronic back pain -cont  prn meds ?HLD on statin ?COPD stable on Combivent/inhalers ?Chronic PTSD on Cymbalta ? ?Anemia, suspect from chronic disease, recent hip surgery possible blood loss.  Does have blackish stool and has been on aspirin for DVT prophylaxis since his hip surgery.  Check Hemoccult, add PPI, check anemia panel ? ?Recent right hip repair: Aquacel dressing intact has appointment to see his orthopedics 2 weeks postop.  Complaints of pain has improved.  Add IV morphine and oral opiates for pain control.  Mobilize. ? ?Class I Obesity:Patient's Body mass index:31.28 kg/m?. : Will benefit with PCP follow-up, weight loss  healthy lifestyle and outpatient sleep evaluation. ? ?DVT prophylaxis: SCDs Start: 04/21/21 1305 ?Code Status:   Code Status: Full Code ?Family Communication: plan of care discussed with patient/WIFE at bedside. ?Patient status is: Observation continues to need inpatient stay for ongoing IV rehydration, anemia work-up ? level of care: Telemetry  ?Remains inpatient because: ?Patient currently NOT stable ? ?Dispo: The patient is from: HOME ?           Anticipated disposition: HOME ? ?Mobility Assessment (last 72 hours)   ? ? Mobility Assessment   ? ? [place] Name 04/21/21 2028 04/21/21 1300  ?  ?  ?  ? Does patient have an order for bedrest or is patient medically unstable No - Continue assessment No - Continue assessment     ? What is the highest level of mobility based on the  progressive mobility assessment? Level 5 (Walks with assist in room/hall) - Balance while stepping forward/back and can walk in room with assist - Complete Level 5 (Walks with assist in room/hall) - Balance while stepping forward/back and can walk in room with assist - Complete     ? ?  ?  ? ?   ?  ? ?Objective: ?Vitals last 24 hrs: ?Vitals:  ? Apr 22, 2021 2058 04/22/21 0057 04/22/21 0336 04/22/21 0904  ?BP: 115/73 101/70 111/71   ?Pulse: 80 80 79   ?Resp: '16 16 18   '$ ?Temp: 99.4 ?F (37.4 ?C) (!) 97.4 ?F (36.3 ?C) 97.9 ?F (36.6 ?C)   ?TempSrc: Oral Oral Oral   ?SpO2: 98% 92% 92% 95%  ?Weight:      ?Height:      ? ?Weight change:  ? ?Physical Examination: ?General exam: AA0x3,older than stated age, weak appearing. ?HEENT:Oral mucosa moist, Ear/Nose WNL grossly, dentition normal. ?Respiratory system: bilaterally diminished BS, no use of accessory muscle ?Cardiovascular system: S1 & S2 +, No JVD,. ?Gastrointestinal system: Abdomen soft,NT,ND, BS+ ?Nervous System:Alert, awake, moving extremities and grossly nonfocal ?Extremities: LE edema NONE, right hip surgical site with Aquacel dressing in PLACE, distal peripheral pulses palpable.  ?Skin: No rashes,no icterus. ?MSK: Normal muscle bulk,tone, power ? ?Medications reviewed:  ?Scheduled Meds: ? aspirin EC  81 mg Oral Daily  ? carvedilol  25 mg Oral BID AC  ? docusate sodium  100 mg Oral Daily  ? DULoxetine  60 mg Oral Daily  ? fluticasone  2 spray Each Nare Daily  ? ipratropium-albuterol  3 mL Inhalation Daily  ? magnesium oxide  400 mg Oral BID  ? pantoprazole  40 mg Oral Daily  ? potassium chloride  20 mEq Oral Daily  ? pregabalin  150 mg Oral BID  ? rosuvastatin  20 mg Oral Daily  ? ?Continuous Infusions: ? 0.9 % NaCl with KCl 20 mEq / L 125 mL/hr at 04/22/21 0848  ? ? ?  ?Diet Order   ? ?       ?  Diet regular Room service appropriate? Yes; Fluid consistency: Thin; Fluid restriction: 1200 mL Fluid  Diet effective now       ?  ? ?  ?  ? ?  ?  ? ?  ?  ?  ? ? ?Intake/Output Summary (Last 24 hours) at 04/22/2021 1318 ?Last data filed at 04/22/2021 1300 ?Gross per 24 hour  ?Intake 3000.86 ml  ?Output 3375 ml  ?Net -374.14 ml  ? ?Net IO Since Admission: -334.14 mL [04/22/21 1318]  ?Wt Readings from Last 3 Encounters:  ?04-22-2021 113.5 kg  ?07/24/20 112.9 kg  ?06/21/20  115.3 kg  ?  ? ?Unresulted Labs (From admission, onward)  ? ?  Start     Ordered  ? 04/22/21 0927  Occult blood card to lab, stool  ONCE - STAT,   STAT       ? 04/22/21 0926  ? ?  ?  ? ?  ?Data Reviewed: I have personally reviewed following labs and imaging studies ?CBC: ?Recent Labs  ?Lab 2021-04-22 ?1014 04/22/21 ?5277  ?WBC 5.6 4.2  ?NEUTROABS 3.2  --   ?HGB 10.6* 9.5*  ?HCT 29.7* 28.0*  ?MCV 86.8 90.6  ?PLT 341 237  ? ?Basic Metabolic Panel: ?Recent Labs  ?Lab 2021-04-22 ?1014 04/22/21 ?8242  ?NA 126* 131*  ?K 2.8* 3.6  ?CL 86* 97*  ?CO2 29 28  ?GLUCOSE 99 105*  ?BUN 9 9  ?CREATININE 0.80  0.84  ?CALCIUM 9.2 8.6*  ?MG 1.6* 1.7  ? ?GFR: ?Estimated Creatinine Clearance: 133.5 mL/min (by C-G formula based on SCr of 0.84 mg/dL). ?Liver Function Tests: ?Recent Labs  ?Lab 04/21/21 ?1014 04/22/21 ?6283  ?AST 25 19  ?ALT 18 16  ?ALKPHOS 75 67  ?BILITOT 0.9 0.5  ?PROT 6.7 6.1*  ?ALBUMIN 3.5 3.2*  ? ?No results for input(s): LIPASE, AMYLASE in the last 168 hours. ?No results for input(s): AMMONIA in the last 168 hours. ?Coagulation Profile: ?No results for input(s): INR, PROTIME in the last 168 hours. ?BNP (last 3 results) ?No results for input(s): PROBNP in the last 8760 hours. ?HbA1C: ?No results for input(s): HGBA1C in the last 72 hours. ?CBG: ?No results for input(s): GLUCAP in the last 168 hours. ?Lipid Profile: ?No results for input(s): CHOL, HDL, LDLCALC, TRIG, CHOLHDL, LDLDIRECT in the last 72 hours. ?Thyroid Function Tests: ?Recent Labs  ?  04/21/21 ?1014  ?TSH 1.071  ? ?Sepsis Labs: ?No results for input(s): PROCALCITON, LATICACIDVEN in the last 168 hours. ? ?No results found for this or any previous visit (from the past 240 hour(s)).  ?Antimicrobials: ?Anti-infectives (From admission, onward)  ? ? None  ? ?  ? ?Culture/Microbiology ?No results found for: SDES, [place], [place], REPTSTATUS  ?Other culture-see note  ?Radiology Studies: ?No results found. ? ? LOS: 0 days  ? ?Antonieta Pert, MD ?[facility] ? ?04/22/2021, 1:18 PM  ?  ?

## 2021-04-22 NOTE — Hospital Course (Addendum)
56 y.o. male  w/ ascending aorta enlargement, chest pain with normal coronary arteries, hypertension, class I obesity, chronic back pain, cervical spondylosis and radiculopathy recent right hip arthroplasty a week ago sent by his MD due to abnormal sodium level.The patient stated that before his procedure his sodium was low, he increased his sodium intake and decreased free water consumption while he was still taking the HCTZ,were able to do the procedure after his sodium increased. On regular follow-up, got lab work and was subsequently referred to the ED. His pain has been controlled on his surgical site.  ?In VO:JJKKX signs stable, Lab work: CBC showed white count 5.6, Mono 10.6 g/dL platelets 341.  LFTs were normal.  BMP with a sodium 126, potassium 2.8 and chloride 86 mmol/L.  CO2, calcium and renal function were normal.  Magnesium was 1.6 mg/dL.  TSH was normal.  Patient was given potassium supplement, magnesium, HCTZ was held and admitted for further management of electrolyte imbalance. ?Noted to have mild anemia, anemia panel was done that showed folate 9.4 B12 233 iron 54 ferritin 254, will repeat B12 supplement.  At this time sodium potassium magnesium have stabilized and he will benefit with oral supplementation.  He will need to follow-up with his PCP for CBC check in a week.  Hemoccult NOT DONE AS NO bm. ? ?

## 2021-04-23 LAB — BASIC METABOLIC PANEL
Anion gap: 4 — ABNORMAL LOW (ref 5–15)
BUN: 9 mg/dL (ref 6–20)
CO2: 29 mmol/L (ref 22–32)
Calcium: 9.1 mg/dL (ref 8.9–10.3)
Chloride: 99 mmol/L (ref 98–111)
Creatinine, Ser: 0.85 mg/dL (ref 0.61–1.24)
GFR, Estimated: >60 mL/min (ref >60)
Glucose, Bld: 98 mg/dL (ref 70–99)
Potassium: 4 mmol/L (ref 3.5–5.1)
Sodium: 132 mmol/L — ABNORMAL LOW (ref 135–145)

## 2021-04-23 LAB — CBC
HCT: 27.7 % — ABNORMAL LOW (ref 39.0–52.0)
Hemoglobin: 9.3 g/dL — ABNORMAL LOW (ref 13.0–17.0)
MCH: 30.8 pg (ref 26.0–34.0)
MCHC: 33.6 g/dL (ref 30.0–36.0)
MCV: 91.7 fL (ref 80.0–100.0)
Platelets: 244 10*3/uL (ref 150–400)
RBC: 3.02 MIL/uL — ABNORMAL LOW (ref 4.22–5.81)
RDW: 13.4 % (ref 11.5–15.5)
WBC: 3.7 10*3/uL — ABNORMAL LOW (ref 4.0–10.5)
nRBC: 0 % (ref 0.0–0.2)

## 2021-04-23 MED ORDER — MORPHINE SULFATE (PF) 2 MG/ML IV SOLN
1.0000 mg | Freq: Once | INTRAVENOUS | Status: AC
  Administered 2021-04-23: 1 mg via INTRAVENOUS
  Filled 2021-04-23: qty 1

## 2021-04-23 MED ORDER — MAGNESIUM OXIDE -MG SUPPLEMENT 400 (240 MG) MG PO TABS
400.0000 mg | ORAL_TABLET | Freq: Two times a day (BID) | ORAL | 0 refills | Status: AC
Start: 2021-04-23 — End: 2021-04-30

## 2021-04-23 MED ORDER — POTASSIUM CHLORIDE CRYS ER 10 MEQ PO TBCR: 10.0000 meq | EXTENDED_RELEASE_TABLET | Freq: Every day | ORAL | 0 refills | Status: DC

## 2021-04-23 MED ORDER — CARVEDILOL 25 MG PO TABS: 25.0000 mg | ORAL_TABLET | Freq: Two times a day (BID) | ORAL | 0 refills | Status: DC

## 2021-04-23 MED ORDER — VITAMIN B-12 100 MCG PO TABS: 100.0000 ug | ORAL_TABLET | Freq: Every day | ORAL | 0 refills | Status: AC

## 2021-04-23 NOTE — Plan of Care (Signed)
Pt for discharge this am home with spouse.  ?AVS printed and reviewed with patient at bedside.  ?Follow ups and new medications discussed.  ?All questions addressed, verbalized understanding.  ?Problem: Education: ?Goal: Knowledge of General Education information will improve ?Description: Including pain rating scale, medication(s)/side effects and non-pharmacologic comfort measures ?Outcome: Adequate for Discharge ?  ?Problem: Health Behavior/Discharge Planning: ?Goal: Ability to manage health-related needs will improve ?Outcome: Adequate for Discharge ?  ?Problem: Clinical Measurements: ?Goal: Ability to maintain clinical measurements within normal limits will improve ?Outcome: Adequate for Discharge ?Goal: Will remain free from infection ?Outcome: Adequate for Discharge ?Goal: Diagnostic test results will improve ?Outcome: Adequate for Discharge ?Goal: Respiratory complications will improve ?Outcome: Adequate for Discharge ?Goal: Cardiovascular complication will be avoided ?Outcome: Adequate for Discharge ?  ?Problem: Activity: ?Goal: Risk for activity intolerance will decrease ?Outcome: Adequate for Discharge ?  ?Problem: Nutrition: ?Goal: Adequate nutrition will be maintained ?Outcome: Adequate for Discharge ?  ?Problem: Coping: ?Goal: Level of anxiety will decrease ?Outcome: Adequate for Discharge ?  ?Problem: Elimination: ?Goal: Will not experience complications related to bowel motility ?Outcome: Adequate for Discharge ?Goal: Will not experience complications related to urinary retention ?Outcome: Adequate for Discharge ?  ?Problem: Pain Managment: ?Goal: General experience of comfort will improve ?Outcome: Adequate for Discharge ?  ?Problem: Safety: ?Goal: Ability to remain free from injury will improve ?Outcome: Adequate for Discharge ?  ?Problem: Skin Integrity: ?Goal: Risk for impaired skin integrity will decrease ?Outcome: Adequate for Discharge ?  ?

## 2021-04-23 NOTE — Discharge Summary (Signed)
?Physician Discharge Summary  ?Reginald Walsh URK:[id number] DOB: [birth year] DOA: 04/21/2021 ? ?PCP: [facility] ? ?Admit date: 04/21/2021 ?Discharge date: 04/23/2021 ?Recommendations for Outpatient Follow-up:  ?Follow up with PCP in 1 weeks-call for appointment ?Please obtain BMP/CBC in one week ?Check Hemoccult from your primary care doctor ? ?Discharge Dispo: home ?Discharge Condition: Stable ?Code Status:   Code Status: Full Code ?Diet recommendation:  ?Diet Order   ? ?       ?  Diet regular Room service appropriate? Yes; Fluid consistency: Thin; Fluid restriction: 1200 mL Fluid  Diet effective now       ?  ? ?  ?  ? ?  ?  ? ?Brief/Interim Summary: ?56 y.o. male  w/ ascending aorta enlargement, chest pain with normal coronary arteries, hypertension, class I obesity, chronic back pain, cervical spondylosis and radiculopathy recent right hip arthroplasty a week ago sent by his MD due to abnormal sodium level.The patient stated that before his procedure his sodium was low, he increased his sodium intake and decreased free water consumption while he was still taking the HCTZ,were able to do the procedure after his sodium increased. On regular follow-up, got lab work and was subsequently referred to the ED. His pain has been controlled on his surgical site.  ?In GB:TDVVO signs stable, Lab work: CBC showed white count 5.6, Mono 10.6 g/dL platelets 341.  LFTs were normal.  BMP with a sodium 126, potassium 2.8 and chloride 86 mmol/L.  CO2, calcium and renal function were normal.  Magnesium was 1.6 mg/dL.  TSH was normal.  Patient was given potassium supplement, magnesium, HCTZ was held and admitted for further management of electrolyte imbalance. ?Noted to have mild anemia, anemia panel was done that showed folate 9.4 B12 233 iron 54 ferritin 254, will repeat B12 supplement.  At this time sodium potassium magnesium have stabilized and he will benefit with oral supplementation.  He will need to follow-up with his  PCP for CBC check in a week.  Hemoccult NOT DONE AS NO bm. ?Patient feels ready for home today mobilizing better he will follow-up with his orthopedic surgeon.  He will follow-up with PCP to recheck his CBC this was discussed with patient and with nursing at the bedside ? ?Discharge Diagnoses:  ?Principal Problem: ?  Hyponatremia ?Active Problems: ?  Essential hypertension ?  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HCC) ?  Chronic back pain ?  Hypomagnesemia ?  Hypokalemia ?  Chronic post-traumatic stress disorder ?  Chronic obstructive pulmonary disease, unspecified ([place]) ?  Hyperlipidemia ? ?Hyponatremia: Suspect multifactorial recently sodium has been on the lower side but input preop.  Mostly from HCTZ use.  It has been discontinued.  Sodium improving on IV fluids, encourage p.o. ?Recent Labs  ?Lab 04/21/21 ?1014 04/22/21 ?1607 04/23/21 ?0433  ?NA 126* 131* 132*  ?  ?Electrolyte imbalance hypokalemia hypomagnesemia: Being repleted orally.  Monitor and op fu ?  ?Hypertension: BP appears controlled.  HCTZ discontinued, continue on Coreg ?Ascending aortic enlargementL continue surveillance as outpatient ?Chronic back pain -cont  prn meds ?HLD on statin ?COPD stable on Combivent/inhalers ?Chronic PTSD on Cymbalta ?  ?Anemia, suspect from chronic disease suspect acute blood loss as he does have bruise on his right hip from his recent hip surgery.No BM to check Hemoccult which is ordered here but at this time patient feels he feels ready for discharge and he will follow-up with his PCP-anemia panel obtained we will supplement with B12, he will follow-up with his PCP  for CBC check in a week and also obtain Hemoccult there as outpatient ?Recent Labs  ?Lab 04/21/21 ?1014 04/22/21 ?6468 04/23/21 ?0433  ?HGB 10.6* 9.5* 9.3*  ?HCT 29.7* 28.0* 27.7*  ?  ?  ?Recent right hip repair: Aquacel dressing intact has appointment to see his orthopedics 2 weeks postop.  Complaints of pain has improved.  Add IV morphine and oral opiates for pain  control.  Mobilize. ?  ?Class I Obesity:Patient's Body mass index:31.28 kg/m?. : Will benefit with PCP follow-up, weight loss  healthy lifestyle and outpatient sleep evaluation. ? ? ?Consults: ?none ?Subjective: ?Awake oriented.  He feels ready for discharge home today he needs to see his PCP for CBC within 5 days ? ?Discharge Exam: ?Vitals:  ? 04/23/21 0447 04/23/21 0745  ?BP: (!) 146/88   ?Pulse: 79   ?Resp: 16   ?Temp: 98.1 ?F (36.7 ?C)   ?SpO2: 99% 98%  ? ?General: Pt is alert, awake, not in acute distress ?Cardiovascular: RRR, S1/S2 +, no rubs, no gallops ?Respiratory: CTA bilaterally, no wheezing, no rhonchi ?Abdominal: Soft, NT, ND, bowel sounds + ?Extremities: no edema, no cyanosis ? ?Discharge Instructions ? ?Discharge Instructions   ? ? Discharge instructions   Complete by: As directed ?  ? Check his CBC in 5 to 7 days from his PCP.  Consider checking Hemoccult test with a primary care doctor and GI referral if positive ? ?See your ortho as scheduled or sooner ?Check hem occult from pcp ? ?Please call call MD or return to ER for similar or worsening recurring problem that brought you to hospital or if any fever,nausea/vomiting,abdominal pain, uncontrolled pain, chest pain,  shortness of breath or any other alarming symptoms. ? ?Please follow-up your doctor as instructed in a week time and call the office for appointment. ? ?Please avoid alcohol, smoking, or any other illicit substance and maintain healthy habits including taking your regular medications as prescribed. ? ?You were cared for by a hospitalist during your hospital stay. If you have any questions about your discharge medications or the care you received while you were in the hospital after you are discharged, you can call the unit and ask to speak with the hospitalist on call if the hospitalist that took care of you is not available. ? ?Once you are discharged, your primary care physician will handle any further medical issues. Please note that  NO REFILLS for any discharge medications will be authorized once you are discharged, as it is imperative that you return to your primary care physician (or establish a relationship with a primary care physician if you do not have one) for your aftercare needs so that they can reassess your need for medications and monitor your lab values  ? Discharge wound care:   Complete by: As directed ?  ? Leave the dressing in place and follow instruction from orthopedic surgery reinforce as needed  ? Increase activity slowly   Complete by: As directed ?  ? ?  ? ?Allergies as of 04/23/2021   ? ?   Reactions  ? Escitalopram Oxalate Anxiety  ? ?  ? ?  ?Medication List  ?  ? ?STOP taking these medications   ? ?hydrochlorothiazide 50 MG tablet ?Commonly known as: HYDRODIURIL ?  ? ?  ? ?TAKE these medications   ? ?albuterol 108 (90 Base) MCG/ACT inhaler ?Commonly known as: VENTOLIN HFA ?Inhale 2 puffs into the lungs every 6 (six) hours as needed for wheezing or shortness of breath. ?  ?  ARIPiprazole 2 MG tablet ?Commonly known as: ABILIFY ?Take 4 mg by mouth daily. ?  ?aspirin EC 81 MG tablet ?Take 81 mg by mouth daily. Swallow whole. ?  ?augmented betamethasone dipropionate 0.05 % cream ?Commonly known as: DIPROLENE-AF ?Apply 1 application topically 2 (two) times daily as needed (eczema). ?  ?Carboxymethylcellulose Sod PF 0.5 % Soln ?Apply 1 drop to eye 4 (four) times daily as needed (dry eyes). ?  ?carvedilol 25 MG tablet ?Commonly known as: COREG ?Take 1 tablet (25 mg total) by mouth 2 (two) times daily. ?  ?diclofenac Sodium 1 % Gel ?Commonly known as: VOLTAREN ?Apply 1 application topically daily as needed (pain). ?  ?DULoxetine 60 MG capsule ?Commonly known as: CYMBALTA ?Take 60 mg by mouth daily. ?  ?fluticasone 50 MCG/ACT nasal spray ?Commonly known as: FLONASE ?Place 2 sprays into both nostrils in the morning and at bedtime. ?  ?HYDROcodone-acetaminophen 10-325 MG tablet ?Commonly known as: NORCO ?Take 1 tablet by mouth every  8 (eight) hours as needed (chronic pain). ?  ?Ipratropium-Albuterol 20-100 MCG/ACT Aers respimat ?Commonly known as: COMBIVENT ?Inhale 1 puff into the lungs in the morning, at noon, in the evening,

## 2021-04-23 NOTE — Clinical Social Work Note (Signed)
?  Transition of Care (TOC) Screening Note ? ? ?Patient Details  ?Name: Reginald Walsh ?Date of Birth: [birth year] ? ? ?Transition of Care (TOC) CM/SW Contact:    ?Trish Mage, LCSW ?Phone Number: ?04/23/2021, 9:07 AM ? ? ? ?Transition of Care Department [facility]) has reviewed patient and no TOC needs have been identified at this time. We will continue to monitor patient advancement through interdisciplinary progression rounds. If new patient transition needs arise, please place a TOC consult. ? ? ?

## 2021-05-09 ENCOUNTER — Encounter (HOSPITAL_COMMUNITY): Payer: Self-pay

## 2021-05-09 ENCOUNTER — Emergency Department (HOSPITAL_COMMUNITY)
Admission: EM | Admit: 2021-05-09 | Discharge: 2021-05-09 | Disposition: A | Discharge status: Home / Self Care | Payer: No Typology Code available for payment source | Attending: Emergency Medicine | Admitting: Emergency Medicine

## 2021-05-09 ENCOUNTER — Other Ambulatory Visit: Payer: Self-pay

## 2021-05-09 DIAGNOSIS — I10 Essential (primary) hypertension: Secondary | ICD-10-CM | POA: Insufficient documentation

## 2021-05-09 DIAGNOSIS — L03115 Cellulitis of right lower limb: Secondary | ICD-10-CM | POA: Insufficient documentation

## 2021-05-09 DIAGNOSIS — J449 Chronic obstructive pulmonary disease, unspecified: Secondary | ICD-10-CM | POA: Diagnosis not present

## 2021-05-09 DIAGNOSIS — E871 Hypo-osmolality and hyponatremia: Secondary | ICD-10-CM | POA: Diagnosis not present

## 2021-05-09 DIAGNOSIS — M25551 Pain in right hip: Secondary | ICD-10-CM | POA: Diagnosis present

## 2021-05-09 HISTORY — DX: Chronic obstructive pulmonary disease, unspecified: J44.9

## 2021-05-09 LAB — URINALYSIS, ROUTINE W REFLEX MICROSCOPIC
Bilirubin Urine: NEGATIVE
Glucose, UA: NEGATIVE mg/dL
Hgb urine dipstick: NEGATIVE
Ketones, ur: 5 mg/dL — AB
Leukocytes,Ua: NEGATIVE
Nitrite: NEGATIVE
Protein, ur: 30 mg/dL — AB
Specific Gravity, Urine: 1.026 (ref 1.005–1.030)
pH: 5 (ref 5.0–8.0)

## 2021-05-09 LAB — COMPREHENSIVE METABOLIC PANEL
ALT: 15 U/L (ref 0–44)
AST: 15 U/L (ref 15–41)
Albumin: 3.7 g/dL (ref 3.5–5.0)
Alkaline Phosphatase: 93 U/L (ref 38–126)
Anion gap: 10 (ref 5–15)
BUN: 14 mg/dL (ref 6–20)
CO2: 22 mmol/L (ref 22–32)
Calcium: 9.1 mg/dL (ref 8.9–10.3)
Chloride: 97 mmol/L — ABNORMAL LOW (ref 98–111)
Creatinine, Ser: 1.04 mg/dL (ref 0.61–1.24)
GFR, Estimated: >60 mL/min (ref >60)
Glucose, Bld: 123 mg/dL — ABNORMAL HIGH (ref 70–99)
Potassium: 3.9 mmol/L (ref 3.5–5.1)
Sodium: 129 mmol/L — ABNORMAL LOW (ref 135–145)
Total Bilirubin: 0.8 mg/dL (ref 0.3–1.2)
Total Protein: 7.4 g/dL (ref 6.5–8.1)

## 2021-05-09 LAB — CBC WITH DIFFERENTIAL/PLATELET
Abs Immature Granulocytes: 0.05 10*3/uL (ref 0.00–0.07)
Basophils Absolute: 0 10*3/uL (ref 0.0–0.1)
Basophils Relative: 0 %
Eosinophils Absolute: 0.1 10*3/uL (ref 0.0–0.5)
Eosinophils Relative: 1 %
HCT: 30.3 % — ABNORMAL LOW (ref 39.0–52.0)
Hemoglobin: 10.5 g/dL — ABNORMAL LOW (ref 13.0–17.0)
Immature Granulocytes: 1 %
Lymphocytes Relative: 10 %
Lymphs Abs: 1 10*3/uL (ref 0.7–4.0)
MCH: 31.4 pg (ref 26.0–34.0)
MCHC: 34.7 g/dL (ref 30.0–36.0)
MCV: 90.7 fL (ref 80.0–100.0)
Monocytes Absolute: 0.8 10*3/uL (ref 0.1–1.0)
Monocytes Relative: 8 %
Neutro Abs: 7.7 10*3/uL (ref 1.7–7.7)
Neutrophils Relative %: 80 %
Platelets: 237 10*3/uL (ref 150–400)
RBC: 3.34 MIL/uL — ABNORMAL LOW (ref 4.22–5.81)
RDW: 14.6 % (ref 11.5–15.5)
WBC: 9.6 10*3/uL (ref 4.0–10.5)
nRBC: 0 % (ref 0.0–0.2)

## 2021-05-09 LAB — LACTIC ACID, PLASMA: Lactic Acid, Venous: 1 mmol/L (ref 0.5–1.9)

## 2021-05-09 MED ORDER — ONDANSETRON HCL 4 MG/2ML IJ SOLN
4.0000 mg | Freq: Once | INTRAMUSCULAR | Status: AC
  Administered 2021-05-09: 4 mg via INTRAVENOUS
  Filled 2021-05-09: qty 2

## 2021-05-09 MED ORDER — VANCOMYCIN HCL IN DEXTROSE 1-5 GM/200ML-% IV SOLN
1000.0000 mg | Freq: Once | INTRAVENOUS | Status: AC
  Administered 2021-05-09: 1000 mg via INTRAVENOUS
  Filled 2021-05-09: qty 200

## 2021-05-09 MED ORDER — DOXYCYCLINE HYCLATE 100 MG PO CAPS: 100.0000 mg | ORAL_CAPSULE | Freq: Two times a day (BID) | ORAL | 0 refills | Status: AC

## 2021-05-09 MED ORDER — DOXYCYCLINE HYCLATE 100 MG PO TABS
100.0000 mg | ORAL_TABLET | Freq: Once | ORAL | Status: AC
  Administered 2021-05-09: 100 mg via ORAL
  Filled 2021-05-09: qty 1

## 2021-05-09 MED ORDER — HYDROCODONE-ACETAMINOPHEN 10-325 MG PO TABS
1.0000 | ORAL_TABLET | Freq: Once | ORAL | Status: AC
  Administered 2021-05-09: 1 via ORAL
  Filled 2021-05-09: qty 1

## 2021-05-09 NOTE — ED Notes (Signed)
1 set of blood cultures collected and sent to lab. ?

## 2021-05-09 NOTE — ED Notes (Signed)
Discharge instructions reviewed, questions answered. Rx education provided. Pt states understanding and no further questions. Pt ambulatory with cane upon discharge. No s/s of distress noted. ? ?

## 2021-05-09 NOTE — Discharge Instructions (Signed)
Please see your orthopedic doctors in the office later this morning. Do not eat or drink anything prior to this appointment. I have sent antibiotics to your outpatient pharmacy as well.  ?

## 2021-05-09 NOTE — ED Provider Notes (Signed)
? ?Emergency Department Provider Note ? ? ?I have reviewed the triage vital signs and the nursing notes. ? ? ?HISTORY ? ?Chief Complaint ?Post-op Problem ? ? ?HPI ?Reginald Walsh is a 56 y.o. male with PMH reviewed below presents to the ED with right hip pain with thigh redness/swelling and purulent drainage from his right hip incision. Patient with s/p total hip replacement with Dr. Alvan Dame on 3/20. Denies any immediate complication from surgery. Noted that he has developed some fatigue and body aches over the last 2 days but attributed that to his recent shingles vaccine but then noticed that the incision area as become red and is draining.   ? ? ?Past Medical History:  ?Diagnosis Date  ? 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[place]) 03/2019  ? 40 mm by echo  ? Chest pain of unknown etiology 02/2019  ? normal coronaries after abnormal Myoview  ? COPD (chronic obstructive pulmonary disease) ([place])   ? Hypertension   ? normal RA dopplers  ? ? ?Review of Systems ? ?Constitutional: Positive subjective fever/chills with body aches.  ?Eyes: No visual changes. ?ENT: No sore throat. ?Cardiovascular: Denies chest pain. ?Respiratory: Denies shortness of breath. ?Gastrointestinal: No abdominal pain.  No nausea, no vomiting.  No diarrhea.  No constipation. ?Genitourinary: Negative for dysuria. ?Musculoskeletal: Negative for back pain. ?Skin: Redness to the right thigh and drainage from right hip incision.  ?Neurological: Negative for headaches, focal weakness or numbness. ? ? ?____________________________________________ ? ? ?PHYSICAL EXAM: ? ?VITAL SIGNS: ?ED Triage Vitals [05/09/21 0106]  ?Enc Vitals Group  ?   BP 128/70  ?   Pulse Rate 95  ?   Resp 19  ?   Temp 99.7 ?F (37.6 ?C)  ?   Temp Source Oral  ?   SpO2 95 %  ? ?Constitutional: Alert and oriented. Well appearing and in no acute distress. ?Eyes: Conjunctivae are normal.  ?Head: Atraumatic. ?Nose: No congestion/rhinnorhea. ?Mouth/Throat: Mucous membranes are moist.  ?Neck: No  stridor.   ?Cardiovascular: Normal rate, regular rhythm. Good peripheral circulation. Grossly normal heart sounds.   ?Respiratory: Normal respiratory effort.  No retractions. Lungs CTAB. ?Gastrointestinal: Soft and nontender. No distention.  ?Musculoskeletal: No lower extremity tenderness nor edema. No gross deformities of extremities. ?Neurologic:  Normal speech and language. No gross focal neurologic deficits are appreciated.  ?Skin:  Skin is warm and dry.  Diffuse cellulitis over the right anterior thigh.  The right hip incision has drop of purulent drainage noted. ? ? ?____________________________________________ ?  ?LABS ?(all labs ordered are listed, but only abnormal results are displayed) ? ?Labs Reviewed  ?COMPREHENSIVE METABOLIC PANEL - Abnormal; Notable for the following components:  ?    Result Value  ? Sodium 129 (*)   ? Chloride 97 (*)   ? Glucose, Bld 123 (*)   ? All other components within normal limits  ?CBC WITH DIFFERENTIAL/PLATELET - Abnormal; Notable for the following components:  ? RBC 3.34 (*)   ? Hemoglobin 10.5 (*)   ? HCT 30.3 (*)   ? All other components within normal limits  ?URINALYSIS, ROUTINE W REFLEX MICROSCOPIC - Abnormal; Notable for the following components:  ? APPearance HAZY (*)   ? Ketones, ur 5 (*)   ? Protein, ur 30 (*)   ? Bacteria, UA RARE (*)   ? All other components within normal limits  ?CULTURE, BLOOD (ROUTINE X 2)  ?CULTURE, BLOOD (ROUTINE X 2)  ?LACTIC ACID, PLASMA  ?LACTIC ACID, PLASMA  ? ? ?____________________________________________ ? ? ?  PROCEDURES ? ?Procedure(s) performed:  ? ?Procedures ? ?None  ?____________________________________________ ? ? ?INITIAL IMPRESSION / ASSESSMENT AND PLAN / ED COURSE ? ?Pertinent labs & imaging results that were available during my care of the patient were reviewed by me and considered in my medical decision making (see chart for details). ?  ?This patient is Presenting for Evaluation of cellulitis, which does require a range of  treatment options, and is a complaint that involves a high risk of morbidity and mortality. ? ?The Differential Diagnoses include surgical wound infection, septic hip, DVT, cellulitis, abscess. ? ?Critical Interventions-  ?  ?Medications  ?vancomycin (VANCOCIN) IVPB 1000 mg/200 mL premix (1,000 mg Intravenous New Bag/Given 05/09/21 0341)  ?doxycycline (VIBRA-TABS) tablet 100 mg (100 mg Oral Given 05/09/21 0342)  ?ondansetron [facility]) injection 4 mg (4 mg Intravenous Given 05/09/21 0501)  ?HYDROcodone-acetaminophen (NORCO) 10-325 MG per tablet 1 tablet (1 tablet Oral Given 05/09/21 0502)  ? ? ?Reassessment after intervention: Vitals remain WNL.  ? ? ?I did obtain Additional Historical Information from wife at bedside. ? ?I decided to review pertinent External Data, and in summary patient with unrelated admit on 04/23/21. ?  ?Clinical Laboratory Tests Ordered, included mild hyponatremia 129.  No leukocytosis.  Lactic acid is normal at 1.0.  Normal UA. Blood cultures sent.  ? ? ?Social Determinants of Health Risk patient with a smoking history.  ? ?Consult complete with [facility], Mechele Claude.  Discussed the patient's presentation and my exam at bedside.  We discussed various management options including admission. Advised to start IV abx and patient can be seen in the ortho office by Dr. Alvan Dame later this AM. No specific imaging advised. ? ?Medical Decision Making: Summary:  ?Patient presents emergency department with right thigh cellulitis with drainage from the surgical wound.  Suspect wound infection.  Per discussion with orthopedics as above, biotics and close outpatient follow-up later today with the surgeon.  Patient has no leukocytosis, lactic acid is normal, no SIRS vitals to suspect developing sepsis.  ? ?Reevaluation with update and discussion with patient. Plan for IV Vancomycin. Doxycycline sent to pharmacy. Discussed plan for ortho f/u this AM and NPO status pending that appointment. Home pain meds given  here. Patient tolerating that well.  ? ?Disposition: discharge ? ?____________________________________________ ? ?FINAL CLINICAL IMPRESSION(S) / ED DIAGNOSES ? ?Final diagnoses:  ?Cellulitis of right thigh  ? ? ? ?NEW OUTPATIENT MEDICATIONS STARTED DURING THIS VISIT: ? ?New Prescriptions  ? DOXYCYCLINE (VIBRAMYCIN) 100 MG CAPSULE    Take 1 capsule (100 mg total) by mouth 2 (two) times daily for 7 days.  ? ? ?Note:  This document was prepared using Dragon voice recognition software and may include unintentional dictation errors. ? ?Nanda Quinton, MD, FACEP ?Emergency Medicine ? ?  ?Margette Fast, MD ?05/09/21 [phone] ? ?

## 2021-05-09 NOTE — ED Triage Notes (Signed)
Patient had a right hip replacement on March 20, complaints of redness and yellow drainage from surgical site and flu like symptoms over the last few days (2nd shingles vaccine given this week as well)  ?Last dose of naproxen and tylenol at 11:30pm.  ?

## 2021-05-14 LAB — CULTURE, BLOOD (ROUTINE X 2)
Culture: NO GROWTH
Culture: NO GROWTH
Special Requests: ADEQUATE
Special Requests: ADEQUATE

## 2021-06-17 ENCOUNTER — Ambulatory Visit (HOSPITAL_COMMUNITY)
Admission: RE | Admit: 2021-06-17 | Discharge: 2021-06-17 | Disposition: A | Discharge status: Home / Self Care | Payer: Managed Care, Other (non HMO) | Source: Ambulatory Visit | Attending: Internal Medicine | Admitting: Internal Medicine

## 2021-06-17 DIAGNOSIS — I77810 Thoracic aortic ectasia: Secondary | ICD-10-CM | POA: Diagnosis present

## 2021-06-17 DIAGNOSIS — I712 Thoracic aortic aneurysm, without rupture, unspecified: Secondary | ICD-10-CM | POA: Diagnosis present

## 2021-06-17 MED ORDER — IOHEXOL 350 MG/ML SOLN
100.0000 mL | Freq: Once | INTRAVENOUS | Status: AC | PRN
  Administered 2021-06-17: 100 mL via INTRAVENOUS

## 2021-06-18 ENCOUNTER — Encounter (HOSPITAL_BASED_OUTPATIENT_CLINIC_OR_DEPARTMENT_OTHER): Payer: Self-pay

## 2021-06-26 ENCOUNTER — Ambulatory Visit (INDEPENDENT_AMBULATORY_CARE_PROVIDER_SITE_OTHER): Payer: Managed Care, Other (non HMO) | Admitting: Family

## 2021-06-26 ENCOUNTER — Encounter (HOSPITAL_BASED_OUTPATIENT_CLINIC_OR_DEPARTMENT_OTHER): Payer: Self-pay | Admitting: Family

## 2021-06-26 VITALS — BP 116/72 | HR 68 | Ht 75.0 in | Wt 251.4 lb

## 2021-06-26 DIAGNOSIS — G5601 Carpal tunnel syndrome, right upper limb: Secondary | ICD-10-CM | POA: Diagnosis not present

## 2021-06-26 DIAGNOSIS — R5381 Other malaise: Secondary | ICD-10-CM

## 2021-06-26 DIAGNOSIS — R0602 Shortness of breath: Secondary | ICD-10-CM

## 2021-06-26 DIAGNOSIS — I10 Essential (primary) hypertension: Secondary | ICD-10-CM

## 2021-06-26 DIAGNOSIS — Z6831 Body mass index (BMI) 31.0-31.9, adult: Secondary | ICD-10-CM

## 2021-06-26 MED ORDER — CARVEDILOL 25 MG PO TABS: 25.0000 mg | ORAL_TABLET | Freq: Two times a day (BID) | ORAL | 3 refills | Status: DC

## 2021-06-26 NOTE — Patient Instructions (Signed)
Medication Instructions:  Continue current medications.   *If you need a refill on your cardiac medications before your next appointment, please call your pharmacy*   Lab Work: Your physician recommends that you return for lab work in 6 months for liver enzymes.   Please return for Lab work. You may come to the...   [facility] (3rd floor) [address] [place], [place] [address]  Open: 8am-Noon and 1pm-4:30pm   [place] [facility] [place]- Any location  **no appointments needed**   If you have labs (blood work) drawn today and your tests are completely normal, you will receive your results only by: [employer] (if you have MyChart) OR A paper copy in the mail If you have any lab test that is abnormal or we need to change your treatment, we will call you to review the results.   Testing/Procedures: Your physician has recommended that you have a pulmonary function test. Pulmonary Function Tests are a group of tests that measure how well air moves in and out of your lungs.  Follow-Up: At [facility], you and your health needs are our priority.  As part of our continuing mission to provide you with exceptional heart care, we have created designated Provider Care Teams.  These Care Teams include your primary Cardiologist (physician) and Advanced Practice Providers (APPs -  Physician Assistants and Nurse Practitioners) who all work together to provide you with the care you need, when you need it.  We recommend signing up for the patient portal called "MyChart".  Sign up information is provided on this After Visit Summary.  MyChart is used to connect with patients for Virtual Visits (Telemedicine).  Patients are able to view lab/test results, encounter notes, upcoming appointments, etc.  Non-urgent messages can be sent to your provider as well.   To learn more about what you can do with MyChart, go to  [email or web address].    Your next appointment:   1 year(s)  The format for your next appointment:   In Person  Provider:   Elouise Munroe, MD or Loel Dubonnet, NP     Other Instructions  Heart Healthy Diet Recommendations: A low-salt diet is recommended. Meats should be grilled, baked, or boiled. Avoid fried foods. Focus on lean protein sources like fish or chicken with vegetables and fruits. The American Heart Association is a Microbiologist!  American Heart Association Diet and Lifeystyle Recommendations   Exercise recommendations: The American Heart Association recommends 150 minutes of moderate intensity exercise weekly. Try 30 minutes of moderate intensity exercise 4-5 times per week. This could include walking, jogging, or swimming.

## 2021-06-26 NOTE — Progress Notes (Signed)
Office Visit    Patient Name: Reginald Walsh Date of Encounter: 06/26/2021  PCP:  Clinic, [facility]  Cardiologist:  Elouise Munroe, MD  Advanced Practice Provider:  No care team member to display Electrophysiologist:  None   Chief Complaint    Reginald Walsh is a 56 y.o. male with a hx of normal coronary arteries by cardiac catheterization 03/2019, hypertension, GERD presents today for follow-up after CT  Past Medical History    Past Medical History:  Diagnosis Date   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[place]) 03/2019   40 mm by echo   Chest pain of unknown etiology 02/2019   normal coronaries after abnormal Myoview   COPD (chronic obstructive pulmonary disease) ([place])    Hypertension    normal RA dopplers   Hyponatremia    Overweight    Varicose veins of both legs with edema    Past Surgical History:  Procedure Laterality Date   LEFT HEART CATH AND CORONARY ANGIOGRAPHY N/A 03/21/2019   Procedure: LEFT HEART CATH AND CORONARY ANGIOGRAPHY;  Surgeon: Burnell Blanks, MD;  Location: [facility];  Service: Cardiovascular;  Laterality: N/A;   LUMBAR LAMINECTOMY     REVISION TOTAL HIP ARTHROPLASTY     TENDON RELEASE     May 2018   Allergies  Allergies  Allergen Reactions   Escitalopram Oxalate Anxiety    History of Present Illness    Reginald Walsh is a 56 y.o. male with a hx of normal coronary arteries by cardiac catheterization 03/2019, hypertension, GERD last seen 07/24/20 by Dr. Arcola Jansky.  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  He additionally follows with the [place].  He has followed with Dr. Margaretann Loveless since February 2021 for episode of chest pain.  He had abnormal EKG with inferior Q waves.  Exercise Myoview 03/14/2019 low risk with LVEF 45-54% but has basilar infiltrate and mild inferior defect of medium severity.   Underwent diagnostic cath 03/01/2019 with normal coronaries.  Echocardiogram 04/18/2019 normal LVEF, mild LVH, dilated aortic root at 40 mm.  CTA 05/2020 showed 22 mm aortic root dilation.  He was seen 06/2020 doing overall well from cardiac perspective.  He was recommended for repeat CTA in one year.  November 2022 he noted elevated blood pressure via MyChart message he was transition from metoprolol to carvedilol.  Admitted 3/27 - 04/23/2021 after presenting with hyponatremia.  He had right hip arthroplasty the week prior.  Hydrochlorothiazide discontinued.  ED visit 04/26/2021 for cellulitis in the right hip treated with IV vancomycin and discharged on doxycycline course.  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 Margaretann Loveless.  Moderate compression deformity involving the superior endplate of T8 body without fracture line favored to be subacute/chronic in etiology.  Suspected hepatic steatosis with nodularity of hepatic contour recommended for correlation with LFT.  05/09/2021 AST 15, ALT 15, alkaline phosphatase 93, K 3.9, Na 129.  Presents today for follow up independently.  Very pleasant gentleman who shares with me that he recently became a [employer]. We reviewed CT scan in detail. He has upcoming follow up with Dr. Davy Pique of neurosurgery and plans to review CT imaging of the T8. Does note he was diagnosed with carpal tunnel by the [facility]  6 years ago and now is having right hand pain interested in seeing orthopedics. Encouraged to trial wrist brace at night. Notes increased dyspnea over the last few months requiring more usage of his inhaler and requests updated PFTs, last done by [place] >1 year ago. He does not follow with pulmonology.Reports no chest pain, pressure, or tightness. No edema, orthopnea, PND. Reports no palpitations. BP has been well controlled on Carvedilol. He is  interested in increasing physical activity and has no restrictions per orthopedics after right hip surgery.   EKGs/Labs/Other Studies Reviewed:   The following studies were reviewed today:  Echo 04/18/19  1. Left ventricular ejection fraction, by estimation, is 50 to 55%. The  left ventricle has low normal function. The left ventricle has no regional  wall motion abnormalities. There is mild left ventricular hypertrophy.  Left ventricular diastolic  parameters were normal. The average left ventricular global longitudinal  strain is -18.5 %.   2. Right ventricular systolic function is normal. The right ventricular  size is mildly enlarged. There is normal pulmonary artery systolic  pressure. The estimated right ventricular systolic pressure is 74.1 mmHg.   3. The mitral valve is normal in structure. No evidence of mitral valve  regurgitation.   4. The aortic valve is tricuspid. Aortic valve regurgitation is not  visualized. No aortic stenosis is present.   5. Aortic dilatation noted. There is dilatation of the ascending aorta  measuring 40 mm.   6. The inferior vena cava is normal in size with greater than 50%  respiratory variability, suggesting right atrial pressure of 3 mmHg.    EKG: No EKG today.  Recent Labs: 04/21/2021: TSH 1.071 04/22/2021: Magnesium 1.7 05/09/2021: ALT 15; BUN 14; Creatinine, Ser 1.04; Hemoglobin 10.5; Platelets 237; Potassium 3.9; Sodium 129  Recent Lipid Panel    Component Value Date/Time   CHOL 253 (H) 07/02/2020 0846   TRIG 304 (H) 07/02/2020 0846   HDL 70 07/02/2020 0846   CHOLHDL 3.6 07/02/2020 0846   CHOLHDL 3 12/11/2019 0828   VLDL 19.2 12/11/2019 0828   LDLCALC 130 (H) 07/02/2020 0846     Home Medications   Current Meds  Medication Sig   albuterol (VENTOLIN HFA) 108 (90 Base) MCG/ACT inhaler Inhale 2 puffs into the lungs every 6 (six) hours as needed for wheezing or shortness of breath.   aspirin EC 81 MG tablet Take 81 mg by mouth daily.  Swallow whole.   augmented betamethasone dipropionate (DIPROLENE-AF) 0.05 % cream Apply 1 application topically 2 (two) times daily as needed (eczema).   Carboxymethylcellulose Sod PF 0.5 % SOLN Apply 1 drop to eye 4 (four) times daily as needed (dry eyes).   diclofenac Sodium (VOLTAREN) 1 % GEL Apply 1 application topically daily as needed (pain).   DULoxetine (CYMBALTA) 60 MG capsule Take 60 mg by mouth daily.   fluticasone (FLONASE) 50 MCG/ACT nasal spray Place 2 sprays into both nostrils in the morning and at bedtime.   HYDROcodone-acetaminophen (NORCO) 10-325 MG tablet Take 1 tablet by mouth every 8 (eight) hours as needed (chronic pain).   Ipratropium-Albuterol (COMBIVENT) 20-100 MCG/ACT AERS respimat Inhale 1 puff into the lungs in the morning, at noon, in the evening, and at bedtime.   Multiple Vitamins-Minerals (MENS MULTIVITAMIN PLUS PO) Take 1 tablet by mouth daily.    Omega-3 Fatty Acids (OMEGA 3 PO) Take 1 capsule by mouth daily.   pantoprazole (PROTONIX) 40 MG tablet Take 40 mg by mouth daily.   pregabalin (LYRICA) 150  MG capsule Take 150 mg by mouth 2 (two) times daily.    rosuvastatin (CRESTOR) 20 MG tablet Take 1 tablet (20 mg total) by mouth daily.   tiZANidine (ZANAFLEX) 2 MG tablet Take 2 mg by mouth every 6 (six) hours as needed for muscle spasms.     Review of Systems  All other systems reviewed and are otherwise negative except as noted above.  Physical Exam    VS:  BP 116/72   Pulse 68   Ht '6\' 3"'$  (1.905 m)   Wt 251 lb 6.4 oz (114 kg)   BMI 31.42 kg/m  , BMI Body mass index is 31.42 kg/m.  Wt Readings from Last 3 Encounters:  06/26/21 251 lb 6.4 oz (114 kg)  04/21/21 250 lb 3.6 oz (113.5 kg)  07/24/20 249 lb (112.9 kg)    GEN: Well nourished, overweight, well developed, in no acute distress. HEENT: normal. Neck: Supple, no JVD, carotid bruits, or masses. Cardiac: RRR, no murmurs, rubs, or gallops. No clubbing, cyanosis. Trace pedal edema bilaterally.   Radials/PT 2+ and equal bilaterally.  Respiratory:  Respirations regular and unlabored, clear to auscultation bilaterally. GI: Soft, nontender, nondistended. MS: No deformity or atrophy. Skin: Warm and dry, no rash. Varicose veins to bilateral lower extremity. Neuro:  Strength and sensation are intact. Psych: Normal affect.  Assessment & Plan    LE edema / Varicose veins -prior increased swelling with amlodipine which has since been discontinued.  Lasix previously discontinued due to leg cramps.  Hydrochlorothiazide previously discontinued due to hypokalemia, hyponatremia.  He had mild edema after right hip surgery but this is since resolved.    Recommend elevation, compression stockings, low sodium diet.   HTN - BP well controlled. Continue current antihypertensive regimen carvedilol 25 mg twice daily.   Previous intolerances include Amlodipine (LE edema) and Losartan (ineffective).   Ascending aorta enlargement -05/2021 CT stable ascending aorta 40 mm.  Repeat CT in 2 years per recommendations of Dr. Margaretann Loveless as has been overall stable.  Continue optimal blood pressure control.  Hyponatremia -noted After recent hip surgery.  Tells me that he had an increase of hydrochlorothiazide for unknown reason.  Hydrochlorothiazide has since been discontinued. Recommend restrict to <2L fluid intake per day. continue to follow with PCP.   Back pain -CT 06/16/2021 with compression of T8 vertebral body.  Has upcoming follow-up with Dr. Davy Pique.  HLD - Continue rosuvastatin 20 mg daily.  LDL goal less than 100. Due for repeat lipid - will inquire with PCP with labs anticipated at upcoming visit.   Carpal tunnel -diagnosed 6 years ago per his report by the [place].  Notes more recently he has started having right hand pain.  Encouraged to wear wrist brace at night.  We will refer him to the hand center for management of carpal tunnel.  Shortness of breath / COPD -notes worsening shortness of breath over the past  couple months requiring more utilization of his inhaler.  Will order PFTs.  If abnormal plan to refer to pulmonology.  Previously followed by [facility] primary care provider but prefers to follow locally.  Overweight / BMI 31 / deconditioning - Weight loss via diet and exercise encouraged. Discussed the impact being overweight would have on cardiovascular risk. Refer to [facility] for exercise.   Hepatic steatosis -CT 05/2021 with suspected hepatic steatosis with nodular hepatic contour.  04/2021 normal LFT.  He is reducing his fried food and intake of beer.  Plan for repeat LFTs in 6 months.  Disposition: Follow up in 1 year with Dr. Margaretann Loveless or APP.   Signed, Loel Dubonnet, NP 06/26/2021, 1:32 PM  [facility]

## 2021-06-27 ENCOUNTER — Telehealth (HOSPITAL_BASED_OUTPATIENT_CLINIC_OR_DEPARTMENT_OTHER): Payer: Self-pay | Admitting: Family

## 2021-06-27 NOTE — Telephone Encounter (Signed)
Spoke with patient regarding the Wednesday 07/02/21 8:00 am PFT appointment at [facility]--arrival time is 7:45 am --1st floor admissions office for check in---No inhalers, caffeine or breathing medications 4 hours prior to testing.  Patient voiced his understanding

## 2021-07-01 ENCOUNTER — Encounter: Payer: Self-pay | Admitting: Family

## 2021-07-01 ENCOUNTER — Ambulatory Visit (INDEPENDENT_AMBULATORY_CARE_PROVIDER_SITE_OTHER): Payer: Managed Care, Other (non HMO) | Admitting: Family

## 2021-07-01 VITALS — BP 100/64 | HR 60 | Temp 97.8°F | Ht 75.0 in | Wt 248.5 lb

## 2021-07-01 DIAGNOSIS — H524 Presbyopia: Secondary | ICD-10-CM | POA: Insufficient documentation

## 2021-07-01 DIAGNOSIS — M7660 Achilles tendinitis, unspecified leg: Secondary | ICD-10-CM | POA: Insufficient documentation

## 2021-07-01 DIAGNOSIS — F6081 Narcissistic personality disorder: Secondary | ICD-10-CM | POA: Insufficient documentation

## 2021-07-01 DIAGNOSIS — M87851 Other osteonecrosis, right femur: Secondary | ICD-10-CM | POA: Insufficient documentation

## 2021-07-01 DIAGNOSIS — R69 Illness, unspecified: Secondary | ICD-10-CM | POA: Insufficient documentation

## 2021-07-01 DIAGNOSIS — Z96642 Presence of left artificial hip joint: Secondary | ICD-10-CM | POA: Insufficient documentation

## 2021-07-01 DIAGNOSIS — Z1152 Encounter for screening for COVID-19: Secondary | ICD-10-CM | POA: Insufficient documentation

## 2021-07-01 DIAGNOSIS — Z9109 Other allergy status, other than to drugs and biological substances: Secondary | ICD-10-CM

## 2021-07-01 DIAGNOSIS — T819XXA Unspecified complication of procedure, initial encounter: Secondary | ICD-10-CM | POA: Insufficient documentation

## 2021-07-01 DIAGNOSIS — J329 Chronic sinusitis, unspecified: Secondary | ICD-10-CM | POA: Insufficient documentation

## 2021-07-01 DIAGNOSIS — J301 Allergic rhinitis due to pollen: Secondary | ICD-10-CM

## 2021-07-01 DIAGNOSIS — F17200 Nicotine dependence, unspecified, uncomplicated: Secondary | ICD-10-CM | POA: Insufficient documentation

## 2021-07-01 DIAGNOSIS — E559 Vitamin D deficiency, unspecified: Secondary | ICD-10-CM | POA: Insufficient documentation

## 2021-07-01 DIAGNOSIS — F419 Anxiety disorder, unspecified: Secondary | ICD-10-CM | POA: Insufficient documentation

## 2021-07-01 DIAGNOSIS — M2140 Flat foot [pes planus] (acquired), unspecified foot: Secondary | ICD-10-CM | POA: Insufficient documentation

## 2021-07-01 DIAGNOSIS — F32A Depression, unspecified: Secondary | ICD-10-CM | POA: Insufficient documentation

## 2021-07-01 DIAGNOSIS — J45909 Unspecified asthma, uncomplicated: Secondary | ICD-10-CM | POA: Insufficient documentation

## 2021-07-01 DIAGNOSIS — T888XXA Other specified complications of surgical and medical care, not elsewhere classified, initial encounter: Secondary | ICD-10-CM

## 2021-07-01 DIAGNOSIS — F1011 Alcohol abuse, in remission: Secondary | ICD-10-CM | POA: Insufficient documentation

## 2021-07-01 DIAGNOSIS — L309 Dermatitis, unspecified: Secondary | ICD-10-CM | POA: Insufficient documentation

## 2021-07-01 DIAGNOSIS — F1010 Alcohol abuse, uncomplicated: Secondary | ICD-10-CM | POA: Insufficient documentation

## 2021-07-01 DIAGNOSIS — M79671 Pain in right foot: Secondary | ICD-10-CM

## 2021-07-01 DIAGNOSIS — M25562 Pain in left knee: Secondary | ICD-10-CM

## 2021-07-01 DIAGNOSIS — E785 Hyperlipidemia, unspecified: Secondary | ICD-10-CM | POA: Diagnosis not present

## 2021-07-01 DIAGNOSIS — Z96641 Presence of right artificial hip joint: Secondary | ICD-10-CM | POA: Diagnosis not present

## 2021-07-01 DIAGNOSIS — D3131 Benign neoplasm of right choroid: Secondary | ICD-10-CM | POA: Insufficient documentation

## 2021-07-01 DIAGNOSIS — K591 Functional diarrhea: Secondary | ICD-10-CM | POA: Insufficient documentation

## 2021-07-01 DIAGNOSIS — K529 Noninfective gastroenteritis and colitis, unspecified: Secondary | ICD-10-CM | POA: Insufficient documentation

## 2021-07-01 DIAGNOSIS — R591 Generalized enlarged lymph nodes: Secondary | ICD-10-CM

## 2021-07-01 DIAGNOSIS — J309 Allergic rhinitis, unspecified: Secondary | ICD-10-CM | POA: Insufficient documentation

## 2021-07-01 DIAGNOSIS — L03115 Cellulitis of right lower limb: Secondary | ICD-10-CM | POA: Insufficient documentation

## 2021-07-01 DIAGNOSIS — IMO0002 Reserved for concepts with insufficient information to code with codable children: Secondary | ICD-10-CM | POA: Insufficient documentation

## 2021-07-01 HISTORY — DX: Pain in left knee: M25.562

## 2021-07-01 HISTORY — DX: Pain in right foot: M79.671

## 2021-07-01 HISTORY — DX: Generalized enlarged lymph nodes: R59.1

## 2021-07-01 HISTORY — DX: Encounter for screening for COVID-19: Z11.52

## 2021-07-01 HISTORY — DX: Other allergy status, other than to drugs and biological substances: Z91.09

## 2021-07-01 HISTORY — DX: Illness, unspecified: R69

## 2021-07-01 HISTORY — DX: Other specified complications of surgical and medical care, not elsewhere classified, initial encounter: T88.8XXA

## 2021-07-01 HISTORY — DX: Functional diarrhea: K59.1

## 2021-07-01 MED ORDER — FLUTICASONE PROPIONATE 50 MCG/ACT NA SUSP
2.0000 | Freq: Two times a day (BID) | NASAL | 5 refills | Status: AC
Start: 2021-07-01 — End: ?

## 2021-07-01 NOTE — Assessment & Plan Note (Addendum)
pt had right THR on 3/20 by Dr. Alvan Dame - Hyponatremia on preop labs thought to be due to HCTZ, d/c'd, pt had f/u labs post surgery & still low Na, K, Mg. Pt admitted for 2 days and sent home on supplements. Then developed wound infection, went back to ER on 4/14 and given  IV Vanc & oral DOXY. Reports wound draining so went again to be seen, UC? and started on Cefadroxil, pt still taking. Reports still having pain at the site and concerned infection is still present, states he has f/u appt with Alvan Dame tomorrow. Advised on asking for culture of wound, asking for ID to get involved possibly, may need MRI.

## 2021-07-01 NOTE — Assessment & Plan Note (Deleted)
Chronic - taking Crestor qd, reports having labs done via [place] in [place] in April.

## 2021-07-01 NOTE — Progress Notes (Signed)
New Patient Office Visit  Subjective:  Patient ID: Reginald Walsh, male    DOB: 1965/02/19  Age: 56 y.o. MRN: [id number]  CC:  Chief Complaint  Patient presents with   Establish Care    No concerns, Labs drawn in [facility] at [facility]    HPI Reginald Walsh presents for establishing care today. Pain He reports new onset right hip pain. There was not an injury that may have caused the pain. The pain started a few months ago and is gradually worsening. The pain does not radiate. The pain is described as burning, sharp, soreness, and stabbing, occurring constantly. Aggravating factors: sitting, standing, and walking He has tried prescription pain relievers with little relief. pt reports having cellulitis at incision site after surgery, took Keflex, then started having wound drainage "pouring out" last week and seen in UC and started on new abt. Has appt with surgeon tomorrow. Allergic Rhinitis: Reginald Walsh is here for evaluation of possible allergic rhinitis. Patient's symptoms include clear rhinorrhea, nasal congestion, and postnasal drip. These symptoms are perennial with seasonal exacerbation. Current triggers include exposure to pollens. The patient has been suffering from these symptoms for approximately  years. The patient has tried prescription antihistamines and prescription nasal sprays with adequate relief of symptoms.The patient has a history of asthma. The patient does not suffer from frequent sinopulmonary infections. The patient has not had sinus surgery in the past. The patient has a history of eczema.  Assessment & Plan:   Problem List Items Addressed This Visit       Respiratory   Allergic rhinitis - Primary   Relevant Medications   fluticasone (FLONASE) 50 MCG/ACT nasal spray     Other   S/P total right hip arthroplasty    pt had right THR on 3/20 by Dr. Alvan Dame - Hyponatremia on preop labs thought to be due to HCTZ, d/c'd, pt had f/u labs post surgery & still low Na, K, Mg. Pt  admitted for 2 days and sent home on supplements. Then developed wound infection, went back to ER on 4/14 and given  IV Vanc & oral DOXY. Reports wound draining so went again to be seen, UC? and started on Cefadroxil, pt still taking. Reports still having pain at the site and concerned infection is still present, states he has f/u appt with Alvan Dame tomorrow. Advised on asking for culture of wound, asking for ID to get involved possibly, may need MRI.        Outpatient Medications Prior to Visit  Medication Sig Dispense Refill   albuterol (VENTOLIN HFA) 108 (90 Base) MCG/ACT inhaler Inhale 2 puffs into the lungs every 6 (six) hours as needed for wheezing or shortness of breath.     augmented betamethasone dipropionate (DIPROLENE-AF) 0.05 % cream Apply 1 application topically 2 (two) times daily as needed (eczema).     Carboxymethylcellulose Sod PF 0.5 % SOLN Apply 1 drop to eye 4 (four) times daily as needed (dry eyes).     carvedilol (COREG) 25 MG tablet Take 1 tablet (25 mg total) by mouth 2 (two) times daily. 180 tablet 3   cefadroxil (DURICEF) 500 MG capsule Take 500 mg by mouth every 12 (twelve) hours.     diclofenac Sodium (VOLTAREN) 1 % GEL Apply 1 application topically daily as needed (pain).     DULoxetine (CYMBALTA) 60 MG capsule Take 60 mg by mouth daily.     HYDROcodone-acetaminophen (NORCO) 10-325 MG tablet Take 1 tablet by mouth every 8 (  eight) hours as needed (chronic pain).     Ipratropium-Albuterol (COMBIVENT) 20-100 MCG/ACT AERS respimat Inhale 1 puff into the lungs in the morning, at noon, in the evening, and at bedtime.     methocarbamol (ROBAXIN) 500 MG tablet Take 500 mg by mouth every 6 (six) hours as needed.     Multiple Vitamins-Minerals (MENS MULTIVITAMIN PLUS PO) Take 1 tablet by mouth daily.      pantoprazole (PROTONIX) 40 MG tablet Take 40 mg by mouth daily.     pregabalin (LYRICA) 150 MG capsule Take 150 mg by mouth 2 (two) times daily.      rosuvastatin (CRESTOR) 20 MG  tablet Take 1 tablet (20 mg total) by mouth daily. 90 tablet 3   aspirin EC 81 MG tablet Take 81 mg by mouth daily. Swallow whole.     fluticasone (FLONASE) 50 MCG/ACT nasal spray Place 2 sprays into both nostrils in the morning and at bedtime.     Omega-3 Fatty Acids (OMEGA 3 PO) Take 1 capsule by mouth daily.     tiZANidine (ZANAFLEX) 2 MG tablet Take 2 mg by mouth every 6 (six) hours as needed for muscle spasms.     nitroGLYCERIN (NITROSTAT) 0.4 MG SL tablet Place 1 tablet (0.4 mg total) under the tongue every 5 (five) minutes as needed for chest pain. 15 tablet 3   No facility-administered medications prior to visit.    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   40 mm by echo   Chest pain of uncertain etiology    Normal coronaries after abnormal Myoview Feb 2021 Echo March 2021 showed normal LVF- mild LVH    Chest pain of unknown etiology 02/2019   normal coronaries after abnormal Myoview   COPD (chronic obstructive pulmonary disease) ([place])    Encounter for screening for COVID-19 07/01/2021   Functional diarrhea 07/01/2021   Generalized enlarged lymph nodes 07/01/2021   Hypertension    normal RA dopplers   Hyponatremia    Other allergy status, other than to drugs and biological substances 07/01/2021   Other and unspecified complications of medical care, not elsewhere classified 07/01/2021   Other ill-defined and unknown causes of morbidity and mortality 07/01/2021   Overweight    Pain in left knee 07/01/2021   Pain in right foot 07/01/2021   Varicose veins of both legs with edema     Past Surgical History:  Procedure Laterality Date   LEFT HEART CATH AND CORONARY ANGIOGRAPHY N/A 03/21/2019   Procedure: LEFT HEART CATH AND CORONARY ANGIOGRAPHY;  Surgeon: Burnell Blanks, MD;  Location: [facility];  Service: Cardiovascular;  Laterality: N/A;   LUMBAR LAMINECTOMY     REVISION TOTAL HIP ARTHROPLASTY     TENDON  RELEASE     May 2018   WISDOM TOOTH EXTRACTION      Objective:   Today's Vitals: BP 100/64 (BP Location: Left Arm, Patient Position: Sitting, Cuff Size: Large)   Pulse 60   Temp 97.8 F (36.6 C) (Temporal)   Ht '6\' 3"'$  (1.905 m)   Wt 248 lb 8 oz (112.7 kg)   SpO2 97%   BMI 31.06 kg/m   Physical Exam Vitals and nursing note reviewed.  Constitutional:      General: He is not in acute distress.    Appearance: Normal appearance.  HENT:     Head: Normocephalic.  Cardiovascular:     Rate and Rhythm:  Normal rate and regular rhythm.  Pulmonary:     Effort: Pulmonary effort is normal.     Breath sounds: Normal breath sounds.  Musculoskeletal:     Cervical back: Normal range of motion.     Right hip: Tenderness present. Decreased range of motion (using cane).  Skin:    General: Skin is warm and dry.  Neurological:     Mental Status: He is alert and oriented to person, place, and time.  Psychiatric:        Mood and Affect: Mood normal.    Meds ordered this encounter  Medications   fluticasone (FLONASE) 50 MCG/ACT nasal spray    Sig: Place 2 sprays into both nostrils in the morning and at bedtime.    Dispense:  18.2 mL    Refill:  5    Order Specific Question:   Supervising Provider    Answer:   ANDY, CAMILLE L [[id number]]    *Extra time (79mn) spent with patient today which consisted of chart review (recent ER & Cardiology visits), discussing diagnoses, treatment, answering questions, and documentation.  Follow-up: Return for any future concerns.   HJeanie Sewer NP

## 2021-07-01 NOTE — Patient Instructions (Signed)
Welcome to [facility], It was a pleasure meeting you today!  As discussed, I have sent your Flonase refill to your pharmacy.  Good luck with getting your right hip infection resolved.  I would definitely ask your surgeon about getting a wound culture done and asking if [facility] infectious disease can get involved for quicker resolution! Also see if they will do a follow up lab test (CBC) to check on your anemia.  If not, you can schedule another day with our lab, non-fasting.  Let us know if you have any future concerns.     PLEASE NOTE: If you had any LAB tests please let us know if you have not heard back within a few days. You may see your results on MyChart before we have a chance to review them but we will give you a call once they are reviewed by [place]. If we ordered any REFERRALS today, please let us know if you have not heard from their office within the next week.  Let us know through MyChart if you are needing REFILLS, or have your pharmacy send [place] the request. You can also use MyChart to communicate with me or any office staff.          PLEASE NOTE:  If you had any lab tests please let us know if you have not heard back within a few days. You may see your results on MyChart before we have a chance to review them but we will give you a call once they are reviewed by [place]. If we ordered any referrals today, please let us know if you have not heard from their office within the next week.

## 2021-07-02 ENCOUNTER — Encounter (HOSPITAL_COMMUNITY): Payer: No Typology Code available for payment source

## 2021-07-04 ENCOUNTER — Encounter (HOSPITAL_COMMUNITY): Payer: Self-pay

## 2021-07-04 NOTE — Patient Instructions (Signed)
DUE TO COVID-19 ONLY TWO VISITORS  (aged 57 and older)  ARE ALLOWED TO COME WITH YOU AND STAY IN THE WAITING ROOM ONLY DURING PRE OP AND PROCEDURE.   **NO VISITORS ARE ALLOWED IN THE SHORT STAY AREA OR RECOVERY ROOM!!**  IF YOU WILL BE ADMITTED INTO THE HOSPITAL YOU ARE ALLOWED ONLY FOUR SUPPORT PEOPLE DURING VISITATION HOURS ONLY (7 AM -8PM)   The support person(s) must pass our screening, gel in and out, and wear a mask at all times, including in the patient's room. Patients must also wear a mask when staff or their support person are in the room. Visitors GUEST BADGE MUST BE WORN VISIBLY  One adult visitor may remain with you overnight and MUST be in the room by 8 P.M.     Your procedure is scheduled on: 07/08/21   Report to [facility] Main Entrance    Report to admitting at  2:15 PM   Call this number if you have problems the morning of surgery [phone]   Do not eat food :After Midnight.   After Midnight you may have the following liquids until _2:00 PM/  DAY OF SURGERY  Water Black Coffee (sugar ok, NO MILK/CREAM OR CREAMERS)  Tea (sugar ok, NO MILK/CREAM OR CREAMERS) regular and decaf                             Plain Jell-O (NO RED)                                           Fruit ices (not with fruit pulp, NO RED)                                     Popsicles (NO RED)                                                                  Juice: apple, WHITE grape, WHITE cranberry Sports drinks like Gatorade (NO RED) Clear broth(vegetable,chicken,beef)                     The day of surgery:  Drink ONE (1) Pre-Surgery Clear Ensure  at 1:45 PM the morning of surgery. Drink in one sitting. Do not sip.  This drink was given to you during your hospital  pre-op appointment visit. Nothing else to drink after completing the  Pre-Surgery Clear Ensure at 2:00 pm          If you have questions, please contact your surgeon's office.   FOLLOW BOWEL PREP AND ANY  ADDITIONAL PRE OP INSTRUCTIONS YOU RECEIVED FROM YOUR SURGEON'S OFFICE!!!     Oral Hygiene is also important to reduce your risk of infection.                                    Remember - BRUSH YOUR TEETH THE MORNING OF SURGERY WITH YOUR REGULAR TOOTHPASTE   Do NOT smoke  after Midnight   Take these medicines the morning of surgery with A SIP OF WATER: Lyrica, Duloxetine, Rosuvastatin, Carvedilol, Pantoprazole, Norco if needed for pain and Flonase if needed   Bring CPAP mask and tubing day of surgery.                              You may not have any metal on your body including , jewelry, and body piercing             Do not wear lotions, powders, perfumes/cologne, or deodorant                 Men may shave face and neck.   Do not bring valuables to the hospital. [place].   Contacts, dentures or bridgework may not be worn into surgery.   Bring small overnight bag day of surgery.   DO NOT [place]. PHARMACY WILL DISPENSE MEDICATIONS LISTED ON YOUR MEDICATION LIST TO YOU DURING YOUR ADMISSION [place]!       Special Instructions: Bring a copy of your healthcare power of attorney and living will documents  the day of surgery if you haven't scanned them before.              Please read over the following fact sheets you were given: IF YOU HAVE QUESTIONS ABOUT YOUR PRE-OP INSTRUCTIONS PLEASE CALL [phone]     [facility] - Preparing for Surgery Before surgery, you can play an important role.  Because skin is not sterile, your skin needs to be as free of germs as possible.  You can reduce the number of germs on your skin by washing with CHG (chlorahexidine gluconate) soap before surgery.  CHG is an antiseptic cleaner which kills germs and bonds with the skin to continue killing germs even after washing. Please DO NOT use if you have an allergy to CHG or antibacterial soaps.  If your skin  becomes reddened/irritated stop using the CHG and inform your nurse when you arrive at Short Stay. You may shave your face/neck. Please follow these instructions carefully:  1.  Shower with CHG Soap the night before surgery and the  morning of Surgery.  2.  If you choose to wash your hair, wash your hair first as usual with your  normal  shampoo.  3.  After you shampoo, rinse your hair and body thoroughly to remove the  shampoo.                            4.  Use CHG as you would any other liquid soap.  You can apply chg directly  to the skin and wash                       Gently with a scrungie or clean washcloth.  5.  Apply the CHG Soap to your body ONLY FROM THE NECK DOWN.   Do not use on face/ open                           Wound or open sores. Avoid contact with eyes, ears mouth and genitals (private parts).  Wash face,  Genitals (private parts) with your normal soap.             6.  Wash thoroughly, paying special attention to the area where your surgery  will be performed.  7.  Thoroughly rinse your body with warm water from the neck down.  8.  DO NOT shower/wash with your normal soap after using and rinsing off  the CHG Soap.                9.  Pat yourself dry with a clean towel.            10.  Wear clean pajamas.            11.  Place clean sheets on your bed the night of your first shower and do not  sleep with pets. Day of Surgery : Do not apply any lotions/deodorants the morning of surgery.  Please wear clean clothes to the hospital/surgery center.  FAILURE TO FOLLOW THESE INSTRUCTIONS MAY RESULT IN THE CANCELLATION OF YOUR SURGERY    ________________________________________________________________________   Incentive Spirometer  An incentive spirometer is a tool that can help keep your lungs clear and active. This tool measures how well you are filling your lungs with each breath. Taking long deep breaths may help reverse or decrease the chance of  developing breathing (pulmonary) problems (especially infection) following: A long period of time when you are unable to move or be active. BEFORE THE PROCEDURE  If the spirometer includes an indicator to show your best effort, your nurse or respiratory therapist will set it to a desired goal. If possible, sit up straight or lean slightly forward. Try not to slouch. Hold the incentive spirometer in an upright position. INSTRUCTIONS FOR USE  Sit on the edge of your bed if possible, or sit up as far as you can in bed or on a chair. Hold the incentive spirometer in an upright position. Breathe out normally. Place the mouthpiece in your mouth and seal your lips tightly around it. Breathe in slowly and as deeply as possible, raising the piston or the ball toward the top of the column. Hold your breath for 3-5 seconds or for as long as possible. Allow the piston or ball to fall to the bottom of the column. Remove the mouthpiece from your mouth and breathe out normally. Rest for a few seconds and repeat Steps 1 through 7 at least 10 times every 1-2 hours when you are awake. Take your time and take a few normal breaths between deep breaths. The spirometer may include an indicator to show your best effort. Use the indicator as a goal to work toward during each repetition. After each set of 10 deep breaths, practice coughing to be sure your lungs are clear. If you have an incision (the cut made at the time of surgery), support your incision when coughing by placing a pillow or rolled up towels firmly against it. Once you are able to get out of bed, walk around indoors and cough well. You may stop using the incentive spirometer when instructed by your caregiver.  RISKS AND COMPLICATIONS Take your time so you do not get dizzy or light-headed. If you are in pain, you may need to take or ask for pain medication before doing incentive spirometry. It is harder to take a deep breath if you are having pain. AFTER  USE Rest and breathe slowly and easily. It can be helpful to keep track of a log  of your progress. Your caregiver can provide you with a simple table to help with this. If you are using the spirometer at home, follow these instructions: [place] IF:  You are having difficultly using the spirometer. You have trouble using the spirometer as often as instructed. Your pain medication is not giving enough relief while using the spirometer. You develop fever of 100.5 F (38.1 C) or higher. SEEK IMMEDIATE MEDICAL CARE IF:  You cough up bloody sputum that had not been present before. You develop fever of 102 F (38.9 C) or greater. You develop worsening pain at or near the incision site. MAKE SURE YOU:  Understand these instructions. Will watch your condition. Will get help right away if you are not doing well or get worse. Document Released: 05/25/2006 Document Revised: 04/06/2011 Document Reviewed: 07/26/2006 [facility] Patient Information 2014 [place], [place].   ________________________________________________________________________

## 2021-07-07 ENCOUNTER — Encounter (HOSPITAL_COMMUNITY): Payer: Self-pay

## 2021-07-07 ENCOUNTER — Encounter (HOSPITAL_COMMUNITY)
Admission: RE | Admit: 2021-07-07 | Discharge: 2021-07-07 | Disposition: A | Discharge status: Home / Self Care | Payer: Managed Care, Other (non HMO) | Source: Ambulatory Visit | Attending: Orthopedic Surgery | Admitting: Orthopedic Surgery

## 2021-07-07 ENCOUNTER — Other Ambulatory Visit: Payer: Self-pay

## 2021-07-07 DIAGNOSIS — J449 Chronic obstructive pulmonary disease, unspecified: Secondary | ICD-10-CM | POA: Insufficient documentation

## 2021-07-07 DIAGNOSIS — M01X51 Direct infection of right hip in infectious and parasitic diseases classified elsewhere: Secondary | ICD-10-CM | POA: Insufficient documentation

## 2021-07-07 DIAGNOSIS — I7111 Aneurysm of the ascending aorta, ruptured: Secondary | ICD-10-CM | POA: Insufficient documentation

## 2021-07-07 DIAGNOSIS — I10 Essential (primary) hypertension: Secondary | ICD-10-CM | POA: Insufficient documentation

## 2021-07-07 DIAGNOSIS — Z01812 Encounter for preprocedural laboratory examination: Secondary | ICD-10-CM | POA: Insufficient documentation

## 2021-07-07 DIAGNOSIS — Z87891 Personal history of nicotine dependence: Secondary | ICD-10-CM | POA: Insufficient documentation

## 2021-07-07 DIAGNOSIS — F1010 Alcohol abuse, uncomplicated: Secondary | ICD-10-CM | POA: Insufficient documentation

## 2021-07-07 DIAGNOSIS — Z96641 Presence of right artificial hip joint: Secondary | ICD-10-CM | POA: Insufficient documentation

## 2021-07-07 DIAGNOSIS — Z01818 Encounter for other preprocedural examination: Secondary | ICD-10-CM

## 2021-07-07 HISTORY — DX: Dyspnea, unspecified: R06.00

## 2021-07-07 LAB — COMPREHENSIVE METABOLIC PANEL
ALT: 24 U/L (ref 0–44)
AST: 23 U/L (ref 15–41)
Albumin: 3.5 g/dL (ref 3.5–5.0)
Alkaline Phosphatase: 100 U/L (ref 38–126)
Anion gap: 9 (ref 5–15)
BUN: 12 mg/dL (ref 6–20)
CO2: 27 mmol/L (ref 22–32)
Calcium: 9.3 mg/dL (ref 8.9–10.3)
Chloride: 102 mmol/L (ref 98–111)
Creatinine, Ser: 1.05 mg/dL (ref 0.61–1.24)
GFR, Estimated: >60 mL/min (ref >60)
Glucose, Bld: 98 mg/dL (ref 70–99)
Potassium: 4.6 mmol/L (ref 3.5–5.1)
Sodium: 138 mmol/L (ref 135–145)
Total Bilirubin: 0.5 mg/dL (ref 0.3–1.2)
Total Protein: 7.1 g/dL (ref 6.5–8.1)

## 2021-07-07 LAB — CBC
HCT: 36 % — ABNORMAL LOW (ref 39.0–52.0)
Hemoglobin: 11.6 g/dL — ABNORMAL LOW (ref 13.0–17.0)
MCH: 28.8 pg (ref 26.0–34.0)
MCHC: 32.2 g/dL (ref 30.0–36.0)
MCV: 89.3 fL (ref 80.0–100.0)
Platelets: 300 10*3/uL (ref 150–400)
RBC: 4.03 MIL/uL — ABNORMAL LOW (ref 4.22–5.81)
RDW: 14.8 % (ref 11.5–15.5)
WBC: 6.1 10*3/uL (ref 4.0–10.5)
nRBC: 0 % (ref 0.0–0.2)

## 2021-07-07 LAB — SURGICAL PCR SCREEN
MRSA, PCR: NEGATIVE
Staphylococcus aureus: NEGATIVE

## 2021-07-07 NOTE — Progress Notes (Signed)
Anesthesia Chart Review   Case: [id number] Date/Time: 07/08/21 1645   Procedure: EXCISIONAL AND NON EXCISIONAL DEBRIDEMENT HIP, POSSIBLE HEAD LINER EXCHANGE (Right: Hip) - OLIN TO FOLLOW   Anesthesia type: Spinal   Pre-op diagnosis: Right superfical hip infection   Location: [facility] ROOM 10 / [facility]   Surgeons: Paralee Cancel, MD       DISCUSSION:56 y.o. former smoker with h/o HTN, COPD, ascending aorta enlargement 19m, right superficial hip infection scheduled for above procedure 07/08/2021 with Dr. MParalee Cancel   Pt seen by cardiology 06/26/2021. Stable at this visit.  Per note ascending aorta enlargement stable on CT 5/23. CT to be repeated in 2 years.   Per preop evaluation 03/05/2021, "Chart reviewed as part of pre-operative protocol coverage. Patient was contacted 03/05/2021 in reference to pre-operative risk assessment for pending surgery as outlined below.  CAstrid Draftswas last seen on 06/2020 by Dr. AMargaretann Loveless Primarily followed for HTN and aortic dilatation. Cardiac cath 02/2019 showed no evidence of CAD. Last echo 06/2020 EF 55-60%, grade 1 DD, mild dilation of aortic root and ascending aorta. Rcri 0.4% indicating low CV risk. I reached out to patient for update on how he is doing. The patient affirms he has been doing well without any new cardiac symptoms. Able to achieve over 4 METS without angina or dyspnea per our discussion. Therefore, based on ACC/AHA guidelines, the patient would be at acceptable risk for the planned procedure without further cardiovascular testing. The patient was advised that if he develops new symptoms prior to surgery to contact our office to arrange for a follow-up visit, and he verbalized understanding."  Anticipate pt can proceed with planned procedure barring acute status change.   VS: BP 120/80   Pulse 69   Temp 36.7 C (Oral)   Resp 18   Ht '6\' 3"'$  (1.905 m)   Wt 114.3 kg   SpO2 96%   BMI 31.50 kg/m   PROVIDERS: HJeanie Sewer NP is PCP   Cardiologist:   GElouise Munroe MD  LABS: Labs reviewed: Acceptable for surgery. (all labs ordered are listed, but only abnormal results are displayed)  Labs Reviewed  CBC - Abnormal; Notable for the following components:      Result Value   RBC 4.03 (*)    Hemoglobin 11.6 (*)    HCT 36.0 (*)    All other components within normal limits  SURGICAL PCR SCREEN  COMPREHENSIVE METABOLIC PANEL  TYPE AND SCREEN     IMAGES: CT Angio Chest 06/17/2021 IMPRESSION: 1. 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 Recommend annual imaging followup by CTA or MRA. This recommendation follows 2010 ACCF/AHA/AATS/ACR/ASA/SCA/SCAI/SIR/STS/SVM Guidelines for the Diagnosis and Management of Patients with Thoracic Aortic Disease. Circulation. 2010; 121:: O878-M767 Aortic aneurysm NOS (ICD10-I71.9) 2. Moderate (approximate 50%) compression deformity involving the superior endplate of the T8 vertebral body, age indeterminate though without associated fracture line or paraspinal hematoma and thus favored to be subacute/chronic in etiology. Correlation point tenderness at this location is advised. 3. Suspected hepatic steatosis with nodularity of the hepatic contour as could be seen in the setting of early cirrhotic change. Correlation with LFTs is advised.  EKG: 04/23/2021 Rate 67 bpm  Sinus rhythm  Borderline intraventricular conduction delay Abnormal R-wave progression, early transition   CV: Echo 07/12/2020  1. Left ventricular ejection fraction, by estimation, is 55 to 60%. The  left ventricle has normal function. The left  ventricle has no regional  wall motion abnormalities. The left ventricular internal cavity size was  mildly dilated. Left ventricular  diastolic parameters are consistent with Grade I diastolic dysfunction  (impaired relaxation).   2. Right ventricular systolic function is  normal. The right ventricular  size is normal.   3. The mitral valve is normal in structure. No evidence of mitral valve  regurgitation. No evidence of mitral stenosis.   4. The aortic valve is tricuspid. Aortic valve regurgitation is not  visualized. Mild aortic valve sclerosis is present, with no evidence of  aortic valve stenosis.   5. Aortic dilatation noted. There is mild dilatation of the aortic root,  measuring 40 mm. There is mild dilatation of the ascending aorta,  measuring 42 mm.  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   40 mm by echo   Chest pain of uncertain etiology    Normal coronaries after abnormal Myoview Feb 2021 Echo March 2021 showed normal LVF- mild LVH    Chest pain of unknown etiology 02/2019   normal coronaries after abnormal Myoview   COPD (chronic obstructive pulmonary disease) ([place])    Dyspnea    Encounter for screening for COVID-19 07/01/2021   Generalized enlarged lymph nodes 07/01/2021   Hypertension    normal RA dopplers   Hyponatremia    Other allergy status, other than to drugs and biological substances 07/01/2021   Other and unspecified complications of medical care, not elsewhere classified 07/01/2021   Other ill-defined and unknown causes of morbidity and mortality 07/01/2021   Overweight    Pain in left knee 07/01/2021   Pain in right foot 07/01/2021    Past Surgical History:  Procedure Laterality Date   HIP ARTHROPLASTY Left 2021   HIP ARTHROPLASTY Right 2023   KNEE ARTHROSCOPY Left 2008   LEFT HEART CATH AND CORONARY ANGIOGRAPHY N/A 03/21/2019   Procedure: LEFT HEART CATH AND CORONARY ANGIOGRAPHY;  Surgeon: Burnell Blanks, MD;  Location: [facility];  Service: Cardiovascular;  Laterality: N/A;   LUMBAR LAMINECTOMY  2000   TENDON RELEASE Right 2018   foot   WISDOM TOOTH EXTRACTION     age 53    MEDICATIONS:  albuterol (VENTOLIN HFA) 108 (90 Base)  MCG/ACT inhaler   augmented betamethasone dipropionate (DIPROLENE-AF) 0.05 % cream   Carboxymethylcellulose Sod PF 0.5 % SOLN   carvedilol (COREG) 25 MG tablet   cefadroxil (DURICEF) 500 MG capsule   diclofenac Sodium (VOLTAREN) 1 % GEL   DULoxetine (CYMBALTA) 60 MG capsule   fluticasone (FLONASE) 50 MCG/ACT nasal spray   HYDROcodone-acetaminophen (NORCO) 10-325 MG tablet   Ipratropium-Albuterol (COMBIVENT) 20-100 MCG/ACT AERS respimat   methocarbamol (ROBAXIN) 500 MG tablet   Multiple Vitamins-Minerals (MENS MULTIVITAMIN PLUS PO)   pantoprazole (PROTONIX) 40 MG tablet   pregabalin (LYRICA) 150 MG capsule   rosuvastatin (CRESTOR) 20 MG tablet   No current facility-administered medications for this encounter.    Konrad Felix Ward, PA-C [facility] Pre-Surgical Testing [phone]

## 2021-07-07 NOTE — Progress Notes (Addendum)
Anesthesia note:  Bowel prep reminder:NA  PCP - Jeanie Sewer NP Cardiologist -Dr. Kelvin Cellar Other-   Chest x-ray - CT chest Aorta 06/18/21-epic EKG - 04/23/21-epic Stress Test - 2021 ECHO - 06/26/20-epic Cardiac Cath - 03/21/19  Pacemaker/ICD device last checked:NA  Sleep Study - no CPAP -   Pt is pre diabetic-NA Fasting Blood Sugar -  Checks Blood Sugar _____  Blood Thinner:NA Blood Thinner Instructions: Aspirin Instructions: Last Dose:  Anesthesia review: yes  Patient denies shortness of breath, fever, cough and chest pain at PAT appointment Pt has COPD and uses his inhalers 2-3 times a day. He needs a cane at the moment and has gained weight. His hip from march is now infected.  Patient verbalized understanding of instructions that were given to them at the PAT appointment. Patient was also instructed that they will need to review over the PAT instructions again at home before surgery. Yes his wife was with him.

## 2021-07-08 ENCOUNTER — Inpatient Hospital Stay (HOSPITAL_COMMUNITY)
Admission: AD | Admit: 2021-07-08 | Discharge: 2021-07-14 | DRG: 501 | Disposition: A | Discharge status: Home Health | Payer: Managed Care, Other (non HMO) | Attending: Orthopedic Surgery | Admitting: Orthopedic Surgery

## 2021-07-08 ENCOUNTER — Ambulatory Visit (HOSPITAL_COMMUNITY): Payer: Managed Care, Other (non HMO)

## 2021-07-08 ENCOUNTER — Encounter (HOSPITAL_COMMUNITY)
Admission: AD | Disposition: A | Discharge status: Home / Self Care | Payer: Self-pay | Source: Home / Self Care | Attending: Orthopedic Surgery

## 2021-07-08 ENCOUNTER — Ambulatory Visit (HOSPITAL_COMMUNITY): Payer: Managed Care, Other (non HMO) | Admitting: Physician Assistant

## 2021-07-08 ENCOUNTER — Encounter (HOSPITAL_COMMUNITY): Payer: Self-pay | Admitting: Orthopedic Surgery

## 2021-07-08 ENCOUNTER — Other Ambulatory Visit: Payer: Self-pay

## 2021-07-08 ENCOUNTER — Ambulatory Visit (HOSPITAL_COMMUNITY): Payer: Managed Care, Other (non HMO) | Admitting: Anesthesiology

## 2021-07-08 DIAGNOSIS — Z6831 Body mass index (BMI) 31.0-31.9, adult: Secondary | ICD-10-CM

## 2021-07-08 DIAGNOSIS — E785 Hyperlipidemia, unspecified: Secondary | ICD-10-CM | POA: Diagnosis present

## 2021-07-08 DIAGNOSIS — E559 Vitamin D deficiency, unspecified: Secondary | ICD-10-CM | POA: Diagnosis present

## 2021-07-08 DIAGNOSIS — Z801 Family history of malignant neoplasm of trachea, bronchus and lung: Secondary | ICD-10-CM

## 2021-07-08 DIAGNOSIS — T8451XD Infection and inflammatory reaction due to internal right hip prosthesis, subsequent encounter: Secondary | ICD-10-CM

## 2021-07-08 DIAGNOSIS — F17220 Nicotine dependence, chewing tobacco, uncomplicated: Secondary | ICD-10-CM | POA: Diagnosis present

## 2021-07-08 DIAGNOSIS — Y831 Surgical operation with implant of artificial internal device as the cause of abnormal reaction of the patient, or of later complication, without mention of misadventure at the time of the procedure: Secondary | ICD-10-CM | POA: Diagnosis present

## 2021-07-08 DIAGNOSIS — T8451XA Infection and inflammatory reaction due to internal right hip prosthesis, initial encounter: Secondary | ICD-10-CM | POA: Diagnosis present

## 2021-07-08 DIAGNOSIS — F1010 Alcohol abuse, uncomplicated: Secondary | ICD-10-CM

## 2021-07-08 DIAGNOSIS — Z79899 Other long term (current) drug therapy: Secondary | ICD-10-CM | POA: Diagnosis not present

## 2021-07-08 DIAGNOSIS — Z7951 Long term (current) use of inhaled steroids: Secondary | ICD-10-CM

## 2021-07-08 DIAGNOSIS — E663 Overweight: Secondary | ICD-10-CM | POA: Diagnosis present

## 2021-07-08 DIAGNOSIS — D649 Anemia, unspecified: Secondary | ICD-10-CM | POA: Diagnosis not present

## 2021-07-08 DIAGNOSIS — F6081 Narcissistic personality disorder: Secondary | ICD-10-CM | POA: Diagnosis present

## 2021-07-08 DIAGNOSIS — Z20822 Contact with and (suspected) exposure to covid-19: Secondary | ICD-10-CM | POA: Diagnosis present

## 2021-07-08 DIAGNOSIS — F4312 Post-traumatic stress disorder, chronic: Secondary | ICD-10-CM | POA: Diagnosis present

## 2021-07-08 DIAGNOSIS — K219 Gastro-esophageal reflux disease without esophagitis: Secondary | ICD-10-CM | POA: Diagnosis present

## 2021-07-08 DIAGNOSIS — Z96642 Presence of left artificial hip joint: Secondary | ICD-10-CM | POA: Diagnosis present

## 2021-07-08 DIAGNOSIS — I10 Essential (primary) hypertension: Secondary | ICD-10-CM | POA: Diagnosis present

## 2021-07-08 DIAGNOSIS — J449 Chronic obstructive pulmonary disease, unspecified: Secondary | ICD-10-CM | POA: Diagnosis present

## 2021-07-08 DIAGNOSIS — Z8042 Family history of malignant neoplasm of prostate: Secondary | ICD-10-CM

## 2021-07-08 DIAGNOSIS — Z01818 Encounter for other preprocedural examination: Secondary | ICD-10-CM

## 2021-07-08 DIAGNOSIS — D62 Acute posthemorrhagic anemia: Secondary | ICD-10-CM | POA: Diagnosis not present

## 2021-07-08 DIAGNOSIS — Z96641 Presence of right artificial hip joint: Principal | ICD-10-CM

## 2021-07-08 HISTORY — PX: INCISION AND DRAINAGE HIP: SHX1801

## 2021-07-08 LAB — ABO/RH: ABO/RH(D): A POS

## 2021-07-08 SURGERY — IRRIGATION AND DEBRIDEMENT HIP
Anesthesia: Spinal | Site: Hip | Laterality: Right

## 2021-07-08 MED ORDER — FERROUS SULFATE 325 (65 FE) MG PO TABS
325.0000 mg | ORAL_TABLET | Freq: Three times a day (TID) | ORAL | Status: DC
  Administered 2021-07-09 – 2021-07-14 (×17): 325 mg via ORAL
  Filled 2021-07-08 (×17): qty 1

## 2021-07-08 MED ORDER — ACETAMINOPHEN 500 MG PO TABS
1000.0000 mg | ORAL_TABLET | Freq: Four times a day (QID) | ORAL | Status: AC
  Administered 2021-07-08 – 2021-07-09 (×4): 1000 mg via ORAL
  Filled 2021-07-08 (×4): qty 2

## 2021-07-08 MED ORDER — LACTATED RINGERS IV SOLN: INTRAVENOUS | Status: DC

## 2021-07-08 MED ORDER — DEXAMETHASONE SODIUM PHOSPHATE 10 MG/ML IJ SOLN
8.0000 mg | Freq: Once | INTRAMUSCULAR | Status: AC
  Administered 2021-07-08: 8 mg via INTRAVENOUS

## 2021-07-08 MED ORDER — IPRATROPIUM-ALBUTEROL 0.5-2.5 (3) MG/3ML IN SOLN
3.0000 mL | Freq: Two times a day (BID) | RESPIRATORY_TRACT | Status: DC
Start: 2021-07-08 — End: 2021-07-14
  Administered 2021-07-08 – 2021-07-12 (×9): 3 mL via RESPIRATORY_TRACT
  Filled 2021-07-08 (×11): qty 3

## 2021-07-08 MED ORDER — POLYETHYLENE GLYCOL 3350 17 G PO PACK
17.0000 g | PACK | Freq: Every day | ORAL | Status: DC | PRN
  Administered 2021-07-10 – 2021-07-14 (×2): 17 g via ORAL
  Filled 2021-07-08 (×2): qty 1

## 2021-07-08 MED ORDER — FENTANYL CITRATE (PF) 100 MCG/2ML IJ SOLN
INTRAMUSCULAR | Status: DC | PRN
  Administered 2021-07-08 (×2): 50 ug via INTRAVENOUS

## 2021-07-08 MED ORDER — MIDAZOLAM HCL 2 MG/2ML IJ SOLN
INTRAMUSCULAR | Status: AC
Start: 2021-07-08 — End: ?
  Filled 2021-07-08: qty 2

## 2021-07-08 MED ORDER — POLYVINYL ALCOHOL 1.4 % OP SOLN
1.0000 [drp] | Freq: Four times a day (QID) | OPHTHALMIC | Status: DC | PRN
Start: 2021-07-08 — End: 2021-07-14

## 2021-07-08 MED ORDER — DULOXETINE HCL 60 MG PO CPEP
60.0000 mg | ORAL_CAPSULE | Freq: Every day | ORAL | Status: DC
  Administered 2021-07-09 – 2021-07-14 (×6): 60 mg via ORAL
  Filled 2021-07-08 (×6): qty 1

## 2021-07-08 MED ORDER — BUPIVACAINE IN DEXTROSE 0.75-8.25 % IT SOLN
INTRATHECAL | Status: DC | PRN
  Administered 2021-07-08: 2 mL via INTRATHECAL

## 2021-07-08 MED ORDER — PROPOFOL 1000 MG/100ML IV EMUL
INTRAVENOUS | Status: AC
Start: 2021-07-08 — End: ?
  Filled 2021-07-08: qty 100

## 2021-07-08 MED ORDER — METOCLOPRAMIDE HCL 5 MG/ML IJ SOLN: 5.0000 mg | Freq: Three times a day (TID) | INTRAMUSCULAR | Status: DC | PRN

## 2021-07-08 MED ORDER — VANCOMYCIN HCL 1250 MG/250ML IV SOLN
1250.0000 mg | Freq: Two times a day (BID) | INTRAVENOUS | Status: DC
  Filled 2021-07-08: qty 250

## 2021-07-08 MED ORDER — PREGABALIN 75 MG PO CAPS
150.0000 mg | ORAL_CAPSULE | Freq: Two times a day (BID) | ORAL | Status: DC
  Administered 2021-07-08 – 2021-07-14 (×12): 150 mg via ORAL
  Filled 2021-07-08 (×12): qty 2

## 2021-07-08 MED ORDER — FENTANYL CITRATE PF 50 MCG/ML IJ SOSY
PREFILLED_SYRINGE | INTRAMUSCULAR | Status: AC
  Administered 2021-07-08: 50 ug via INTRAVENOUS
  Filled 2021-07-08: qty 2

## 2021-07-08 MED ORDER — ROSUVASTATIN CALCIUM 20 MG PO TABS
20.0000 mg | ORAL_TABLET | Freq: Every day | ORAL | Status: DC
  Administered 2021-07-09 – 2021-07-14 (×6): 20 mg via ORAL
  Filled 2021-07-08 (×6): qty 1

## 2021-07-08 MED ORDER — ACETAMINOPHEN 325 MG PO TABS
325.0000 mg | ORAL_TABLET | Freq: Four times a day (QID) | ORAL | Status: DC | PRN
  Administered 2021-07-14 (×2): 650 mg via ORAL
  Filled 2021-07-08 (×2): qty 2

## 2021-07-08 MED ORDER — FENTANYL CITRATE PF 50 MCG/ML IJ SOSY
PREFILLED_SYRINGE | INTRAMUSCULAR | Status: AC
  Filled 2021-07-08: qty 1

## 2021-07-08 MED ORDER — EPHEDRINE SULFATE-NACL 50-0.9 MG/10ML-% IV SOSY
PREFILLED_SYRINGE | INTRAVENOUS | Status: DC | PRN
  Administered 2021-07-08 (×3): 5 mg via INTRAVENOUS
  Administered 2021-07-08: 10 mg via INTRAVENOUS

## 2021-07-08 MED ORDER — FENTANYL CITRATE PF 50 MCG/ML IJ SOSY
50.0000 ug | PREFILLED_SYRINGE | INTRAMUSCULAR | Status: DC
  Administered 2021-07-08: 50 ug via INTRAVENOUS

## 2021-07-08 MED ORDER — MENTHOL 3 MG MT LOZG: 1.0000 | LOZENGE | OROMUCOSAL | Status: DC | PRN

## 2021-07-08 MED ORDER — ALBUMIN HUMAN 5 % IV SOLN: INTRAVENOUS | Status: DC | PRN

## 2021-07-08 MED ORDER — DOCUSATE SODIUM 100 MG PO CAPS
100.0000 mg | ORAL_CAPSULE | Freq: Two times a day (BID) | ORAL | Status: DC
  Administered 2021-07-08 – 2021-07-14 (×12): 100 mg via ORAL
  Filled 2021-07-08 (×12): qty 1

## 2021-07-08 MED ORDER — ASPIRIN 81 MG PO CHEW
81.0000 mg | CHEWABLE_TABLET | Freq: Two times a day (BID) | ORAL | Status: DC
  Administered 2021-07-08 – 2021-07-14 (×12): 81 mg via ORAL
  Filled 2021-07-08 (×12): qty 1

## 2021-07-08 MED ORDER — OXYCODONE HCL 5 MG PO TABS
ORAL_TABLET | ORAL | Status: AC
  Filled 2021-07-08: qty 2

## 2021-07-08 MED ORDER — HYDROMORPHONE HCL 1 MG/ML IJ SOLN
0.5000 mg | INTRAMUSCULAR | Status: DC | PRN
  Administered 2021-07-08: 1 mg via INTRAVENOUS
  Administered 2021-07-08: 0.5 mg via INTRAVENOUS
  Administered 2021-07-09 – 2021-07-14 (×20): 1 mg via INTRAVENOUS
  Filled 2021-07-08 (×22): qty 1

## 2021-07-08 MED ORDER — STERILE WATER FOR IRRIGATION IR SOLN
Status: DC | PRN
  Administered 2021-07-08: 1000 mL

## 2021-07-08 MED ORDER — MIDAZOLAM HCL 2 MG/2ML IJ SOLN
INTRAMUSCULAR | Status: DC | PRN
  Administered 2021-07-08: 2 mg via INTRAVENOUS

## 2021-07-08 MED ORDER — PROPOFOL 10 MG/ML IV BOLUS
INTRAVENOUS | Status: DC | PRN
  Administered 2021-07-08 (×2): 20 mg via INTRAVENOUS

## 2021-07-08 MED ORDER — CARVEDILOL 25 MG PO TABS
25.0000 mg | ORAL_TABLET | Freq: Two times a day (BID) | ORAL | Status: DC
  Administered 2021-07-08 – 2021-07-14 (×12): 25 mg via ORAL
  Filled 2021-07-08 (×12): qty 1

## 2021-07-08 MED ORDER — ONDANSETRON HCL 4 MG/2ML IJ SOLN: 4.0000 mg | Freq: Once | INTRAMUSCULAR | Status: DC | PRN

## 2021-07-08 MED ORDER — PANTOPRAZOLE SODIUM 40 MG PO TBEC
40.0000 mg | DELAYED_RELEASE_TABLET | Freq: Every day | ORAL | Status: DC
  Administered 2021-07-09 – 2021-07-14 (×6): 40 mg via ORAL
  Filled 2021-07-08 (×6): qty 1

## 2021-07-08 MED ORDER — OXYCODONE HCL 5 MG PO TABS
5.0000 mg | ORAL_TABLET | ORAL | Status: DC | PRN
  Administered 2021-07-08 – 2021-07-09 (×4): 10 mg via ORAL
  Filled 2021-07-08: qty 2
  Filled 2021-07-08: qty 1
  Filled 2021-07-08: qty 2

## 2021-07-08 MED ORDER — METOCLOPRAMIDE HCL 5 MG PO TABS: 5.0000 mg | ORAL_TABLET | Freq: Three times a day (TID) | ORAL | Status: DC | PRN

## 2021-07-08 MED ORDER — FENTANYL CITRATE (PF) 100 MCG/2ML IJ SOLN
INTRAMUSCULAR | Status: AC
  Filled 2021-07-08: qty 2

## 2021-07-08 MED ORDER — LIDOCAINE 2% (20 MG/ML) 5 ML SYRINGE
INTRAMUSCULAR | Status: DC | PRN
  Administered 2021-07-08: 40 mg via INTRAVENOUS

## 2021-07-08 MED ORDER — 0.9 % SODIUM CHLORIDE (POUR BTL) OPTIME
TOPICAL | Status: DC | PRN
  Administered 2021-07-08: 1000 mL

## 2021-07-08 MED ORDER — TRANEXAMIC ACID-NACL 1000-0.7 MG/100ML-% IV SOLN
1000.0000 mg | Freq: Once | INTRAVENOUS | Status: AC
  Administered 2021-07-08: 1000 mg via INTRAVENOUS

## 2021-07-08 MED ORDER — OXYCODONE HCL 5 MG PO TABS
10.0000 mg | ORAL_TABLET | ORAL | Status: DC | PRN
  Administered 2021-07-09: 15 mg via ORAL
  Administered 2021-07-09: 10 mg via ORAL
  Administered 2021-07-10 – 2021-07-14 (×20): 15 mg via ORAL
  Filled 2021-07-08 (×18): qty 3
  Filled 2021-07-08: qty 2
  Filled 2021-07-08 (×5): qty 3

## 2021-07-08 MED ORDER — METHOCARBAMOL 500 MG IVPB - SIMPLE MED
INTRAVENOUS | Status: AC
  Filled 2021-07-08: qty 50

## 2021-07-08 MED ORDER — METHOCARBAMOL 500 MG PO TABS
500.0000 mg | ORAL_TABLET | Freq: Four times a day (QID) | ORAL | Status: DC | PRN
  Administered 2021-07-09 – 2021-07-14 (×11): 500 mg via ORAL
  Filled 2021-07-08 (×11): qty 1

## 2021-07-08 MED ORDER — ONDANSETRON HCL 4 MG/2ML IJ SOLN
INTRAMUSCULAR | Status: DC | PRN
  Administered 2021-07-08: 4 mg via INTRAVENOUS

## 2021-07-08 MED ORDER — PHENYLEPHRINE HCL-NACL 20-0.9 MG/250ML-% IV SOLN
INTRAVENOUS | Status: DC | PRN
  Administered 2021-07-08: 20 ug/min via INTRAVENOUS

## 2021-07-08 MED ORDER — BISACODYL 10 MG RE SUPP
10.0000 mg | Freq: Every day | RECTAL | Status: DC | PRN
  Filled 2021-07-08: qty 1

## 2021-07-08 MED ORDER — CEFAZOLIN SODIUM-DEXTROSE 2-4 GM/100ML-% IV SOLN
2.0000 g | INTRAVENOUS | Status: AC
  Administered 2021-07-08: 2 g via INTRAVENOUS
  Filled 2021-07-08: qty 100

## 2021-07-08 MED ORDER — CHLORHEXIDINE GLUCONATE 0.12 % MT SOLN
15.0000 mL | Freq: Once | OROMUCOSAL | Status: AC
  Administered 2021-07-08: 15 mL via OROMUCOSAL

## 2021-07-08 MED ORDER — TRANEXAMIC ACID-NACL 1000-0.7 MG/100ML-% IV SOLN
INTRAVENOUS | Status: AC
  Filled 2021-07-08: qty 100

## 2021-07-08 MED ORDER — DIPHENHYDRAMINE HCL 12.5 MG/5ML PO ELIX
12.5000 mg | ORAL_SOLUTION | ORAL | Status: DC | PRN
  Administered 2021-07-12: 25 mg via ORAL
  Filled 2021-07-08: qty 10

## 2021-07-08 MED ORDER — ONDANSETRON HCL 4 MG PO TABS: 4.0000 mg | ORAL_TABLET | Freq: Four times a day (QID) | ORAL | Status: DC | PRN

## 2021-07-08 MED ORDER — VANCOMYCIN HCL 1000 MG IV SOLR
INTRAVENOUS | Status: DC | PRN
  Administered 2021-07-08: 1000 mg via TOPICAL

## 2021-07-08 MED ORDER — VANCOMYCIN HCL 2000 MG/400ML IV SOLN
2000.0000 mg | Freq: Once | INTRAVENOUS | Status: AC
Start: 2021-07-08 — End: 2021-07-09
  Administered 2021-07-08: 2000 mg via INTRAVENOUS
  Filled 2021-07-08: qty 400

## 2021-07-08 MED ORDER — PROPOFOL 500 MG/50ML IV EMUL
INTRAVENOUS | Status: DC | PRN
  Administered 2021-07-08: 60 ug/kg/min via INTRAVENOUS

## 2021-07-08 MED ORDER — VANCOMYCIN HCL 1000 MG IV SOLR
INTRAVENOUS | Status: AC
Start: 2021-07-08 — End: ?
  Filled 2021-07-08: qty 20

## 2021-07-08 MED ORDER — SODIUM CHLORIDE 0.9 % IR SOLN
Status: DC | PRN
  Administered 2021-07-08: 3000 mL

## 2021-07-08 MED ORDER — FENTANYL CITRATE PF 50 MCG/ML IJ SOSY
25.0000 ug | PREFILLED_SYRINGE | INTRAMUSCULAR | Status: DC | PRN
  Administered 2021-07-08 (×2): 50 ug via INTRAVENOUS

## 2021-07-08 MED ORDER — LIDOCAINE HCL (PF) 2 % IJ SOLN
INTRAMUSCULAR | Status: AC
Start: 2021-07-08 — End: ?
  Filled 2021-07-08: qty 5

## 2021-07-08 MED ORDER — CEFAZOLIN SODIUM-DEXTROSE 2-4 GM/100ML-% IV SOLN
2.0000 g | Freq: Three times a day (TID) | INTRAVENOUS | Status: DC
  Administered 2021-07-09 (×2): 2 g via INTRAVENOUS
  Filled 2021-07-08 (×2): qty 100

## 2021-07-08 MED ORDER — PHENOL 1.4 % MT LIQD: 1.0000 | OROMUCOSAL | Status: DC | PRN

## 2021-07-08 MED ORDER — SODIUM CHLORIDE 0.9 % IV SOLN: INTRAVENOUS | Status: DC

## 2021-07-08 MED ORDER — EPHEDRINE 5 MG/ML INJ
INTRAVENOUS | Status: AC
Start: 2021-07-08 — End: ?
  Filled 2021-07-08: qty 5

## 2021-07-08 MED ORDER — ONDANSETRON HCL 4 MG/2ML IJ SOLN
4.0000 mg | Freq: Four times a day (QID) | INTRAMUSCULAR | Status: DC | PRN
  Administered 2021-07-14: 4 mg via INTRAVENOUS
  Filled 2021-07-08 (×2): qty 2

## 2021-07-08 MED ORDER — DEXAMETHASONE SODIUM PHOSPHATE 10 MG/ML IJ SOLN
10.0000 mg | Freq: Once | INTRAMUSCULAR | Status: AC
  Administered 2021-07-09: 10 mg via INTRAVENOUS
  Filled 2021-07-08: qty 1

## 2021-07-08 MED ORDER — ACETAMINOPHEN 500 MG PO TABS
1000.0000 mg | ORAL_TABLET | Freq: Once | ORAL | Status: AC
  Administered 2021-07-08: 1000 mg via ORAL
  Filled 2021-07-08: qty 2

## 2021-07-08 MED ORDER — METHOCARBAMOL 500 MG IVPB - SIMPLE MED
500.0000 mg | Freq: Four times a day (QID) | INTRAVENOUS | Status: DC | PRN
  Administered 2021-07-08: 500 mg via INTRAVENOUS

## 2021-07-08 MED ORDER — ALBUTEROL SULFATE (2.5 MG/3ML) 0.083% IN NEBU
2.5000 mg | INHALATION_SOLUTION | Freq: Four times a day (QID) | RESPIRATORY_TRACT | Status: DC | PRN
Start: 2021-07-08 — End: 2021-07-14

## 2021-07-08 MED ORDER — TRANEXAMIC ACID-NACL 1000-0.7 MG/100ML-% IV SOLN
1000.0000 mg | INTRAVENOUS | Status: AC
  Administered 2021-07-08: 1000 mg via INTRAVENOUS
  Filled 2021-07-08: qty 100

## 2021-07-08 MED ORDER — POVIDONE-IODINE 10 % EX SWAB
2.0000 "application " | Freq: Once | CUTANEOUS | Status: AC
  Administered 2021-07-08: 2 via TOPICAL

## 2021-07-08 MED ORDER — FLUTICASONE PROPIONATE 50 MCG/ACT NA SUSP
2.0000 | Freq: Every day | NASAL | Status: DC
  Administered 2021-07-10 – 2021-07-14 (×5): 2 via NASAL
  Filled 2021-07-08: qty 16

## 2021-07-08 MED ORDER — ONDANSETRON HCL 4 MG/2ML IJ SOLN
INTRAMUSCULAR | Status: AC
Start: 2021-07-08 — End: ?
  Filled 2021-07-08: qty 2

## 2021-07-08 MED ORDER — PRONTOSAN WOUND IRRIGATION OPTIME
TOPICAL | Status: DC | PRN
  Administered 2021-07-08: 1 via TOPICAL

## 2021-07-08 MED ORDER — HYDROMORPHONE HCL 1 MG/ML IJ SOLN
INTRAMUSCULAR | Status: AC
  Filled 2021-07-08: qty 1

## 2021-07-08 MED ORDER — DEXAMETHASONE SODIUM PHOSPHATE 10 MG/ML IJ SOLN
INTRAMUSCULAR | Status: AC
  Filled 2021-07-08: qty 1

## 2021-07-08 MED ORDER — ORAL CARE MOUTH RINSE: 15.0000 mL | Freq: Once | OROMUCOSAL | Status: AC

## 2021-07-08 SURGICAL SUPPLY — 49 items
BAG COUNTER SPONGE SURGICOUNT (BAG) ×1 IMPLANT
BAG ZIPLOCK 12X15 (MISCELLANEOUS) ×2 IMPLANT
BLADE SAW SGTL 11.0X1.19X90.0M (BLADE) IMPLANT
COVER SURGICAL LIGHT HANDLE (MISCELLANEOUS) ×2 IMPLANT
DERMABOND ADVANCED (GAUZE/BANDAGES/DRESSINGS) ×1
DERMABOND ADVANCED .7 DNX12 (GAUZE/BANDAGES/DRESSINGS) ×1 IMPLANT
DRAPE ORTHO SPLIT 77X108 STRL (DRAPES)
DRAPE STERI IOBAN 125X83 (DRAPES) ×1 IMPLANT
DRAPE SURG 17X11 SM STRL (DRAPES) ×2 IMPLANT
DRAPE SURG ORHT 6 SPLT 77X108 (DRAPES) ×2 IMPLANT
DRAPE U-SHAPE 47X51 STRL (DRAPES) ×2 IMPLANT
DRESSING MEPILEX FLEX 4X4 (GAUZE/BANDAGES/DRESSINGS) IMPLANT
DRSG AQUACEL AG ADV 3.5X10 (GAUZE/BANDAGES/DRESSINGS) ×2 IMPLANT
DRSG MEPILEX BORDER 4X12 (GAUZE/BANDAGES/DRESSINGS) ×1 IMPLANT
DRSG MEPILEX FLEX 4X4 (GAUZE/BANDAGES/DRESSINGS) ×2
DURAPREP 26ML APPLICATOR (WOUND CARE) ×2 IMPLANT
ELECT REM PT RETURN 15FT ADLT (MISCELLANEOUS) ×2 IMPLANT
EVACUATOR 1/8 PVC DRAIN (DRAIN) ×1 IMPLANT
GAUZE SPONGE 2X2 8PLY STRL LF (GAUZE/BANDAGES/DRESSINGS) ×1 IMPLANT
GAUZE XEROFORM 1X8 LF (GAUZE/BANDAGES/DRESSINGS) ×1 IMPLANT
GLOVE BIOGEL M 7.0 STRL (GLOVE) IMPLANT
GLOVE BIOGEL PI IND STRL 7.5 (GLOVE) ×1 IMPLANT
GLOVE BIOGEL PI IND STRL 8.5 (GLOVE) ×1 IMPLANT
GLOVE BIOGEL PI INDICATOR 7.5 (GLOVE) ×1
GLOVE BIOGEL PI INDICATOR 8.5 (GLOVE) ×1
GLOVE ECLIPSE 8.0 STRL XLNG CF (GLOVE) IMPLANT
GLOVE INDICATOR 6.5 STRL GRN (GLOVE) ×2 IMPLANT
GLOVE SURG ORTHO 8.0 STRL STRW (GLOVE) ×2 IMPLANT
GOWN STRL REUS W/ TWL LRG LVL3 (GOWN DISPOSABLE) ×2 IMPLANT
GOWN STRL REUS W/TWL LRG LVL3 (GOWN DISPOSABLE) ×2
HANDPIECE INTERPULSE COAX TIP (DISPOSABLE) ×1
IV NS IRRIG 3000ML ARTHROMATIC (IV SOLUTION) ×2 IMPLANT
KIT BASIN OR (CUSTOM PROCEDURE TRAY) ×1 IMPLANT
KIT TURNOVER KIT A (KITS) IMPLANT
MANIFOLD NEPTUNE II (INSTRUMENTS) ×2 IMPLANT
PACK ANTERIOR HIP CUSTOM (KITS) ×1 IMPLANT
PACK TOTAL JOINT (CUSTOM PROCEDURE TRAY) ×1 IMPLANT
PROTECTOR NERVE ULNAR (MISCELLANEOUS) ×1 IMPLANT
SET HNDPC FAN SPRY TIP SCT (DISPOSABLE) ×1 IMPLANT
SOLUTION IRRIG SURGIPHOR (IV SOLUTION) IMPLANT
SPONGE GAUZE 2X2 STER 10/PKG (GAUZE/BANDAGES/DRESSINGS)
STAPLER VISISTAT 35W (STAPLE) ×2 IMPLANT
SUT MNCRL AB 4-0 PS2 18 (SUTURE) ×2 IMPLANT
SUT VIC AB 1 CT1 36 (SUTURE) ×4 IMPLANT
SUT VIC AB 2-0 CT1 27 (SUTURE) ×2
SUT VIC AB 2-0 CT1 TAPERPNT 27 (SUTURE) ×2 IMPLANT
SWAB COLLECTION DEVICE MRSA (MISCELLANEOUS) ×1 IMPLANT
SWAB CULTURE ESWAB REG 1ML (MISCELLANEOUS) ×1 IMPLANT
TOWEL OR 17X26 10 PK STRL BLUE (TOWEL DISPOSABLE) ×4 IMPLANT

## 2021-07-08 NOTE — Anesthesia Procedure Notes (Signed)
Procedure Name: MAC Date/Time: 07/08/2021 3:37 PM  Performed by: Eben Burow, CRNAPre-anesthesia Checklist: Patient identified, Emergency Drugs available, Suction available, Patient being monitored and Timeout performed Oxygen Delivery Method: Simple face mask Placement Confirmation: positive ETCO2

## 2021-07-08 NOTE — Anesthesia Procedure Notes (Signed)
Spinal  Patient location during procedure: OR Start time: 07/08/2021 3:36 PM End time: 07/08/2021 3:39 PM Reason for block: surgical anesthesia Staffing Performed: anesthesiologist  Anesthesiologist: [name], MD Performed by: [name], MD Authorized by: [name], MD   Preanesthetic Checklist Completed: patient identified, IV checked, risks and benefits discussed, surgical consent, monitors and equipment checked, pre-op evaluation and timeout performed Spinal Block Patient position: sitting Prep: DuraPrep and site prepped and draped Patient monitoring: continuous pulse ox and blood pressure Approach: midline Location: L3-4 Injection technique: single-shot Needle Needle type: Pencan  Needle gauge: 24 G Assessment Events: CSF return Additional Notes Functioning IV was confirmed and monitors were applied. Sterile prep and drape, including hand hygiene, mask and sterile gloves were used. The patient was positioned and the spine was prepped. The skin was anesthetized with lidocaine.  Free flow of clear CSF was obtained prior to injecting local anesthetic into the CSF.  The spinal needle aspirated freely following injection.  The needle was carefully withdrawn.  The patient tolerated the procedure well. Consent was obtained prior to procedure with all questions answered and concerns addressed. Risks including but not limited to bleeding, infection, nerve damage, paralysis, failed block, inadequate analgesia, allergic reaction, high spinal, itching and headache were discussed and the patient wished to proceed.   Hoy Morn, MD

## 2021-07-08 NOTE — Progress Notes (Signed)
Pharmacy Antibiotic Note  Reginald Walsh is a 56 y.o. male admitted on 07/08/2021 with  wound infection, PJI right hip .  Pharmacy has been consulted for vancomycin dosing.  Plan: Vancomycin 2 g IV x 1 then 1250 mg IV every 12 hours (Goal AUC 400-550. Expected AUC: 537.4. SCr used: 1.05) Continue Ancef per Costella Hatcher, PA-C Monitor clinical progress, renal function, vancomycin levels as indicated F/U C&S, abx deescalation / LOT    Height: '6\' 3"'$  (190.5 cm) Weight: 114.3 kg (252 lb) IBW/kg (Calculated) : 84.5  Temp (24hrs), Avg:97.7 F (36.5 C), Min:97.3 F (36.3 C), Max:98.1 F (36.7 C)  Recent Labs  Lab 07/07/21 1443  WBC 6.1  CREATININE 1.05    Estimated Creatinine Clearance: 105.8 mL/min (by C-G formula based on SCr of 1.05 mg/dL).    Allergies  Allergen Reactions   Escitalopram Oxalate Anxiety    Antimicrobials this admission: 6/13 Ancef >>  6/13 vancomycin >>    Microbiology results: 6/12 surgical MRSA PCR: neg 6/13 right hip wound: pending   Thank you for allowing pharmacy to be a part of this patient's care.  Royetta Asal, PharmD, BCPS Clinical Pharmacist [place] Please utilize Amion for appropriate phone number to reach the unit pharmacist ([place]) 07/08/2021 7:59 PM

## 2021-07-08 NOTE — H&P (Signed)
I&D ADMISSION H&P  Patient is admitted for I&D of the right hip with possible head and liner exchange  Subjective:  Chief Complaint: Right superficial vs deep hip infection   HPI: Reginald Walsh, 56 y.o. male, has a history of primary right total hip arthroplasty on 04/14/21 by Dr. Alvan Dame. He did well in the early postoperative period, but subsequently developed peri-incisional erythema around 3-4 weeks post op which appeared consistent with a cellulitic change related to stitch reaction. He was placed on keflex with improvement, but with recurrent symptoms once regimen was completed. Dr. Alvan Dame discussed with him need for returning to the OR for open I&D and possible head/liner exchange.   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   Other and unspecified noninfectious gastroenteritis and colitis 07/01/2021   Other osteonecrosis, 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[place]) 07/01/2021   Presbyopia 07/01/2021   Thoracic or lumbosacral neuritis or radiculitis 07/01/2021   Tobacco use disorder 07/01/2021   Vitamin D deficiency 07/01/2021   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   History of total left hip arthroplasty 07/01/2021   Hyponatremia 04/21/2021   Hypomagnesemia 04/21/2021   Hypokalemia 04/21/2021   Chronic post-traumatic stress disorder 04/21/2021   Chronic obstructive pulmonary disease, unspecified ([place]) 04/21/2021   Gastroesophageal reflux disease 04/21/2021   Major depressive disorder, recurrent, moderate ([place]) 04/21/2021    Hyperlipidemia 04/21/2021   Cervical spondylosis 12/06/2019   Essential hypertension 06/02/2019   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[place]) 06/02/2019   Cervical radiculopathy 10/21/2016   Chronic back pain 04/21/2016   Past Medical History:  Diagnosis Date   Abnormal stress test    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[place]) 03/2019   40 mm by echo   Chest pain of uncertain etiology    Normal coronaries after abnormal Myoview Feb 2021 Echo March 2021 showed normal LVF- mild LVH    Chest pain of unknown etiology 02/2019   normal coronaries after abnormal Myoview   COPD (chronic obstructive pulmonary disease) ([place])    Dyspnea    Encounter for screening for COVID-19 07/01/2021   Generalized enlarged lymph nodes 07/01/2021   Hypertension    normal RA dopplers   Hyponatremia    Other allergy status, other than to drugs and biological substances 07/01/2021   Other and unspecified complications of medical care, not elsewhere classified 07/01/2021   Other ill-defined and unknown causes of morbidity and mortality 07/01/2021   Overweight    Pain in left knee 07/01/2021   Pain in right foot 07/01/2021    Past Surgical History:  Procedure Laterality Date   HIP ARTHROPLASTY Left 2021   HIP ARTHROPLASTY Right 2023   KNEE ARTHROSCOPY Left 2008   LEFT HEART CATH AND CORONARY ANGIOGRAPHY N/A 03/21/2019   Procedure: LEFT HEART CATH AND CORONARY ANGIOGRAPHY;  Surgeon: Burnell Blanks, MD;  Location: [facility];  Service: Cardiovascular;  Laterality: N/A;   LUMBAR LAMINECTOMY  2000   TENDON RELEASE Right 2018   foot   WISDOM TOOTH EXTRACTION     age 12    No  current facility-administered medications for this encounter.   Current Outpatient Medications  Medication Sig Dispense Refill Last Dose   albuterol (VENTOLIN HFA) 108 (90 Base) MCG/ACT inhaler Inhale 2 puffs into the lungs every 6 (six) hours as needed for wheezing or shortness of breath.      augmented betamethasone dipropionate  (DIPROLENE-AF) 0.05 % cream Apply 1 application topically 2 (two) times daily as needed (eczema).      Carboxymethylcellulose Sod PF 0.5 % SOLN Apply 1 drop to eye 4 (four) times daily as needed (dry eyes).      carvedilol (COREG) 25 MG tablet Take 1 tablet (25 mg total) by mouth 2 (two) times daily. 180 tablet 3    cefadroxil (DURICEF) 500 MG capsule Take 500 mg by mouth every 12 (twelve) hours.      diclofenac Sodium (VOLTAREN) 1 % GEL Apply 1 application topically daily as needed (pain).      DULoxetine (CYMBALTA) 60 MG capsule Take 60 mg by mouth daily.      fluticasone (FLONASE) 50 MCG/ACT nasal spray Place 2 sprays into both nostrils in the morning and at bedtime. 18.2 mL 5    HYDROcodone-acetaminophen (NORCO) 10-325 MG tablet Take 1 tablet by mouth every 8 (eight) hours as needed (chronic pain).      Ipratropium-Albuterol (COMBIVENT) 20-100 MCG/ACT AERS respimat Inhale 1 puff into the lungs in the morning and at bedtime.      methocarbamol (ROBAXIN) 500 MG tablet Take 500 mg by mouth every 6 (six) hours as needed for muscle spasms.      Multiple Vitamins-Minerals (MENS MULTIVITAMIN PLUS PO) Take 1 tablet by mouth daily.       pantoprazole (PROTONIX) 40 MG tablet Take 40 mg by mouth daily.      pregabalin (LYRICA) 150 MG capsule Take 150 mg by mouth 2 (two) times daily.       rosuvastatin (CRESTOR) 20 MG tablet Take 1 tablet (20 mg total) by mouth daily. 90 tablet 3    Allergies  Allergen Reactions   Escitalopram Oxalate Anxiety    Social History   Tobacco Use   Smoking status: Former    Packs/day: 0.50    Years: 7.00    Total pack years: 3.50    Types: Cigarettes    Quit date: 1989    Years since quitting: 34.4   Smokeless tobacco: Current    Types: Snuff, Chew  Substance Use Topics   Alcohol use: Yes    Alcohol/week: 1.0 standard drink of alcohol    Types: 1 Cans of beer per week    Comment: occasional    Family History  Adopted: Yes  Problem Relation Age of Onset    Lung cancer Father    Prostate cancer Father        Unsure of age of onset     Review of Systems  Constitutional:  Negative for chills and fever.  Respiratory:  Negative for cough and shortness of breath.   Cardiovascular:  Negative for chest pain.  Gastrointestinal:  Negative for nausea and vomiting.  Musculoskeletal:  Positive for arthralgias.     Objective:  Physical Exam Very pleasant 56 year old male awake alert and oriented. He is in no acute distress. Today he walks in using a cane. He states that when the pain got significant enough over the weekend he had to use a walker and has been able to transition to a cane. He is with his wife for evaluation  Right hip exam: His surgical  incision is healed without drainage however there is some fullness now on the inferior aspect the incision that was not there before. The significant bright red erythematous changes around his hip have resolved but are now more localized to the distal third of the incision. He has some tenderness in this area. No expressed fluid to palpation. No significant lower extremity erythema or edema   Vital signs in last 24 hours: Temp:  [98 F (36.7 C)] 98 F (36.7 C) (06/12 1403) Pulse Rate:  [69] 69 (06/12 1403) Resp:  [18] 18 (06/12 1403) BP: (120)/(80) 120/80 (06/12 1403) SpO2:  [96 %] 96 % (06/12 1403) Weight:  [114.3 kg] 114.3 kg (06/12 1403)  Labs:   Estimated body mass index is 31.5 kg/m as calculated from the following:   Height as of 07/07/21: '6\' 3"'$  (1.905 m).   Weight as of 07/07/21: 114.3 kg.   Imaging Review Plain radiographs demonstrate right total hip arthroplasty components in good positioning.       Assessment/Plan:  Superficial vs deep infection, right hip   The patient history, physical examination, clinical judgement of the provider and imaging studies are consistent with infection of the right hip(s) and I&D is deemed medically necessary. The treatment options including  medical management, injection therapy, arthroscopy and arthroplasty were discussed at length. The risks and benefits of total hip arthroplasty were presented and reviewed. The risks due to aseptic loosening, infection, stiffness, dislocation/subluxation,  thromboembolic complications and other imponderables were discussed.  The patient acknowledged the explanation, agreed to proceed with the plan and consent was signed. Patient is being admitted for inpatient treatment for surgery, pain control, PT, OT, prophylactic antibiotics, VTE prophylaxis, progressive ambulation and ADL's and discharge planning.The patient is planning to be discharged  home.   Costella Hatcher, PA-C Orthopedic Surgery [facility] [place] [phone]

## 2021-07-08 NOTE — Brief Op Note (Signed)
07/08/2021  5:48 PM  PATIENT:  [name]  56 y.o. male  PRE-OPERATIVE DIAGNOSIS:  right hip infection status post total hip arthroplasty  POST-OPERATIVE DIAGNOSIS: right hip infection status post total hip arthroplasty  PROCEDURE:  Procedure(s): EXCISIONAL AND NON EXCISIONAL DEBRIDEMENT HIP (Right)  SURGEON:  Surgeon(s) and Role:    Paralee Cancel, MD - Primary  PHYSICIAN ASSISTANT: Costella Hatcher, PA-C  ANESTHESIA:   spinal  EBL:  800 mL   BLOOD ADMINISTERED:none  DRAINS: (one medium) Hemovact drain(s) in the right hip space with  Suction Open   LOCAL MEDICATIONS USED:  1 gm of Vancomycin powder  SPECIMEN:  Source of Specimen:  right hip fluid  DISPOSITION OF SPECIMEN:  PATHOLOGY  COUNTS:  YES  TOURNIQUET:  * No tourniquets in log *  DICTATION: .Other Dictation: Dictation Number [id number]  PLAN OF CARE: Admit to inpatient   PATIENT DISPOSITION:  PACU - hemodynamically stable.   Delay start of Pharmacological VTE agent (>24hrs) due to surgical blood loss or risk of bleeding: no

## 2021-07-08 NOTE — Interval H&P Note (Signed)
History and Physical Interval Note:  07/08/2021 2:04 PM  Reginald Walsh  has presented today for surgery, with the diagnosis of Right superfical hip infection.  The various methods of treatment have been discussed with the patient and family. After consideration of risks, benefits and other options for treatment, the patient has consented to  Procedure(s) with comments: EXCISIONAL AND NON EXCISIONAL DEBRIDEMENT HIP, POSSIBLE HEAD LINER EXCHANGE (Right) - [name] TO FOLLOW as a surgical intervention.  The patient's history has been reviewed, patient examined, no change in status, stable for surgery.  I have reviewed the patient's chart and labs.  Questions were answered to the patient's satisfaction.     Mauri Pole

## 2021-07-08 NOTE — Discharge Instructions (Signed)

## 2021-07-08 NOTE — Transfer of Care (Signed)
Immediate Anesthesia Transfer of Care Note  Patient: [name]  Procedure(s) Performed: EXCISIONAL AND NON EXCISIONAL DEBRIDEMENT HIP (Right: Hip)  Patient Location: PACU  Anesthesia Type:Spinal  Level of Consciousness: awake, alert  and patient cooperative  Airway & Oxygen Therapy: Patient Spontanous Breathing and Patient connected to face mask oxygen  Post-op Assessment: Report given to RN and Post -op Vital signs reviewed and stable  Post vital signs: Reviewed and stable  Last Vitals:  Vitals Value Taken Time  BP 116/72 07/08/21 1742  Temp    Pulse 63 07/08/21 1745  Resp 12 07/08/21 1745  SpO2 100 % 07/08/21 1745  Vitals shown include unvalidated device data.  Last Pain:  Vitals:   07/08/21 1351  TempSrc:   PainSc: 5       Patients Stated Pain Goal: 3 (79/43/27 6147)  Complications: No notable events documented.

## 2021-07-08 NOTE — Anesthesia Postprocedure Evaluation (Signed)
Anesthesia Post Note  Patient: Reginald Walsh  Procedure(s) Performed: EXCISIONAL AND NON EXCISIONAL DEBRIDEMENT HIP (Right: Hip)     Patient location during evaluation: PACU Anesthesia Type: Spinal Level of consciousness: awake, awake and alert and oriented Pain management: pain level controlled Vital Signs Assessment: post-procedure vital signs reviewed and stable Respiratory status: spontaneous breathing, nonlabored ventilation and respiratory function stable Cardiovascular status: blood pressure returned to baseline and stable Postop Assessment: no headache, no backache, spinal receding and no apparent nausea or vomiting Anesthetic complications: no   No notable events documented.  Last Vitals:  Vitals:   07/08/21 2129 07/08/21 2217  BP:  119/85  Pulse:  68  Resp:  18  Temp:  36.6 C  SpO2: 96% 96%    Last Pain:  Vitals:   07/08/21 2217  TempSrc: Oral  PainSc:                  [name]

## 2021-07-08 NOTE — Op Note (Signed)
Reginald Walsh, Reginald Walsh MEDICAL RECORD NO: [id number] ACCOUNT NO: [id number] DATE OF BIRTH: 12-13-1965 FACILITY: [facility]: [id number] PHYSICIAN: Reginald Cassis. Alvan Dame, MD  Operative Report   DATE OF PROCEDURE: 07/08/2021  PREOPERATIVE DIAGNOSIS:  History of right total hip arthroplasty with infection.  POSTOPERATIVE DIAGNOSIS:  History of right total hip arthroplasty with infection.  PROCEDURE:  Excisional and non-excisional debridement of right hip joint.  Excisional debridement including his skin of an incision that was approximately 6-7 inches long.  It included skin and subcutaneous nonviable tissue.  There was some muscle debridement as well as nonviable scar.  The non-excisional debridement was carried out with 6 liters of normal saline solution with pulse lavage as well as 350 mL of Prontosan antimicrobial solution.  SURGEON:  Reginald Cassis. Alvan Dame, MD  ASSISTANT:  Reginald Hatcher, PA-C.  Note, Reginald Walsh was present for the entirety of the case from preoperative positioning, perioperative management of the operative extremity, general facilitation of the case and primary wound closure.  ANESTHESIA:  Spinal anesthesia.  BLOOD LOSS:  About 800 mL.  DRAINS:  One medium Hemovac drain was placed.  SPECIMENS:  I did upon entry into the wound take culture swabs for evaluation.  It is important to note that he had been on antibiotics prior to surgery including cefadroxil and Keflex.  NDICATIONS FOR THE PROCEDURE:  The patient is a 56 year old male who is about 3 months out from his right total hip replacement.  His postoperative  course was as follows.  Initially at his 2-week visit, he was doing very well.  He had a history of left  total hip replacement and was progressing in similar fashion.  There was no evidence of any wound concerns at dressing change.  He presented shortly thereafter with significant right hip erythematous change consistent with a reactive cellulitic response.   He was  treated with cefadroxil and responded this very well.  The redness cleared. After a course of the antibiotics, it was maintained for a total of 4 weeks approximately.  He stopped the antibiotics and within a few days of that and the noted  improvement, he noted increased erythema and some swelling in the distal aspect of his incision. Given these findings, I felt that at this point, we had failed attempts at conservative treatment and that what was once an obvious cellulitic response had  now become more of a wound involved issue.  I recommended that we go to the operating room and to evaluate and manage the infection.  We discussed the postoperative course including the use of IV antibiotics for 6 weeks with the hope of joint salvage.   Consent was obtained for management of the infection.  PROCEDURE IN DETAIL:  The patient was brought to the operative theater.  Once adequate anesthesia, preoperative antibiotics, which were given, 2 grams of Ancef, he was positioned supine on the Hana table.  The right hip was positioned and predraped.  The  right hip was then prepped and draped in sterile fashion.  A timeout was performed identifying the patient, planned procedure, and extremity.  His old incision was excised over the entire extent of about a 7-inch incision.  The skin was excised followed  by debridement of subcutaneous nonviable scar tissue.  I was able to eventually identify the plane of the tensor fascia lata muscle.  I incised this and tried to elevate the muscle off the undersurface of the fascia; however, found that it was fairly  scarred.  It was  a challenge to identify the soft tissue planes based on the scarring and the oozing due to the infection response, but also the close proximity to his surgery.  There was abundant scar in this area.  It is important to note that as we went through the inferior aspect of the incision where he had the swelling, there was obvious nonviable infectious  looking material in this area with penetration through the fascia down to the lateral aspect of the hip.   For this reason, we identified that it was directly needed to get down to the joint in order to best debride and salvage his joint.  I did bring fluoroscopy into the field as I was trying to identify soft tissue planes.  I was able to get a retractor  over the anterior aspect of the hip as well as inferiorly.  I then performed significant scar debridement, which did pose a bit of a challenge as based on the anatomy and the abundant scar tissue.  Based on the challenges of exposure and the amount of  scar tissue that was present, I did elect at this point to maintain his components.  We did apply traction during the non-excisional debridement, irrigated the hip with 3 liters of normal saline solution and then Prontosan solution to try to get behind  the joint surface itself.  Once I had done this, we did some further debridement.  I was able to visualize the entire aspect of the hip joint.  Again, it was noted that he had a significant amount of scar in this area.  We then followed this up with  another 3 liters of normal saline solution with pulse lavage.  I then used the remaining portion of the Prontosan to irrigate the wound surfaces.  He did have a lot of punctate oozing in this area that was difficult to obtain true hemostasis, but there  was no active bleeding noted.  I elected to place a medium Hemovac drain deep to help with postoperative swelling.  This was taken out over the anterior aspect of his mid thigh.  This was placed deep in the wound.  I then reapproximated the layer of the  tensor fascia lata with #1 Vicryl.  I then based on his skin and the problems with his infection, I elected to use 2-0 nylon closure.  Multiple horizontal mattress sutures were used to close the skin.  The skin was cleaned, dried, and dressed sterilely  using Xeroform and then Mepilex dressing and the drain site  was dressed separately.  He was then awoken from anesthesia and brought to the recovery room.  Postoperatively, we will consult Infectious Disease.  We will start him on vancomycin and Ancef.  Cultures were sent to identify any speciation of bacteria.  We will place an order for PICC line with anticipation to be on IV antibiotics for 6 weeks and  suppressive antibiotics for up to 6 months.   NIK D: 07/08/2021 5:59:50 pm T: 07/08/2021 10:34:00 pm  JOB: [id number]/ [id number]

## 2021-07-08 NOTE — Anesthesia Preprocedure Evaluation (Signed)
Anesthesia Evaluation  Patient identified by MRN, date of birth, ID band Patient awake    Reviewed: Allergy & Precautions, NPO status , Patient's Chart, lab work & pertinent test results, reviewed documented beta blocker date and time   Airway Mallampati: III  TM Distance: >3 FB Neck ROM: Full    Dental  (+) Teeth Intact, Dental Advisory Given   Pulmonary asthma , COPD,  COPD inhaler, former smoker,    Pulmonary exam normal breath sounds clear to auscultation       Cardiovascular hypertension, Pt. on home beta blockers Normal cardiovascular exam Rhythm:Regular Rate:Normal     Neuro/Psych PSYCHIATRIC DISORDERS Anxiety Depression  Neuromuscular disease    GI/Hepatic Neg liver ROS, GERD  Medicated,  Endo/Other  negative endocrine ROS  Renal/GU negative Renal ROS     Musculoskeletal  (+) Arthritis , Right superfical hip infection   Abdominal   Peds  Hematology  (+) Blood dyscrasia, anemia , Plt 300k   Anesthesia Other Findings Day of surgery medications reviewed with the patient.  Reproductive/Obstetrics                             Anesthesia Physical Anesthesia Plan  ASA: 2  Anesthesia Plan: Spinal   Post-op Pain Management: Tylenol PO (pre-op)*   Induction: Intravenous  PONV Risk Score and Plan: 1 and Midazolam, Dexamethasone, Ondansetron and TIVA  Airway Management Planned: Natural Airway and Nasal Cannula  Additional Equipment:   Intra-op Plan:   Post-operative Plan:   Informed Consent: I have reviewed the patients History and Physical, chart, labs and discussed the procedure including the risks, benefits and alternatives for the proposed anesthesia with the patient or authorized representative who has indicated his/her understanding and acceptance.     Dental advisory given  Plan Discussed with: CRNA  Anesthesia Plan Comments:         Anesthesia Quick  Evaluation

## 2021-07-09 ENCOUNTER — Encounter (HOSPITAL_COMMUNITY): Payer: Self-pay | Admitting: Orthopedic Surgery

## 2021-07-09 ENCOUNTER — Encounter (HOSPITAL_COMMUNITY): Payer: No Typology Code available for payment source

## 2021-07-09 ENCOUNTER — Inpatient Hospital Stay: Payer: Self-pay

## 2021-07-09 DIAGNOSIS — T8451XA Infection and inflammatory reaction due to internal right hip prosthesis, initial encounter: Secondary | ICD-10-CM | POA: Diagnosis not present

## 2021-07-09 DIAGNOSIS — T8451XD Infection and inflammatory reaction due to internal right hip prosthesis, subsequent encounter: Secondary | ICD-10-CM | POA: Diagnosis not present

## 2021-07-09 DIAGNOSIS — D649 Anemia, unspecified: Secondary | ICD-10-CM

## 2021-07-09 LAB — BASIC METABOLIC PANEL
Anion gap: 9 (ref 5–15)
BUN: 10 mg/dL (ref 6–20)
CO2: 26 mmol/L (ref 22–32)
Calcium: 8.6 mg/dL — ABNORMAL LOW (ref 8.9–10.3)
Chloride: 98 mmol/L (ref 98–111)
Creatinine, Ser: 0.85 mg/dL (ref 0.61–1.24)
GFR, Estimated: >60 mL/min (ref >60)
Glucose, Bld: 191 mg/dL — ABNORMAL HIGH (ref 70–99)
Potassium: 4.8 mmol/L (ref 3.5–5.1)
Sodium: 133 mmol/L — ABNORMAL LOW (ref 135–145)

## 2021-07-09 LAB — CBC
HCT: 26.9 % — ABNORMAL LOW (ref 39.0–52.0)
Hemoglobin: 8.8 g/dL — ABNORMAL LOW (ref 13.0–17.0)
MCH: 29 pg (ref 26.0–34.0)
MCHC: 32.7 g/dL (ref 30.0–36.0)
MCV: 88.8 fL (ref 80.0–100.0)
Platelets: 279 10*3/uL (ref 150–400)
RBC: 3.03 MIL/uL — ABNORMAL LOW (ref 4.22–5.81)
RDW: 14.6 % (ref 11.5–15.5)
WBC: 6.2 10*3/uL (ref 4.0–10.5)
nRBC: 0 % (ref 0.0–0.2)

## 2021-07-09 MED ORDER — SODIUM CHLORIDE 0.9 % IV SOLN
8.0000 mg/kg | Freq: Every day | INTRAVENOUS | Status: DC
  Administered 2021-07-09 – 2021-07-14 (×6): 750 mg via INTRAVENOUS
  Filled 2021-07-09 (×6): qty 15

## 2021-07-09 MED ORDER — CHLORHEXIDINE GLUCONATE CLOTH 2 % EX PADS
6.0000 | MEDICATED_PAD | Freq: Every day | CUTANEOUS | Status: DC
  Administered 2021-07-09 – 2021-07-14 (×6): 6 via TOPICAL

## 2021-07-09 MED ORDER — SODIUM CHLORIDE 0.9% FLUSH: 10.0000 mL | INTRAVENOUS | Status: DC | PRN

## 2021-07-09 MED ORDER — SODIUM CHLORIDE 0.9 % IV SOLN
2.0000 g | INTRAVENOUS | Status: DC
  Administered 2021-07-09 – 2021-07-14 (×6): 2 g via INTRAVENOUS
  Filled 2021-07-09 (×6): qty 20

## 2021-07-09 NOTE — Consult Note (Signed)
[facility] for Infectious Disease       Reason for Consult: prosthetic joint infection   Referring Physician: Dr. Alvan Dame  Principal Problem:   Infection of right prosthetic hip joint ([place])    aspirin  81 mg Oral BID   carvedilol  25 mg Oral BID   docusate sodium  100 mg Oral BID   DULoxetine  60 mg Oral Daily   ferrous sulfate  325 mg Oral TID PC   fluticasone  2 spray Each Nare Daily   ipratropium-albuterol  3 mL Inhalation BID   pantoprazole  40 mg Oral Daily   pregabalin  150 mg Oral BID   rosuvastatin  20 mg Oral Daily    Recommendations: Continue daptomycin and ceftriaxone Will continue to monitor the culture [facility]  Assessment: He has a prosthetic joint infection s/p irrigation and debridement and culture with ngtd.  Will continue with above antibiotics and change with any positive cultures.  Will monitor cultures after discharge if cultures still pending at that time and can make adjustments as needed.    Antibiotics: As above  HPI: Reginald Walsh is a 56 y.o. male with a history of right total hip arthroplasty done on 04/14/21 by Dr. Alvan Dame who is now s/p operative excisional and non-excisional debridement done on 07/08/21.  His initial post operative course was complicated by cellulitis and was treated with oral cephalexin and initially improved but after stopping the antibiotics after about 4 weeks, his hip developed more redness and he underwent debridement yesterday.  Hip components were maintained and thoroughly irrigated.  He is now post op day 1 and his wife Reginald Walsh is at the bedside.     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   40 mm by echo   Chest pain of uncertain etiology    Normal coronaries after abnormal Myoview Feb 2021 Echo March 2021 showed normal  LVF- mild LVH    Chest pain of unknown etiology 02/2019   normal coronaries after abnormal Myoview   COPD (chronic obstructive pulmonary disease) ([place])    Dyspnea    Encounter for screening for COVID-19 07/01/2021   Generalized enlarged lymph nodes 07/01/2021   Hypertension    normal RA dopplers   Hyponatremia    Other allergy status, other than to drugs and biological substances 07/01/2021   Other and unspecified complications of medical care, not elsewhere classified 07/01/2021   Other ill-defined and unknown causes of morbidity and mortality 07/01/2021   Overweight    Pain in left knee 07/01/2021   Pain in right foot 07/01/2021    Social History   Tobacco Use   Smoking status: Former    Packs/day: 0.50    Years: 7.00    Total pack years: 3.50    Types: Cigarettes    Quit date: 1989    Years since quitting: 34.4   Smokeless tobacco: Current    Types: Snuff, Chew  Vaping Use   Vaping Use: Never used  Substance Use Topics   Alcohol use: Yes    Alcohol/week: 1.0 standard drink of alcohol    Types: 1 Cans of beer per week    Comment: occasional   Drug use: Never    Family History  Adopted: Yes  Problem Relation Age of Onset  Lung cancer Father    Prostate cancer Father        Marena Chancy of age of onset    Allergies  Allergen Reactions   Escitalopram Oxalate Anxiety    Physical Exam: Constitutional: in no apparent distress  Vitals:   07/09/21 0914 07/09/21 0951  BP:  123/89  Pulse:  89  Resp:  18  Temp:  97.6 F (36.4 C)  SpO2: 99% 99%   EYES: anicteric Respiratory: normal respiratory effort Musculoskeletal: no edema Skin: no rash  Lab Results  Component Value Date   WBC 6.2 07/09/2021   HGB 8.8 (L) 07/09/2021   HCT 26.9 (L) 07/09/2021   MCV 88.8 07/09/2021   PLT 279 07/09/2021    Lab Results  Component Value Date   CREATININE 0.85 07/09/2021   BUN 10 07/09/2021   NA 133 (L) 07/09/2021   K 4.8 07/09/2021   CL 98 07/09/2021   CO2 26  07/09/2021    Lab Results  Component Value Date   ALT 24 07/07/2021   AST 23 07/07/2021   ALKPHOS 100 07/07/2021     Microbiology: Recent Results (from the past 240 hour(s))  Surgical pcr screen     Status: None   Collection Time: 07/07/21  2:43 PM   Specimen: Nasal Mucosa; Nasal Swab  Result Value Ref Range Status   MRSA, PCR NEGATIVE NEGATIVE Final   Staphylococcus aureus NEGATIVE NEGATIVE Final    Comment: (NOTE) The Xpert SA Assay (FDA approved for NASAL specimens in patients 20 years of age and older), is one component of a comprehensive surveillance program. It is not intended to diagnose infection nor to guide or monitor treatment. Performed at [facility], [place] [address] [place], [place] [address]   Aerobic/Anaerobic Culture w Gram Stain (surgical/deep wound)     Status: None (Preliminary result)   Collection Time: 07/08/21  4:17 PM   Specimen: Wound  Result Value Ref Range Status   Specimen Description   Final    WOUND Performed at [facility] [address] [place], [place] [address]    Special Requests RIGHT HIP  Final   Gram Stain   Final    FEW WBC PRESENT,BOTH PMN AND MONONUCLEAR NO ORGANISMS SEEN    Culture   Final    NO GROWTH < 24 HOURS Performed at [facility], [place] [address], [place],  [address]    Report Status PENDING  Incomplete    Thayer Headings, [facility] for Infectious Disease [place] [email or web address] 07/09/2021, 1:38 PM

## 2021-07-09 NOTE — Progress Notes (Signed)
Peripherally Inserted Central Catheter Placement  The IV Nurse has discussed with the patient and/or persons authorized to consent for the patient, the purpose of this procedure and the potential benefits and risks involved with this procedure.  The benefits include less needle sticks, lab draws from the catheter, and the patient may be discharged home with the catheter. Risks include, but not limited to, infection, bleeding, blood clot (thrombus formation), and puncture of an artery; nerve damage and irregular heartbeat and possibility to perform a PICC exchange if needed/ordered by physician.  Alternatives to this procedure were also discussed.  Bard Power PICC patient education guide, fact sheet on infection prevention and patient information card has been provided to patient /or left at bedside.    PICC Placement Documentation  PICC Single Lumen 50/35/46 Right Basilic 42 cm (Active)  Indication for Insertion or Continuance of Line Home intravenous therapies (PICC only) 07/09/21 1510  Exposed Catheter (cm) 0 cm 07/09/21 1510  Site Assessment Clean, Dry, Intact 07/09/21 1510  Line Status Flushed;Saline locked;Blood return noted 07/09/21 1510  Dressing Type Transparent;Securing device 07/09/21 1510  Dressing Status Antimicrobial disc in place 07/09/21 [place] Not Applicable 56/81/27 5170  Dressing Change Due 07/16/21 07/09/21 [place] 07/09/2021, 3:13 PM

## 2021-07-09 NOTE — Evaluation (Signed)
Physical Therapy One Time Evaluation Patient Details Name: Reginald Walsh MRN: [id number] DOB: January 15, 1966 Today's Date: 07/09/2021  History of Present Illness  Pt is a 56 year old male s/p Excisional and non-excisional debridement of right hip joint due to Infection of right prosthetic hip joint  Clinical Impression  Patient evaluated by Physical Therapy with no further acute PT needs identified. All education has been completed and the patient has no further questions. Pt eager to mobilize and ambulated in hallway and practiced safe stair technique.  Pt reports he remembers HEP from previous hip surgery and did not feel he needed to review or perform these today.  Pt had no questions and feels ready for d/c home. See below for any follow-up Physical Therapy or equipment needs. PT is signing off. Thank you for this referral.        Recommendations for follow up therapy are one component of a multi-disciplinary discharge planning process, led by the attending physician.  Recommendations may be updated based on patient status, additional functional criteria and insurance authorization.  Follow Up Recommendations Follow physician's recommendations for discharge plan and follow up therapies    Assistance Recommended at Discharge PRN  Patient can return home with the following       Equipment Recommendations None recommended by PT  Recommendations for Other Services       Functional Status Assessment Patient has had a recent decline in their functional status and demonstrates the ability to make significant improvements in function in a reasonable and predictable amount of time.     Precautions / Restrictions Precautions Precautions: None Restrictions Weight Bearing Restrictions: No      Mobility  Bed Mobility Overal bed mobility: Needs Assistance Bed Mobility: Supine to Sit     Supine to sit: Min assist     General bed mobility comments: support for R LE over EOB     Transfers Overall transfer level: Needs assistance Equipment used: Rolling walker (2 wheels) Transfers: Sit to/from Stand Sit to Stand: Min guard           General transfer comment: verbal cues for hand placement    Ambulation/Gait Ambulation/Gait assistance: Min guard, Supervision Gait Distance (Feet): 200 Feet Assistive device: Rolling walker (2 wheels) Gait Pattern/deviations: Step-to pattern, Decreased stance time - right, Antalgic       General Gait Details: verbal cues for sequence, RW positioning, step length  Stairs Stairs: Yes Stairs assistance: Min guard Stair Management: Step to pattern, Forwards, Two rails Number of Stairs: 2 General stair comments: verbal cues for sequence; pt performed twice and reports understanding  Wheelchair Mobility    Modified Rankin (Stroke Patients Only)       Balance                                             Pertinent Vitals/Pain Pain Assessment Pain Assessment: 0-10 Pain Score: 3  Pain Location: right hip Pain Descriptors / Indicators: Sore Pain Intervention(s): Repositioned, Monitored during session, Premedicated before session    Home Living Family/patient expects to be discharged to:: Private residence Living Arrangements: Spouse/significant other   Type of Home: House Home Access: Stairs to enter Entrance Stairs-Rails: Can reach both Entrance Stairs-Number of Steps: 2   Home Layout: Bed/bath upstairs (pt can initially stay downstairs) Home Equipment: Conservation officer, nature (2 wheels)      Prior Function Prior  Level of Function : Independent/Modified Independent                     Hand Dominance        Extremity/Trunk Assessment        Lower Extremity Assessment Lower Extremity Assessment: RLE deficits/detail RLE Deficits / Details: grossly at leat 2+/5 hip strength observed       Communication   Communication: No difficulties  Cognition Arousal/Alertness:  Awake/alert Behavior During Therapy: WFL for tasks assessed/performed Overall Cognitive Status: Within Functional Limits for tasks assessed                                          General Comments      Exercises     Assessment/Plan    PT Assessment Patient does not need any further PT services  PT Problem List         PT Treatment Interventions      PT Goals (Current goals can be found in the Care Plan section)  Acute Rehab PT Goals PT Goal Formulation: All assessment and education complete, DC therapy    Frequency       Co-evaluation               AM-PAC PT "6 Clicks" Mobility  Outcome Measure Help needed turning from your back to your side while in a flat bed without using bedrails?: A Little Help needed moving from lying on your back to sitting on the side of a flat bed without using bedrails?: A Little Help needed moving to and from a bed to a chair (including a wheelchair)?: A Little Help needed standing up from a chair using your arms (e.g., wheelchair or bedside chair)?: A Little Help needed to walk in hospital room?: A Little Help needed climbing 3-5 steps with a railing? : A Little 6 Click Score: 18    End of Session Equipment Utilized During Treatment: Gait belt Activity Tolerance: Patient tolerated treatment well Patient left: in chair;with call bell/phone within reach;with family/visitor present;with nursing/sitter in room Nurse Communication: Mobility status PT Visit Diagnosis: Difficulty in walking, not elsewhere classified (R26.2)    Time: 9937-1696 PT Time Calculation (min) (ACUTE ONLY): 24 min   Charges:   PT Evaluation $PT Eval Low Complexity: 1 Low        Kati PT, DPT Acute Rehabilitation Services Pager: [phone] Office: [place] 07/09/2021, 11:05 AM

## 2021-07-09 NOTE — TOC Initial Note (Addendum)
Transition of Care [facility]) - Initial/Assessment Note    Patient Details  Name: Reginald Walsh MRN: [id number] Date of Birth: 1965-06-14  Transition of Care [facility]) CM/SW Contact:    Lennart Pall, LCSW Phone Number: 07/09/2021, 3:33 PM  Clinical Narrative:                 Met with pt and spouse today to introduce CSW/ TOC services and discuss dc needs.  Pt and wife aware/ agreeable with plan for IV abx at home and have no HH agency preference.  Have placed a referral with [facility] Carolynn Sayers, RN) who will connect with pt and spouse for teaching prior to pt's discharge.  Currently working on getting [facility] arranged as well.    Expected Discharge Plan: [place] Barriers to Discharge: Continued Medical Work up   Patient Goals and CMS Choice Patient states their goals for this hospitalization and ongoing recovery are:: return home      Expected Discharge Plan and Services Expected Discharge Plan: [place] In-house Referral: Clinical Social Work     Living arrangements for the past 2 months: Single Family Home                 DME Arranged: N/A DME Agency: NA       HH Arranged: IV Antibiotics HH Agency: Tour manager Date HH Agency Contacted: 07/09/21 Time HH Agency Contacted: 0930 Representative spoke with at [place]: Carolynn Sayers  Prior Living Arrangements/Services Living arrangements for the past 2 months: Single Family Home Lives with:: Spouse Patient language and need for interpreter reviewed:: Yes Do you feel safe going back to the place where you live?: Yes      Need for Family Participation in Patient Care: Yes (Comment) Care giver support system in place?: Yes (comment)   Criminal Activity/Legal Involvement Pertinent to Current Situation/Hospitalization: No - Comment as needed  Activities of Daily Living Home Assistive Devices/Equipment: Cane (specify quad or straight) ADL Screening (condition at time of admission) Patient's  cognitive ability adequate to safely complete daily activities?: Yes Is the patient deaf or have difficulty hearing?: No Does the patient have difficulty seeing, even when wearing glasses/contacts?: No Does the patient have difficulty concentrating, remembering, or making decisions?: No Patient able to express need for assistance with ADLs?: Yes Does the patient have difficulty dressing or bathing?: No Independently performs ADLs?: Yes (appropriate for developmental age) Does the patient have difficulty walking or climbing stairs?: Yes Weakness of Legs: Right Weakness of Arms/Hands: None  Permission Sought/Granted Permission sought to share information with : Family Supports Permission granted to share information with : Yes, Verbal Permission Granted  Share Information with NAME: Tacey Ruiz     Permission granted to share info w Relationship: wife  Permission granted to share info w Contact Information: [phone]  Emotional Assessment Appearance:: Appears stated age Attitude/Demeanor/Rapport: Gracious Affect (typically observed): Accepting Orientation: : Oriented to Self, Oriented to Place, Oriented to  Time, Oriented to Situation Alcohol / Substance Use: Not Applicable Psych Involvement: No (comment)  Admission diagnosis:  Infection of right prosthetic hip joint [facility]) [T84.51XA] Patient Active Problem List   Diagnosis Date Noted   Infection of right prosthetic hip joint ([place]) 07/08/2021   Alcohol abuse 07/01/2021   Allergic rhinitis 07/01/2021   Anxiety 07/01/2021   Asthma 07/01/2021   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   Cellulitis of right lower limb 07/01/2021   Chronic sinusitis 12/11/5206   Complication of surgical  procedure 07/01/2021   Depression 07/01/2021   Eczema 07/01/2021   Achilles tendinitis 07/01/2021   Flat foot 07/01/2021   History of alcohol abuse 07/01/2021   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   Other and unspecified  noninfectious gastroenteritis and colitis 07/01/2021   Other osteonecrosis, 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[place]) 07/01/2021   Presbyopia 07/01/2021   Thoracic or lumbosacral neuritis or radiculitis 07/01/2021   Tobacco use disorder 07/01/2021   Vitamin D deficiency 07/01/2021   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   History of total left hip arthroplasty 07/01/2021   Hyponatremia 04/21/2021   Hypomagnesemia 04/21/2021   Hypokalemia 04/21/2021   Chronic post-traumatic stress disorder 04/21/2021   Chronic obstructive pulmonary disease, unspecified ([place]) 04/21/2021   Gastroesophageal reflux disease 04/21/2021   Major depressive disorder, recurrent, moderate ([place]) 04/21/2021   Hyperlipidemia 04/21/2021   Cervical spondylosis 12/06/2019   Essential hypertension 06/02/2019   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[place]) 06/02/2019   Cervical radiculopathy 10/21/2016   Chronic back pain 04/21/2016   PCP:  Jeanie Sewer, NP Pharmacy:   [id number] [place], [address] [address]Phone: [phone]: [address] [address]Phone: [phone]: [phone]    Social Determinants of Health (SDOH) Interventions    Readmission Risk Interventions    07/09/2021    1:00 PM  Readmission Risk Prevention Plan  Post Dischage Appt Complete  Medication Screening Complete  Transportation Screening Complete

## 2021-07-09 NOTE — Progress Notes (Signed)
   Subjective: 1 Day Post-Op Procedure(s) (LRB): EXCISIONAL AND NON EXCISIONAL DEBRIDEMENT HIP (Right) Patient reports pain as moderate.   Patient seen in rounds by Dr. Alvan Dame. Patient is resting in bed on exam. No acute events overnight. PICC not yet placed.  We will start therapy today.   Objective: Vital signs in last 24 hours: Temp:  [97.3 F (36.3 C)-98.1 F (36.7 C)] 97.6 F (36.4 C) (06/14 0611) Pulse Rate:  [54-86] 72 (06/14 0611) Resp:  [10-20] 18 (06/14 0611) BP: (113-147)/(72-105) 113/80 (06/14 0611) SpO2:  [93 %-100 %] 93 % (06/14 0611) Weight:  [114.3 kg-114.8 kg] 114.8 kg (06/13 2013)  Intake/Output from previous day:  Intake/Output Summary (Last 24 hours) at 07/09/2021 0802 Last data filed at 07/09/2021 0653 Gross per 24 hour  Intake 3006.83 ml  Output 6160 ml  Net -3153.17 ml     Intake/Output this shift: No intake/output data recorded.  Labs: Recent Labs    07/07/21 1443  HGB 11.6*   Recent Labs    07/07/21 1443  WBC 6.1  RBC 4.03*  HCT 36.0*  PLT 300   Recent Labs    07/07/21 1443 07/09/21 0338  NA 138 133*  K 4.6 4.8  CL 102 98  CO2 27 26  BUN 12 10  CREATININE 1.05 0.85  GLUCOSE 98 191*  CALCIUM 9.3 8.6*   No results for input(s): "LABPT", "INR" in the last 72 hours.  Exam: General - Patient is Alert and Oriented Extremity - Neurologically intact Sensation intact distally Intact pulses distally Dorsiflexion/Plantar flexion intact Dressing - dressing C/D/I Motor Function - intact, moving foot and toes well on exam.   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   40 mm by echo   Chest pain of uncertain etiology    Normal coronaries after abnormal Myoview Feb 2021 Echo March 2021 showed normal LVF- mild LVH    Chest pain of unknown etiology 02/2019   normal coronaries after abnormal Myoview   COPD (chronic obstructive pulmonary disease) ([place])    Dyspnea     Encounter for screening for COVID-19 07/01/2021   Generalized enlarged lymph nodes 07/01/2021   Hypertension    normal RA dopplers   Hyponatremia    Other allergy status, other than to drugs and biological substances 07/01/2021   Other and unspecified complications of medical care, not elsewhere classified 07/01/2021   Other ill-defined and unknown causes of morbidity and mortality 07/01/2021   Overweight    Pain in left knee 07/01/2021   Pain in right foot 07/01/2021    Assessment/Plan: 1 Day Post-Op Procedure(s) (LRB): EXCISIONAL AND NON EXCISIONAL DEBRIDEMENT HIP (Right) Principal Problem:   Infection of right prosthetic hip joint ([place])  Estimated body mass index is 31.63 kg/m as calculated from the following:   Height as of this encounter: '6\' 3"'$  (1.905 m).   Weight as of this encounter: 114.8 kg. Advance diet Up with therapy D/C IV fluids  DVT Prophylaxis - Aspirin Weight bearing as tolerated.  Hgb stable at 11.6 this AM. Cr. .85  PICC to be placed today. Continue Vanc and Ancef for now. Will consult ID for recommendations today; appreciate their assistance. Intra-op cultures taken, so far no organisms seen on gram stain.   Plan to stay overnight to address above issues.  Griffith Citron, PA-C Orthopedic Surgery [phone] 07/09/2021, 8:02 AM

## 2021-07-10 DIAGNOSIS — D649 Anemia, unspecified: Secondary | ICD-10-CM | POA: Diagnosis not present

## 2021-07-10 DIAGNOSIS — T8451XD Infection and inflammatory reaction due to internal right hip prosthesis, subsequent encounter: Secondary | ICD-10-CM | POA: Diagnosis not present

## 2021-07-10 LAB — CBC
HCT: 22.8 % — ABNORMAL LOW (ref 39.0–52.0)
Hemoglobin: 7.4 g/dL — ABNORMAL LOW (ref 13.0–17.0)
MCH: 28.9 pg (ref 26.0–34.0)
MCHC: 32.5 g/dL (ref 30.0–36.0)
MCV: 89.1 fL (ref 80.0–100.0)
Platelets: 312 10*3/uL (ref 150–400)
RBC: 2.56 MIL/uL — ABNORMAL LOW (ref 4.22–5.81)
RDW: 15.3 % (ref 11.5–15.5)
WBC: 8.2 10*3/uL (ref 4.0–10.5)
nRBC: 0 % (ref 0.0–0.2)

## 2021-07-10 LAB — CK: Total CK: 136 U/L (ref 49–397)

## 2021-07-10 MED ORDER — METHOCARBAMOL 500 MG PO TABS: 500.0000 mg | ORAL_TABLET | Freq: Four times a day (QID) | ORAL | 0 refills | Status: DC | PRN

## 2021-07-10 MED ORDER — ASPIRIN 81 MG PO CHEW: 81.0000 mg | CHEWABLE_TABLET | Freq: Two times a day (BID) | ORAL | 0 refills | Status: DC

## 2021-07-10 MED ORDER — ACETAMINOPHEN 325 MG PO TABS: 1000.0000 mg | ORAL_TABLET | Freq: Four times a day (QID) | ORAL | Status: DC

## 2021-07-10 MED ORDER — TRANEXAMIC ACID 650 MG PO TABS (ORTHO)
1950.0000 mg | ORAL_TABLET | Freq: Once | ORAL | Status: AC
Start: 2021-07-10 — End: 2021-07-10
  Administered 2021-07-10: 1950 mg via ORAL
  Filled 2021-07-10: qty 3

## 2021-07-10 MED ORDER — DOCUSATE SODIUM 100 MG PO CAPS: 100.0000 mg | ORAL_CAPSULE | Freq: Two times a day (BID) | ORAL | 0 refills | Status: DC

## 2021-07-10 MED ORDER — POLYETHYLENE GLYCOL 3350 17 G PO PACK: 17.0000 g | PACK | Freq: Every day | ORAL | 0 refills | Status: DC | PRN

## 2021-07-10 MED ORDER — OXYCODONE HCL 5 MG PO TABS: 5.0000 mg | ORAL_TABLET | ORAL | 0 refills | Status: DC | PRN

## 2021-07-10 MED ORDER — FERROUS SULFATE 325 (65 FE) MG PO TABS: 325.0000 mg | ORAL_TABLET | Freq: Three times a day (TID) | ORAL | 0 refills | Status: DC

## 2021-07-10 NOTE — Plan of Care (Signed)
  Problem: Education: Goal: Knowledge of General Education information will improve Description: Including pain rating scale, medication(s)/side effects and non-pharmacologic comfort measures Outcome: Progressing   Problem: Health Behavior/Discharge Planning: Goal: Ability to manage health-related needs will improve Outcome: Progressing   Problem: Clinical Measurements: Goal: Respiratory complications will improve Outcome: Not Applicable Goal: Cardiovascular complication will be avoided Outcome: Not Applicable   Problem: Nutrition: Goal: Adequate nutrition will be maintained Outcome: Progressing   Problem: Coping: Goal: Level of anxiety will decrease Outcome: Progressing   Problem: Pain Managment: Goal: General experience of comfort will improve Outcome: Progressing

## 2021-07-10 NOTE — Progress Notes (Signed)
PHARMACY CONSULT NOTE FOR:  OUTPATIENT  PARENTERAL ANTIBIOTIC THERAPY (OPAT)  Indication: PJI  Regimen: Dapto '750mg'$  IV q24h End date: 07/09/21>>08/20/2021  IV antibiotic discharge orders are pended. To discharging provider:  please sign these orders via discharge navigator,  Select New Orders & click on the button choice - Manage This Unsigned Work.     Thank you for allowing pharmacy to be a part of this patient's care.   Ethanjames Fontenot S. Alford Highland, PharmD, BCPS Clinical Staff Pharmacist [email or web address] [place] 07/10/2021, 10:41 AM

## 2021-07-10 NOTE — Plan of Care (Signed)
  Problem: Pain Managment: Goal: General experience of comfort will improve Outcome: Progressing   Problem: Safety: Goal: Ability to remain free from injury will improve Outcome: Progressing   

## 2021-07-10 NOTE — Progress Notes (Signed)
Subjective: 2 Days Post-Op Procedure(s) (LRB): EXCISIONAL AND NON EXCISIONAL DEBRIDEMENT HIP (Right) Patient reports pain as mild.   Patient seen in rounds with Dr. Alvan Dame. Patient is resting in bed on exam with wife at the bedside. He is very concerned about his dressing change needs as well as his low hemoglobin. No acute events overnight.  We will continue therapy today.   Objective: Vital signs in last 24 hours: Temp:  [97.6 F (36.4 C)-97.8 F (36.6 C)] 97.7 F (36.5 C) (06/15 0643) Pulse Rate:  [78-91] 83 (06/15 0643) Resp:  [18] 18 (06/15 0643) BP: (117-135)/(68-89) 135/84 (06/15 0643) SpO2:  [93 %-99 %] 99 % (06/15 0643)  Intake/Output from previous day:  Intake/Output Summary (Last 24 hours) at 07/10/2021 0828 Last data filed at 07/10/2021 0643 Gross per 24 hour  Intake 735.44 ml  Output 2900 ml  Net -2164.56 ml     Intake/Output this shift: No intake/output data recorded.  Labs: Recent Labs    07/07/21 1443 07/09/21 0733 07/10/21 0238  HGB 11.6* 8.8* 7.4*   Recent Labs    07/09/21 0733 07/10/21 0238  WBC 6.2 8.2  RBC 3.03* 2.56*  HCT 26.9* 22.8*  PLT 279 312   Recent Labs    07/07/21 1443 07/09/21 0338  NA 138 133*  K 4.6 4.8  CL 102 98  CO2 27 26  BUN 12 10  CREATININE 1.05 0.85  GLUCOSE 98 191*  CALCIUM 9.3 8.6*   No results for input(s): "LABPT", "INR" in the last 72 hours.  Exam: General - Patient is Alert and Oriented Extremity - Neurologically intact Sensation intact distally Intact pulses distally Dorsiflexion/Plantar flexion intact Dressing - Nylon stitches in place on the incision with incision intact. There is a small amount of bloody drainage from the incision. We cleaned and redressed this today with xeroform, gauze, ABDs.  Motor Function - intact, moving foot and toes well on exam.   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   40 mm by echo   Chest pain  of uncertain etiology    Normal coronaries after abnormal Myoview Feb 2021 Echo March 2021 showed normal LVF- mild LVH    Chest pain of unknown etiology 02/2019   normal coronaries after abnormal Myoview   COPD (chronic obstructive pulmonary disease) ([place])    Dyspnea    Encounter for screening for COVID-19 07/01/2021   Generalized enlarged lymph nodes 07/01/2021   Hypertension    normal RA dopplers   Hyponatremia    Other allergy status, other than to drugs and biological substances 07/01/2021   Other and unspecified complications of medical care, not elsewhere classified 07/01/2021   Other ill-defined and unknown causes of morbidity and mortality 07/01/2021   Overweight    Pain in left knee 07/01/2021   Pain in right foot 07/01/2021    Assessment/Plan: 2 Days Post-Op Procedure(s) (LRB): EXCISIONAL AND NON EXCISIONAL DEBRIDEMENT HIP (Right) Principal Problem:   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   Weight as of this encounter: 114.8 kg. Advance diet Up with therapy  DVT Prophylaxis - Aspirin Weight bearing as tolerated.  ABLA: Hgb 7.4 this AM. Will transfuse 2 units if <7 tomorrow AM. Will recheck in the AM.  PICC line in place. ID recommendations in. Dapto and  ceftriaxone for now pending final cultures.   Patient and wife have concerns about managing dressing changes at home.   We will plan to keep him overnight to check CBC tomorrow. If <7, will transfuse. Otherwise, will also continue monitoring surgical site. If significant drainage tomorrow, may keep him again in order to continue managing this here. If incision is manageable, may place Aquacel for discharge.   Griffith Citron, PA-C Orthopedic Surgery [phone] 07/10/2021, 8:28 AM

## 2021-07-10 NOTE — Progress Notes (Signed)
PHARMACY CONSULT NOTE FOR:  OUTPATIENT  PARENTERAL ANTIBIOTIC THERAPY (OPAT)  Indication: R hip prosthetic joint Regimen: daptomycin 750 mg IV QD + ceftriaxone 2 g IV QD End date: 08/18/2021  IV antibiotic discharge orders are pended. To discharging provider:  please sign these orders via discharge navigator,  Select New Orders & click on the button choice - Manage This Unsigned Work.     Thank you for allowing pharmacy to be a part of this patient's care.  Reginald Walsh 07/10/2021, 2:58 PM

## 2021-07-10 NOTE — Progress Notes (Signed)
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    Labs weekly while on IV antibiotics: _x_ CBC with differential __ BMP _x_ CMP _x_ CRP __x ESR __ Vancomycin trough _x_ CK  _x_ Please pull PIC at completion of IV antibiotics __ Please leave PIC in place until doctor has seen patient or been notified  Fax weekly labs to [phone]  Clinic Follow Up Appt: 6/30  @ 4pm with Dr. Linus Salmons

## 2021-07-11 DIAGNOSIS — D649 Anemia, unspecified: Secondary | ICD-10-CM | POA: Diagnosis not present

## 2021-07-11 DIAGNOSIS — T8451XD Infection and inflammatory reaction due to internal right hip prosthesis, subsequent encounter: Secondary | ICD-10-CM | POA: Diagnosis not present

## 2021-07-11 LAB — CBC
HCT: 21.6 % — ABNORMAL LOW (ref 39.0–52.0)
Hemoglobin: 6.9 g/dL — CL (ref 13.0–17.0)
MCH: 29 pg (ref 26.0–34.0)
MCHC: 31.9 g/dL (ref 30.0–36.0)
MCV: 90.8 fL (ref 80.0–100.0)
Platelets: 222 10*3/uL (ref 150–400)
RBC: 2.38 MIL/uL — ABNORMAL LOW (ref 4.22–5.81)
RDW: 15.9 % — ABNORMAL HIGH (ref 11.5–15.5)
WBC: 7.1 10*3/uL (ref 4.0–10.5)
nRBC: 0 % (ref 0.0–0.2)

## 2021-07-11 LAB — PREPARE RBC (CROSSMATCH)

## 2021-07-11 LAB — HEMOGLOBIN AND HEMATOCRIT, BLOOD
HCT: 25 % — ABNORMAL LOW (ref 39.0–52.0)
Hemoglobin: 8 g/dL — ABNORMAL LOW (ref 13.0–17.0)

## 2021-07-11 MED ORDER — CEFTRIAXONE IV (FOR PTA / DISCHARGE USE ONLY): 2.0000 g | INTRAVENOUS | 0 refills | Status: DC

## 2021-07-11 MED ORDER — DAPTOMYCIN IV (FOR PTA / DISCHARGE USE ONLY): 750.0000 mg | INTRAVENOUS | 0 refills | Status: DC

## 2021-07-11 MED ORDER — ALPRAZOLAM 0.5 MG PO TABS
0.5000 mg | ORAL_TABLET | Freq: Once | ORAL | Status: AC
Start: 2021-07-11 — End: 2021-07-11
  Administered 2021-07-11: 0.5 mg via ORAL
  Filled 2021-07-11: qty 1

## 2021-07-11 MED ORDER — SODIUM CHLORIDE 0.9% IV SOLUTION: Freq: Once | INTRAVENOUS | Status: AC

## 2021-07-11 NOTE — Progress Notes (Signed)
   07/11/21 0600  Provider Notification  Provider Name/Title hgb 6.9 (unable to reach real person at the office phone disconnected after menu was over)  Date Provider Notified 07/11/21 (attempted)  Time Provider Notified 0559  Method of Notification Call  Notification Reason Critical result   Will notify provider when they make AM rounds

## 2021-07-11 NOTE — Progress Notes (Signed)
   Subjective: 3 Days Post-Op Procedure(s) (LRB): EXCISIONAL AND NON EXCISIONAL DEBRIDEMENT HIP (Right) Patient reports pain as mild.   Patient seen in rounds for Dr.Olin. Patient is anxious this AM, was having bandage changed by Vaughan Basta, RN during rounds. Still having bloody drainage requiring dressing changes throughout the night, not ready to have Aquacel placed. Reports lightheadedness as well. Requesting something for anxiety, history of PTSD only currently taking duloxetine.   Objective: Vital signs in last 24 hours: Temp:  [97.7 F (36.5 C)-98.3 F (36.8 C)] 98.3 F (36.8 C) (06/16 0511) Pulse Rate:  [69-94] 81 (06/16 0511) Resp:  [17-18] 18 (06/16 0511) BP: (106-139)/(67-90) 115/67 (06/16 0511) SpO2:  [97 %-99 %] 97 % (06/16 0511)  Intake/Output from previous day:  Intake/Output Summary (Last 24 hours) at 07/11/2021 0737 Last data filed at 07/11/2021 0600 Gross per 24 hour  Intake 770 ml  Output 4300 ml  Net -3530 ml    Intake/Output this shift: No intake/output data recorded.  Labs: Recent Labs    07/09/21 0733 07/10/21 0238 07/11/21 0438  HGB 8.8* 7.4* 6.9*   Recent Labs    07/10/21 0238 07/11/21 0438  WBC 8.2 7.1  RBC 2.56* 2.38*  HCT 22.8* 21.6*  PLT 312 222   Recent Labs    07/09/21 0338  NA 133*  K 4.8  CL 98  CO2 26  BUN 10  CREATININE 0.85  GLUCOSE 191*  CALCIUM 8.6*   No results for input(s): "LABPT", "INR" in the last 72 hours.  Exam: General - Patient is Alert and Oriented Extremity - Neurologically intact Neurovascular intact Sensation intact distally Dorsiflexion/Plantar flexion intact Dressing/Incision - Mild blood drainage. Fresh dressing applied with gauze and ABD. Motor Function - intact, moving foot and toes well on exam.   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   40 mm by echo   Chest pain of uncertain etiology    Normal coronaries after abnormal  Myoview Feb 2021 Echo March 2021 showed normal LVF- mild LVH    Chest pain of unknown etiology 02/2019   normal coronaries after abnormal Myoview   COPD (chronic obstructive pulmonary disease) ([place])    Dyspnea    Encounter for screening for COVID-19 07/01/2021   Generalized enlarged lymph nodes 07/01/2021   Hypertension    normal RA dopplers   Hyponatremia    Other allergy status, other than to drugs and biological substances 07/01/2021   Other and unspecified complications of medical care, not elsewhere classified 07/01/2021   Other ill-defined and unknown causes of morbidity and mortality 07/01/2021   Overweight    Pain in left knee 07/01/2021   Pain in right foot 07/01/2021    Assessment/Plan: 3 Days Post-Op Procedure(s) (LRB): EXCISIONAL AND NON EXCISIONAL DEBRIDEMENT HIP (Right) Principal Problem:   Infection of right prosthetic hip joint ([place])  Estimated body mass index is 31.63 kg/m as calculated from the following:   Height as of this encounter: '6\' 3"'$  (1.905 m).   Weight as of this encounter: 114.8 kg. Up with therapy  Weight-bearing as tolerated  2 units of PRBCs ordered, hemoglobin 6.9 this AM. Will check H&H following. 0.5 mg Xanax ordered x 1 dose for increased anxiety this AM.  Will likely need to stay over the weekend given need for dressing changes and transfusion.  Theresa Duty, PA-C Orthopedic Surgery [phone] 07/11/2021, 7:37 AM

## 2021-07-11 NOTE — Plan of Care (Signed)
  Problem: Coping: Goal: Level of anxiety will decrease Outcome: Progressing   Problem: Pain Managment: Goal: General experience of comfort will improve Outcome: Progressing   

## 2021-07-11 NOTE — Progress Notes (Signed)
    [facility] for Infectious Disease   Reason for visit: Follow up on PJI  Interval History: culture remains negative; WBC 7.1, remains afebrile.   Day 4 total antibiotics  Physical Exam: Constitutional:  Vitals:   07/11/21 0511 07/11/21 0813  BP: 115/67   Pulse: 81   Resp: 18   Temp: 98.3 F (36.8 C)   SpO2: 97% 97%   patient appears in NAD Respiratory: Normal respiratory effort; CTA B  Review of Systems: Constitutional: negative for fevers and chills  Lab Results  Component Value Date   WBC 7.1 07/11/2021   HGB 6.9 (LL) 07/11/2021   HCT 21.6 (L) 07/11/2021   MCV 90.8 07/11/2021   PLT 222 07/11/2021    Lab Results  Component Value Date   CREATININE 0.85 07/09/2021   BUN 10 07/09/2021   NA 133 (L) 07/09/2021   K 4.8 07/09/2021   CL 98 07/09/2021   CO2 26 07/09/2021    Lab Results  Component Value Date   ALT 24 07/07/2021   AST 23 07/07/2021   ALKPHOS 100 07/07/2021     Microbiology: Recent Results (from the past 240 hour(s))  Surgical pcr screen     Status: None   Collection Time: 07/07/21  2:43 PM   Specimen: Nasal Mucosa; Nasal Swab  Result Value Ref Range Status   MRSA, PCR NEGATIVE NEGATIVE Final   Staphylococcus aureus NEGATIVE NEGATIVE Final    Comment: (NOTE) The Xpert SA Assay (FDA approved for NASAL specimens in patients 66 years of age and older), is one component of a comprehensive surveillance program. It is not intended to diagnose infection nor to guide or monitor treatment. Performed at [facility], [place] [address], [place], [place] [address]   Aerobic/Anaerobic Culture w Gram Stain (surgical/deep wound)     Status: None (Preliminary result)   Collection Time: 07/08/21  4:17 PM   Specimen: Wound  Result Value Ref Range Status   Specimen Description   Final    WOUND Performed at [facility] [address] [place], [place] [address]    Special Requests RIGHT HIP  Final   Gram Stain    Final    FEW WBC PRESENT,BOTH PMN AND MONONUCLEAR NO ORGANISMS SEEN    Culture   Final    NO GROWTH 2 DAYS NO ANAEROBES ISOLATED; CULTURE IN PROGRESS FOR 5 DAYS Performed at [facility], [place] [address] [place], [place] [address]    Report Status PENDING  Incomplete    Impression/Plan:  1. PJI with retained hardware - no growth from the operative culture and he remains on treatment with daptomycin and ceftriaxone.  Plan for 6 weeks though culture not yet final so may still grow something.  I will continue to monitor the culture and adjust if indicated.    2.  Anemia - requiring transfusion and to stay over the weekend for monitoring.    3.  Disposition - Picc line in place, OPAT orders in (see previous note) and he has outpatient follow up.

## 2021-07-12 LAB — BPAM RBC
Blood Product Expiration Date: 202307092359
Blood Product Expiration Date: 202307092359
ISSUE DATE / TIME: 202306161301
ISSUE DATE / TIME: 202306161543
Unit Type and Rh: 6200
Unit Type and Rh: 6200

## 2021-07-12 LAB — TYPE AND SCREEN
ABO/RH(D): A POS
Antibody Screen: NEGATIVE
Unit division: 0
Unit division: 0

## 2021-07-12 MED ORDER — TRANEXAMIC ACID 650 MG PO TABS (ORTHO)
1950.0000 mg | ORAL_TABLET | Freq: Two times a day (BID) | ORAL | Status: DC
  Administered 2021-07-12 – 2021-07-14 (×5): 1950 mg via ORAL
  Filled 2021-07-12 (×6): qty 3

## 2021-07-12 NOTE — TOC Progression Note (Signed)
Transition of Care [facility]) - Progression Note    Patient Details  Name: Tipton Ballow MRN: [id number] Date of Birth: 1965/10/22  [facility]) CM/SW Contact  Ross Ludwig, [place] Phone Number: 07/12/2021, 10:32 AM  Clinical Narrative:     CSW was informed that patient is not medically ready for discharge.  CSW updated [facility] rep and [facility] agency.  CSW to continue to follow patient's progress throughout discharge planning.  Expected Discharge Plan: [place] Barriers to Discharge: Continued Medical Work up  Expected Discharge Plan and Services Expected Discharge Plan: [place] In-house Referral: Clinical Social Work     Living arrangements for the past 2 months: Single Family Home                 DME Arranged: N/A DME Agency: NA       HH Arranged: IV Antibiotics HH Agency: Tour manager Date HH Agency Contacted: 07/09/21 Time [place]: 0930 Representative spoke with at [place]: [place] (SDOH) Interventions    Readmission Risk Interventions    07/09/2021    1:00 PM  Readmission Risk Prevention Plan  Post Dischage Appt Complete  Medication Screening Complete  Transportation Screening Complete

## 2021-07-12 NOTE — Progress Notes (Signed)
Orthopedics Progress Note  Subjective: Pain under good control. Did not do much PT yesterday due to anemia and transfusions. Looking forward to more work getting mobile again today  Objective:  Vitals:   07/12/21 0512 07/12/21 0916  BP: 107/69   Pulse: 79   Resp: 18   Temp: (!) 97.4 F (36.3 C)   SpO2: 96% 92%    General: Awake and alert  Musculoskeletal: Right hip dressing CDI, no saturation. No pain with ROM of the ankle Neurovascularly intact  Lab Results  Component Value Date   WBC 7.1 07/11/2021   HGB 8.0 (L) 07/11/2021   HCT 25.0 (L) 07/11/2021   MCV 90.8 07/11/2021   PLT 222 07/11/2021       Component Value Date/Time   NA 133 (L) 07/09/2021 0338   NA 133 (L) 07/02/2020 0843   K 4.8 07/09/2021 0338   CL 98 07/09/2021 0338   CO2 26 07/09/2021 0338   GLUCOSE 191 (H) 07/09/2021 0338   BUN 10 07/09/2021 0338   BUN 11 07/02/2020 0843   CREATININE 0.85 07/09/2021 0338   CALCIUM 8.6 (L) 07/09/2021 0338   GFRNONAA >60 07/09/2021 0338   GFRAA 99 03/17/2019 1117    No results found for: "INR", "PROTIME"  Assessment/Plan:  s/p Procedure(s): EXCISIONAL AND NON EXCISIONAL DEBRIDEMENT HIP Stable this AM from an ortho standpoint. Still anemic so will need to go slow with mobilization.  Plan to keep the patient in hospital through the weekend per ID and Dr Alvan Dame.   Doran Heater. Veverly Fells, MD 07/12/2021 9:49 AM

## 2021-07-12 NOTE — Plan of Care (Signed)

## 2021-07-13 LAB — AEROBIC/ANAEROBIC CULTURE W GRAM STAIN (SURGICAL/DEEP WOUND): Culture: NO GROWTH

## 2021-07-13 NOTE — Plan of Care (Signed)

## 2021-07-13 NOTE — Progress Notes (Signed)
    Subjective:  Patient reports pain as mild to moderate.  Denies N/V/CP/SOB. Received 2 units PRBCs yesterday for ABLA.  Objective:   VITALS:   Vitals:   07/12/21 0945 07/12/21 1516 07/12/21 2117 07/13/21 0509  BP: 114/82 116/72 124/72 131/85  Pulse: 80 72 79 75  Resp: _0 Temp: 98.7 F (37.1 C) 98.3 F (36.8 C) 98.2 F (36.8 C) 97.8 F (36.6 C)  TempSrc: Oral Oral Oral Oral  SpO2: 95% 93% 95% 99%  Weight:      Height:        NAD ABD soft Sensation intact distally Intact pulses distally Dorsiflexion/Plantar flexion intact Incision: dressing with scant drainage. Incis c/d/I - drsg changed Compartment soft   Lab Results  Component Value Date   WBC 7.1 07/11/2021   HGB 8.0 (L) 07/11/2021   HCT 25.0 (L) 07/11/2021   MCV 90.8 07/11/2021   PLT 222 07/11/2021   BMET    Component Value Date/Time   NA 133 (L) 07/09/2021 0338   NA 133 (L) 07/02/2020 0843   K 4.8 07/09/2021 0338   CL 98 07/09/2021 0338   CO2 26 07/09/2021 0338   GLUCOSE 191 (H) 07/09/2021 0338   BUN 10 07/09/2021 0338   BUN 11 07/02/2020 0843   CREATININE 0.85 07/09/2021 0338   CALCIUM 8.6 (L) 07/09/2021 0338   EGFR 95 07/02/2020 0843   GFRNONAA >60 07/09/2021 0338     Assessment/Plan: 5 Days Post-Op   Principal Problem:   Infection of right prosthetic hip joint ([place])   WBAT with walker DVT ppx: Aspirin, SCDs, TEDS PO pain control PT/OT ABLA: hgb 8.0 this am Culture negative R hip PJI: continue dapto and ceftriaxone per ID Dispo: recheck labs tomorrow am, likely d/c home tomorrow   Hilton Cork Ashleyann Shoun 07/13/2021, 9:01 AM   Rod Can, MD [phone] [place] is now [facility]  [place] [address], [address], [place], [place] [address] Phone: [phone] [email or web address] Facebook  [employer]

## 2021-07-14 LAB — BASIC METABOLIC PANEL
Anion gap: 6 (ref 5–15)
BUN: 10 mg/dL (ref 6–20)
CO2: 29 mmol/L (ref 22–32)
Calcium: 9.3 mg/dL (ref 8.9–10.3)
Chloride: 102 mmol/L (ref 98–111)
Creatinine, Ser: 0.96 mg/dL (ref 0.61–1.24)
GFR, Estimated: >60 mL/min (ref >60)
Glucose, Bld: 94 mg/dL (ref 70–99)
Potassium: 4 mmol/L (ref 3.5–5.1)
Sodium: 137 mmol/L (ref 135–145)

## 2021-07-14 LAB — CBC
HCT: 26.3 % — ABNORMAL LOW (ref 39.0–52.0)
Hemoglobin: 8.4 g/dL — ABNORMAL LOW (ref 13.0–17.0)
MCH: 29.2 pg (ref 26.0–34.0)
MCHC: 31.9 g/dL (ref 30.0–36.0)
MCV: 91.3 fL (ref 80.0–100.0)
Platelets: 264 10*3/uL (ref 150–400)
RBC: 2.88 MIL/uL — ABNORMAL LOW (ref 4.22–5.81)
RDW: 15.9 % — ABNORMAL HIGH (ref 11.5–15.5)
WBC: 6.2 10*3/uL (ref 4.0–10.5)
nRBC: 0 % (ref 0.0–0.2)

## 2021-07-14 MED ORDER — HEPARIN SOD (PORK) LOCK FLUSH 100 UNIT/ML IV SOLN
250.0000 [IU] | INTRAVENOUS | Status: AC | PRN
  Administered 2021-07-14: 250 [IU]

## 2021-07-14 NOTE — Progress Notes (Signed)
IVT consulted for cap/flush home.  Upon arrival, RN, JT states pt needs another infusion.  RN to reconsult when ready for cap/flush.

## 2021-07-14 NOTE — Progress Notes (Signed)
Patient discharged to home w/ wife. Given all belongings, instructions, equipment. PICC flushed, capped. Verbalized understanding of all instructions. Escorted to pov via w/c.

## 2021-07-14 NOTE — TOC Transition Note (Signed)
Transition of Care [facility]) - CM/SW Discharge Note   Patient Details  Name: Reginald Walsh MRN: [id number] Date of Birth: 1965-09-19  Transition of Care [facility]) CM/SW Contact:  Ross Ludwig, LCSW Phone Number: 07/14/2021, 4:36 PM   Clinical Narrative:     CSW was informed that patient is ready for discharge today to go home with IV antibiotics.  CSW spoke to [facility] at [place], and she is aware that patient is discharging today.  Pam and [facility] are aware that patient is discharging today, and can provide his IV medications.  TOC signing off, please reconsult if social work needs arise.   Final next level of care: [place] Barriers to Discharge: Barriers Resolved   Patient Goals and CMS Choice Patient states their goals for this hospitalization and ongoing recovery are:: To return back home with IV antibiotics. CMS Medicare.gov Compare Post Acute Care list provided to:: Patient Choice offered to / list presented to : Patient  Discharge Placement                       Discharge Plan and Services In-house Referral: Clinical Social Work              DME Arranged: IV pump/equipment DME Agency: Other - Comment ([facility]) Date DME Agency Contacted: 07/14/21 Time DME Agency Contacted: [phone] Representative spoke with at DME Agency: [place]: RN, IV Antibiotics HH Agency: [facility] Date [place]: 07/14/21 Time [place]: 1636 Representative spoke with at [place]: [place] ([place]) Interventions     Readmission Risk Interventions    07/09/2021    1:00 PM  Readmission Risk Prevention Plan  Post Dischage Appt Complete  Medication Screening Complete  Transportation Screening Complete

## 2021-07-14 NOTE — Progress Notes (Signed)
Subjective: 6 Days Post-Op Procedure(s) (LRB): EXCISIONAL AND NON EXCISIONAL DEBRIDEMENT HIP (Right) Patient reports pain as mild.   Patient seen in rounds for Dr. Alvan Dame. Patient is resting in bed on exam this afternoon with his wife at the bedside. Dr. Alvan Dame saw him earlier this morning, and they discussed a plan of applying dermabond to the wound and redressing with aquacel. Patient is ready to get home today.    Objective: Vital signs in last 24 hours: Temp:  [97.8 F (36.6 C)-98.5 F (36.9 C)] 97.8 F (36.6 C) (06/19 1425) Pulse Rate:  [78-80] 80 (06/19 1425) Resp:  [18] 18 (06/19 1425) BP: (117-129)/(74-80) 120/76 (06/19 1425) SpO2:  [95 %-97 %] 97 % (06/19 0554)  Intake/Output from previous day:  Intake/Output Summary (Last 24 hours) at 07/14/2021 1626 Last data filed at 07/14/2021 1400 Gross per 24 hour  Intake 1548.1 ml  Output 5475 ml  Net -3926.9 ml     Intake/Output this shift: Total I/O In: 120 [P.O.:120] Out: 925 [Urine:925]  Labs: Recent Labs    07/11/21 2012 07/14/21 1015  HGB 8.0* 8.4*   Recent Labs    07/11/21 2012 07/14/21 1015  WBC  --  6.2  RBC  --  2.88*  HCT 25.0* 26.3*  PLT  --  264   Recent Labs    07/14/21 1015  NA 137  K 4.0  CL 102  CO2 29  BUN 10  CREATININE 0.96  GLUCOSE 94  CALCIUM 9.3   No results for input(s): "LABPT", "INR" in the last 72 hours.  Exam: General - Patient is Alert and Oriented Extremity - Neurologically intact Sensation intact distally Intact pulses distally Dorsiflexion/Plantar flexion intact Dressing - Bulky dressing removed with some bloody drainage. No active drainage from the wound. Wound cleaned with peroxide, and dermabond applied. Aquacel placed over top.  Motor Function - intact, moving foot and toes well on exam.   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   40 mm by echo   Chest pain of uncertain etiology    Normal  coronaries after abnormal Myoview Feb 2021 Echo March 2021 showed normal LVF- mild LVH    Chest pain of unknown etiology 02/2019   normal coronaries after abnormal Myoview   COPD (chronic obstructive pulmonary disease) ([place])    Dyspnea    Encounter for screening for COVID-19 07/01/2021   Generalized enlarged lymph nodes 07/01/2021   Hypertension    normal RA dopplers   Hyponatremia    Other allergy status, other than to drugs and biological substances 07/01/2021   Other and unspecified complications of medical care, not elsewhere classified 07/01/2021   Other ill-defined and unknown causes of morbidity and mortality 07/01/2021   Overweight    Pain in left knee 07/01/2021   Pain in right foot 07/01/2021    Assessment/Plan: 6 Days Post-Op Procedure(s) (LRB): EXCISIONAL AND NON EXCISIONAL DEBRIDEMENT HIP (Right) Principal Problem:   Infection of right prosthetic hip joint ([place])  Estimated body mass index is 31.63 kg/m as calculated from the following:   Height as of this encounter: '6\' 3"'$  (1.905 m).   Weight as of this encounter: 114.8 kg. Advance diet Up with therapy D/C IV fluids  DVT Prophylaxis - Aspirin Weight bearing as tolerated.  Hgb stable today at 8.4.   Plan is to go Home after hospital stay. Plan for discharge today after meeting goals with therapy. Follow up in  the office in 2 weeks.   Griffith Citron, PA-C Orthopedic Surgery [phone] 07/14/2021, 4:26 PM

## 2021-07-25 ENCOUNTER — Other Ambulatory Visit: Payer: Self-pay

## 2021-07-25 ENCOUNTER — Inpatient Hospital Stay: Payer: No Typology Code available for payment source | Admitting: Internal Medicine

## 2021-07-25 ENCOUNTER — Inpatient Hospital Stay (HOSPITAL_COMMUNITY)
Admission: AD | Admit: 2021-07-25 | Discharge: 2021-07-30 | DRG: 902 | Disposition: A | Discharge status: Home Health | Payer: Managed Care, Other (non HMO) | Source: Ambulatory Visit | Attending: Orthopedic Surgery | Admitting: Orthopedic Surgery

## 2021-07-25 DIAGNOSIS — J449 Chronic obstructive pulmonary disease, unspecified: Secondary | ICD-10-CM | POA: Diagnosis present

## 2021-07-25 DIAGNOSIS — Z87891 Personal history of nicotine dependence: Secondary | ICD-10-CM | POA: Diagnosis not present

## 2021-07-25 DIAGNOSIS — M25551 Pain in right hip: Secondary | ICD-10-CM

## 2021-07-25 DIAGNOSIS — Z720 Tobacco use: Secondary | ICD-10-CM | POA: Diagnosis not present

## 2021-07-25 DIAGNOSIS — I10 Essential (primary) hypertension: Secondary | ICD-10-CM | POA: Diagnosis present

## 2021-07-25 DIAGNOSIS — T8451XD Infection and inflammatory reaction due to internal right hip prosthesis, subsequent encounter: Principal | ICD-10-CM

## 2021-07-25 DIAGNOSIS — Z79899 Other long term (current) drug therapy: Secondary | ICD-10-CM

## 2021-07-25 DIAGNOSIS — Z6831 Body mass index (BMI) 31.0-31.9, adult: Secondary | ICD-10-CM | POA: Diagnosis not present

## 2021-07-25 DIAGNOSIS — M96840 Postprocedural hematoma of a musculoskeletal structure following a musculoskeletal system procedure: Secondary | ICD-10-CM | POA: Diagnosis present

## 2021-07-25 DIAGNOSIS — Z888 Allergy status to other drugs, medicaments and biological substances status: Secondary | ICD-10-CM | POA: Diagnosis not present

## 2021-07-25 DIAGNOSIS — T8131XA Disruption of external operation (surgical) wound, not elsewhere classified, initial encounter: Secondary | ICD-10-CM | POA: Diagnosis present

## 2021-07-25 DIAGNOSIS — Z96642 Presence of left artificial hip joint: Secondary | ICD-10-CM | POA: Diagnosis present

## 2021-07-25 DIAGNOSIS — I252 Old myocardial infarction: Secondary | ICD-10-CM | POA: Diagnosis not present

## 2021-07-25 DIAGNOSIS — E663 Overweight: Secondary | ICD-10-CM | POA: Diagnosis present

## 2021-07-25 DIAGNOSIS — Z7982 Long term (current) use of aspirin: Secondary | ICD-10-CM | POA: Diagnosis not present

## 2021-07-25 DIAGNOSIS — Y831 Surgical operation with implant of artificial internal device as the cause of abnormal reaction of the patient, or of later complication, without mention of misadventure at the time of the procedure: Secondary | ICD-10-CM | POA: Diagnosis present

## 2021-07-25 MED ORDER — TRANEXAMIC ACID 650 MG PO TABS (ORTHO)
1950.0000 mg | ORAL_TABLET | Freq: Two times a day (BID) | ORAL | Status: DC
  Filled 2021-07-25: qty 3

## 2021-07-25 MED ORDER — POLYETHYLENE GLYCOL 3350 17 G PO PACK
17.0000 g | PACK | Freq: Every day | ORAL | Status: DC
Start: 2021-07-26 — End: 2021-07-30
  Administered 2021-07-26 – 2021-07-30 (×3): 17 g via ORAL
  Filled 2021-07-25 (×3): qty 1

## 2021-07-25 MED ORDER — SODIUM CHLORIDE 0.9% FLUSH
10.0000 mL | Freq: Two times a day (BID) | INTRAVENOUS | Status: DC
  Administered 2021-07-25 – 2021-07-30 (×6): 10 mL

## 2021-07-25 MED ORDER — FLUTICASONE PROPIONATE 50 MCG/ACT NA SUSP
1.0000 | Freq: Two times a day (BID) | NASAL | Status: DC
Start: 2021-07-25 — End: 2021-07-30
  Administered 2021-07-25 – 2021-07-30 (×7): 1 via NASAL
  Filled 2021-07-25: qty 16

## 2021-07-25 MED ORDER — DULOXETINE HCL 60 MG PO CPEP
60.0000 mg | ORAL_CAPSULE | Freq: Every day | ORAL | Status: DC
Start: 2021-07-26 — End: 2021-07-30
  Administered 2021-07-26 – 2021-07-30 (×5): 60 mg via ORAL
  Filled 2021-07-25 (×5): qty 1

## 2021-07-25 MED ORDER — ACETAMINOPHEN 325 MG PO TABS: 325.0000 mg | ORAL_TABLET | Freq: Four times a day (QID) | ORAL | Status: DC | PRN

## 2021-07-25 MED ORDER — IPRATROPIUM-ALBUTEROL 0.5-2.5 (3) MG/3ML IN SOLN
3.0000 mL | Freq: Two times a day (BID) | RESPIRATORY_TRACT | Status: DC
Start: 2021-07-25 — End: 2021-07-27
  Administered 2021-07-26: 3 mL via RESPIRATORY_TRACT
  Filled 2021-07-25 (×2): qty 3

## 2021-07-25 MED ORDER — TRANEXAMIC ACID 650 MG PO TABS (ORTHO)
1950.0000 mg | ORAL_TABLET | Freq: Two times a day (BID) | ORAL | Status: DC
  Administered 2021-07-25 – 2021-07-28 (×5): 1950 mg via ORAL
  Filled 2021-07-25 (×7): qty 3

## 2021-07-25 MED ORDER — ACETAMINOPHEN 500 MG PO TABS
1000.0000 mg | ORAL_TABLET | Freq: Four times a day (QID) | ORAL | Status: AC
  Administered 2021-07-25 – 2021-07-29 (×10): 1000 mg via ORAL
  Filled 2021-07-25 (×10): qty 2

## 2021-07-25 MED ORDER — CHLORHEXIDINE GLUCONATE CLOTH 2 % EX PADS
6.0000 | MEDICATED_PAD | Freq: Every day | CUTANEOUS | Status: DC
  Administered 2021-07-25 – 2021-07-26 (×2): 6 via TOPICAL

## 2021-07-25 MED ORDER — METHOCARBAMOL 500 MG PO TABS
500.0000 mg | ORAL_TABLET | Freq: Four times a day (QID) | ORAL | Status: DC | PRN
  Administered 2021-07-25 – 2021-07-29 (×6): 500 mg via ORAL
  Filled 2021-07-25 (×6): qty 1

## 2021-07-25 MED ORDER — POLYETHYLENE GLYCOL 3350 17 G PO PACK: 17.0000 g | PACK | Freq: Every day | ORAL | Status: DC | PRN

## 2021-07-25 MED ORDER — KETOROLAC TROMETHAMINE 15 MG/ML IJ SOLN
7.5000 mg | Freq: Four times a day (QID) | INTRAMUSCULAR | Status: DC | PRN
Start: 2021-07-25 — End: 2021-07-27

## 2021-07-25 MED ORDER — DIPHENHYDRAMINE HCL 12.5 MG/5ML PO ELIX: 12.5000 mg | ORAL_SOLUTION | ORAL | Status: DC | PRN

## 2021-07-25 MED ORDER — OXYCODONE HCL 5 MG PO TABS
5.0000 mg | ORAL_TABLET | ORAL | Status: DC | PRN
  Administered 2021-07-25 – 2021-07-30 (×4): 10 mg via ORAL
  Filled 2021-07-25 (×4): qty 2

## 2021-07-25 MED ORDER — METHOCARBAMOL 1000 MG/10ML IJ SOLN: 500.0000 mg | Freq: Four times a day (QID) | INTRAVENOUS | Status: DC | PRN

## 2021-07-25 MED ORDER — PREGABALIN 75 MG PO CAPS
150.0000 mg | ORAL_CAPSULE | Freq: Two times a day (BID) | ORAL | Status: DC
  Administered 2021-07-25 – 2021-07-30 (×10): 150 mg via ORAL
  Filled 2021-07-25 (×10): qty 2

## 2021-07-25 MED ORDER — SODIUM CHLORIDE 0.9 % IV SOLN
750.0000 mg | INTRAVENOUS | Status: DC
  Administered 2021-07-26 – 2021-07-30 (×5): 750 mg via INTRAVENOUS
  Filled 2021-07-25 (×5): qty 15

## 2021-07-25 MED ORDER — PANTOPRAZOLE SODIUM 40 MG PO TBEC
40.0000 mg | DELAYED_RELEASE_TABLET | Freq: Every day | ORAL | Status: DC
  Administered 2021-07-25 – 2021-07-30 (×6): 40 mg via ORAL
  Filled 2021-07-25 (×6): qty 1

## 2021-07-25 MED ORDER — ADULT MULTIVITAMIN W/MINERALS CH
1.0000 | ORAL_TABLET | Freq: Every day | ORAL | Status: DC
  Administered 2021-07-26 – 2021-07-30 (×5): 1 via ORAL
  Filled 2021-07-25 (×5): qty 1

## 2021-07-25 MED ORDER — OXYCODONE HCL 5 MG PO TABS
10.0000 mg | ORAL_TABLET | ORAL | Status: DC | PRN
  Administered 2021-07-25 – 2021-07-30 (×17): 15 mg via ORAL
  Filled 2021-07-25 (×17): qty 3

## 2021-07-25 MED ORDER — SODIUM CHLORIDE 0.9 % IV SOLN
2.0000 g | INTRAVENOUS | Status: DC
  Administered 2021-07-26 – 2021-07-30 (×4): 2 g via INTRAVENOUS
  Filled 2021-07-25 (×4): qty 20

## 2021-07-25 MED ORDER — CARVEDILOL 25 MG PO TABS
25.0000 mg | ORAL_TABLET | Freq: Two times a day (BID) | ORAL | Status: DC
  Administered 2021-07-26 – 2021-07-30 (×9): 25 mg via ORAL
  Filled 2021-07-25 (×9): qty 1

## 2021-07-25 MED ORDER — SODIUM CHLORIDE 0.9% FLUSH: 10.0000 mL | INTRAVENOUS | Status: DC | PRN

## 2021-07-25 MED ORDER — ROSUVASTATIN CALCIUM 20 MG PO TABS
20.0000 mg | ORAL_TABLET | Freq: Every day | ORAL | Status: DC
Start: 2021-07-26 — End: 2021-07-30
  Administered 2021-07-26 – 2021-07-29 (×3): 20 mg via ORAL
  Filled 2021-07-25 (×3): qty 1

## 2021-07-25 MED ORDER — POLYVINYL ALCOHOL 1.4 % OP SOLN
1.0000 [drp] | OPHTHALMIC | Status: DC | PRN
Start: 2021-07-25 — End: 2021-07-30

## 2021-07-25 NOTE — Plan of Care (Signed)
Problem: Education: Goal: Knowledge of General Education information will improve Description: Including pain rating scale, medication(s)/side effects and non-pharmacologic comfort measures Outcome: Progressing   Problem: Clinical Measurements: Goal: Ability to maintain clinical measurements within normal limits will improve Outcome: Progressing   Problem: Pain Managment: Goal: General experience of comfort will improve Outcome: [place] V Page Lancon, RN 07/25/21 4:54 PM

## 2021-07-26 ENCOUNTER — Encounter (HOSPITAL_COMMUNITY): Payer: Self-pay | Admitting: Orthopedic Surgery

## 2021-07-26 LAB — CBC
HCT: 28.1 % — ABNORMAL LOW (ref 39.0–52.0)
Hemoglobin: 9.1 g/dL — ABNORMAL LOW (ref 13.0–17.0)
MCH: 29.7 pg (ref 26.0–34.0)
MCHC: 32.4 g/dL (ref 30.0–36.0)
MCV: 91.8 fL (ref 80.0–100.0)
Platelets: 208 10*3/uL (ref 150–400)
RBC: 3.06 MIL/uL — ABNORMAL LOW (ref 4.22–5.81)
RDW: 16 % — ABNORMAL HIGH (ref 11.5–15.5)
WBC: 4.3 10*3/uL (ref 4.0–10.5)
nRBC: 0 % (ref 0.0–0.2)

## 2021-07-26 LAB — BASIC METABOLIC PANEL
Anion gap: 6 (ref 5–15)
BUN: 11 mg/dL (ref 6–20)
CO2: 26 mmol/L (ref 22–32)
Calcium: 9 mg/dL (ref 8.9–10.3)
Chloride: 105 mmol/L (ref 98–111)
Creatinine, Ser: 0.74 mg/dL (ref 0.61–1.24)
GFR, Estimated: >60 mL/min (ref >60)
Glucose, Bld: 95 mg/dL (ref 70–99)
Potassium: 4 mmol/L (ref 3.5–5.1)
Sodium: 137 mmol/L (ref 135–145)

## 2021-07-26 LAB — SURGICAL PCR SCREEN
MRSA, PCR: NEGATIVE
Staphylococcus aureus: NEGATIVE

## 2021-07-26 NOTE — Progress Notes (Addendum)
Pt right hip dressing changed with wet gauze and foam dressings. Pt tolerated procedure well.

## 2021-07-26 NOTE — Progress Notes (Signed)
Patient ID: [name], male   DOB: 26-Apr-1965, 56 y.o.   MRN: [id number] Subjective:      Patient reports pain as mild this am. He was getting some sleep. No events  Objective:   VITALS:   Vitals:   07/26/21 0107 07/26/21 0517  BP: 113/72 114/65  Pulse: 71 67  Resp: 14 20  Temp: 98.2 F (36.8 C) 98.2 F (36.8 C)  SpO2: 90% 91%    Incision: scant drainage on the current dressing applied, different than from office  LABS Recent Labs    07/26/21 0333  HGB 9.1*  HCT 28.1*  WBC 4.3  PLT 208    Recent Labs    07/26/21 0333  NA 137  K 4.0  BUN 11  CREATININE 0.74  GLUCOSE 95    No results for input(s): "LABPT", "INR" in the last 72 hours.   Assessment/Plan: S/p I&D of right hip with persistent subcutaneous hematoma and resultant delayed wound healing with persistent seroma drainage     Plan: I plan to take him to the OR tomorrow am to evacuate the post op hematoma and primarily close the wound over a drain Regular diet today NPO after midnight Orders placed

## 2021-07-26 NOTE — Anesthesia Preprocedure Evaluation (Signed)
Anesthesia Evaluation  Patient identified by MRN, date of birth, ID band Patient awake    Reviewed: Allergy & Precautions, NPO status , Patient's Chart, lab work & pertinent test results, reviewed documented beta blocker date and time   History of Anesthesia Complications Negative for: history of anesthetic complications  Airway Mallampati: II  TM Distance: >3 FB Neck ROM: Full    Dental no notable dental hx. (+) Dental Advisory Given   Pulmonary asthma , COPD,  COPD inhaler, former smoker,    Pulmonary exam normal        Cardiovascular hypertension, Pt. on home beta blockers + Past MI  Normal cardiovascular exam  IMPRESSIONS    1. Left ventricular ejection fraction, by estimation, is 55 to 60%. The  left ventricle has normal function. The left ventricle has no regional  wall motion abnormalities. The left ventricular internal cavity size was  mildly dilated. Left ventricular  diastolic parameters are consistent with Grade I diastolic dysfunction  (impaired relaxation).  2. Right ventricular systolic function is normal. The right ventricular  size is normal.  3. The mitral valve is normal in structure. No evidence of mitral valve  regurgitation. No evidence of mitral stenosis.  4. The aortic valve is tricuspid. Aortic valve regurgitation is not  visualized. Mild aortic valve sclerosis is present, with no evidence of  aortic valve stenosis.  5. Aortic dilatation noted. There is mild dilatation of the aortic root,  measuring 40 mm. There is mild dilatation of the ascending aorta,  measuring 42 mm.   Cath 2021 ? There is mild left ventricular systolic dysfunction. ? LV end diastolic pressure is normal. ? There is no mitral valve regurgitation. ? The left ventricular ejection fraction is 45-50% by visual estimate.   1. No angiographic evidence of CAD 2. LVEDP 17 mmHg 3. Mild LV systolic dysfunction with segmental  wall motion abnormality  Recommendations: No further ischemic workup.     Neuro/Psych PSYCHIATRIC DISORDERS Anxiety Depression  Neuromuscular disease    GI/Hepatic Neg liver ROS, GERD  Medicated,  Endo/Other  negative endocrine ROS  Renal/GU negative Renal ROS     Musculoskeletal  (+) Arthritis , Right superfical hip infection   Abdominal   Peds  Hematology  (+) Blood dyscrasia, anemia , Plt 300k   Anesthesia Other Findings Day of surgery medications reviewed with the patient.  Reproductive/Obstetrics                            Anesthesia Physical  Anesthesia Plan  ASA: 3  Anesthesia Plan: General   Post-op Pain Management: Tylenol PO (pre-op)*   Induction: Intravenous  PONV Risk Score and Plan: 1 and Midazolam, Dexamethasone and Ondansetron  Airway Management Planned: LMA  Additional Equipment:   Intra-op Plan:   Post-operative Plan: Extubation in OR  Informed Consent: I have reviewed the patients History and Physical, chart, labs and discussed the procedure including the risks, benefits and alternatives for the proposed anesthesia with the patient or authorized representative who has indicated his/her understanding and acceptance.     Dental advisory given  Plan Discussed with: CRNA and Anesthesiologist  Anesthesia Plan Comments:        Anesthesia Quick Evaluation

## 2021-07-27 ENCOUNTER — Encounter (HOSPITAL_COMMUNITY)
Admission: AD | Disposition: A | Discharge status: Home Health | Payer: Self-pay | Source: Ambulatory Visit | Attending: Orthopedic Surgery

## 2021-07-27 ENCOUNTER — Inpatient Hospital Stay (HOSPITAL_COMMUNITY): Payer: Managed Care, Other (non HMO) | Admitting: Anesthesiology

## 2021-07-27 DIAGNOSIS — T8131XA Disruption of external operation (surgical) wound, not elsewhere classified, initial encounter: Secondary | ICD-10-CM

## 2021-07-27 DIAGNOSIS — I252 Old myocardial infarction: Secondary | ICD-10-CM

## 2021-07-27 DIAGNOSIS — Z87891 Personal history of nicotine dependence: Secondary | ICD-10-CM

## 2021-07-27 DIAGNOSIS — M96840 Postprocedural hematoma of a musculoskeletal structure following a musculoskeletal system procedure: Secondary | ICD-10-CM

## 2021-07-27 DIAGNOSIS — I10 Essential (primary) hypertension: Secondary | ICD-10-CM

## 2021-07-27 HISTORY — PX: HEMATOMA EVACUATION: SHX5118

## 2021-07-27 HISTORY — PX: INCISION AND DRAINAGE: SHX5863

## 2021-07-27 SURGERY — EVACUATION HEMATOMA
Anesthesia: General | Site: Hip | Laterality: Right

## 2021-07-27 MED ORDER — ACETAMINOPHEN 500 MG PO TABS
1000.0000 mg | ORAL_TABLET | Freq: Once | ORAL | Status: AC
  Administered 2021-07-27: 1000 mg via ORAL

## 2021-07-27 MED ORDER — CEFTRIAXONE IV (FOR PTA / DISCHARGE USE ONLY)
2.0000 g | INTRAVENOUS | Status: DC
Start: 2021-07-27 — End: 2021-07-27

## 2021-07-27 MED ORDER — PHENOL 1.4 % MT LIQD: 1.0000 | OROMUCOSAL | Status: DC | PRN

## 2021-07-27 MED ORDER — ACETAMINOPHEN 10 MG/ML IV SOLN: 1000.0000 mg | Freq: Four times a day (QID) | INTRAVENOUS | Status: DC

## 2021-07-27 MED ORDER — PANTOPRAZOLE SODIUM 40 MG PO TBEC: 40.0000 mg | DELAYED_RELEASE_TABLET | Freq: Every day | ORAL | Status: DC

## 2021-07-27 MED ORDER — TRANEXAMIC ACID-NACL 1000-0.7 MG/100ML-% IV SOLN
1000.0000 mg | INTRAVENOUS | Status: AC
  Administered 2021-07-27: 1000 mg via INTRAVENOUS

## 2021-07-27 MED ORDER — PROPOFOL 10 MG/ML IV BOLUS
INTRAVENOUS | Status: DC | PRN
  Administered 2021-07-27: 130 mg via INTRAVENOUS

## 2021-07-27 MED ORDER — SODIUM CHLORIDE 0.9 % IV SOLN: INTRAVENOUS | Status: DC

## 2021-07-27 MED ORDER — FLUTICASONE PROPIONATE 50 MCG/ACT NA SUSP
2.0000 | Freq: Every day | NASAL | Status: DC
Start: 2021-07-27 — End: 2021-07-27

## 2021-07-27 MED ORDER — DULOXETINE HCL 60 MG PO CPEP: 60.0000 mg | ORAL_CAPSULE | Freq: Every day | ORAL | Status: DC

## 2021-07-27 MED ORDER — CELECOXIB 200 MG PO CAPS
ORAL_CAPSULE | ORAL | Status: AC
  Filled 2021-07-27: qty 1

## 2021-07-27 MED ORDER — LIDOCAINE HCL (PF) 2 % IJ SOLN
INTRAMUSCULAR | Status: AC
  Filled 2021-07-27: qty 5

## 2021-07-27 MED ORDER — ONDANSETRON HCL 4 MG PO TABS: 4.0000 mg | ORAL_TABLET | Freq: Four times a day (QID) | ORAL | Status: DC | PRN

## 2021-07-27 MED ORDER — CELECOXIB 200 MG PO CAPS
200.0000 mg | ORAL_CAPSULE | Freq: Once | ORAL | Status: AC
  Administered 2021-07-27: 200 mg via ORAL

## 2021-07-27 MED ORDER — CEFAZOLIN SODIUM-DEXTROSE 2-4 GM/100ML-% IV SOLN
2.0000 g | INTRAVENOUS | Status: AC
  Administered 2021-07-27: 2 g via INTRAVENOUS

## 2021-07-27 MED ORDER — PREGABALIN 75 MG PO CAPS: 150.0000 mg | ORAL_CAPSULE | Freq: Two times a day (BID) | ORAL | Status: DC

## 2021-07-27 MED ORDER — HYDROMORPHONE HCL 1 MG/ML IJ SOLN
1.0000 mg | INTRAMUSCULAR | Status: DC | PRN
  Administered 2021-07-27 – 2021-07-30 (×9): 1 mg via INTRAVENOUS
  Filled 2021-07-27 (×9): qty 1

## 2021-07-27 MED ORDER — CARVEDILOL 25 MG PO TABS: 25.0000 mg | ORAL_TABLET | Freq: Two times a day (BID) | ORAL | Status: DC

## 2021-07-27 MED ORDER — DEXAMETHASONE SODIUM PHOSPHATE 10 MG/ML IJ SOLN: 8.0000 mg | Freq: Once | INTRAMUSCULAR | Status: DC

## 2021-07-27 MED ORDER — ONDANSETRON HCL 4 MG/2ML IJ SOLN
INTRAMUSCULAR | Status: AC
Start: 2021-07-27 — End: ?
  Filled 2021-07-27: qty 2

## 2021-07-27 MED ORDER — EPHEDRINE 5 MG/ML INJ
INTRAVENOUS | Status: AC
  Filled 2021-07-27: qty 5

## 2021-07-27 MED ORDER — AMISULPRIDE (ANTIEMETIC) 5 MG/2ML IV SOLN: 10.0000 mg | Freq: Once | INTRAVENOUS | Status: DC | PRN

## 2021-07-27 MED ORDER — CEFAZOLIN SODIUM-DEXTROSE 2-4 GM/100ML-% IV SOLN
INTRAVENOUS | Status: AC
  Filled 2021-07-27: qty 100

## 2021-07-27 MED ORDER — LACTATED RINGERS IV SOLN: INTRAVENOUS | Status: DC

## 2021-07-27 MED ORDER — IPRATROPIUM-ALBUTEROL 0.5-2.5 (3) MG/3ML IN SOLN: 3.0000 mL | Freq: Four times a day (QID) | RESPIRATORY_TRACT | Status: DC | PRN

## 2021-07-27 MED ORDER — PHENYLEPHRINE 80 MCG/ML (10ML) SYRINGE FOR IV PUSH (FOR BLOOD PRESSURE SUPPORT)
PREFILLED_SYRINGE | INTRAVENOUS | Status: DC | PRN
  Administered 2021-07-27: 160 ug via INTRAVENOUS
  Administered 2021-07-27 (×2): 80 ug via INTRAVENOUS

## 2021-07-27 MED ORDER — TRANEXAMIC ACID-NACL 1000-0.7 MG/100ML-% IV SOLN
INTRAVENOUS | Status: AC
  Filled 2021-07-27: qty 100

## 2021-07-27 MED ORDER — POVIDONE-IODINE 10 % EX SWAB: 2.0000 | Freq: Once | CUTANEOUS | Status: DC

## 2021-07-27 MED ORDER — METOCLOPRAMIDE HCL 5 MG/ML IJ SOLN: 5.0000 mg | Freq: Three times a day (TID) | INTRAMUSCULAR | Status: DC | PRN

## 2021-07-27 MED ORDER — ROSUVASTATIN CALCIUM 20 MG PO TABS: 20.0000 mg | ORAL_TABLET | Freq: Every day | ORAL | Status: DC

## 2021-07-27 MED ORDER — PRONTOSAN WOUND IRRIGATION OPTIME
TOPICAL | Status: DC | PRN
  Administered 2021-07-27: 1 via TOPICAL

## 2021-07-27 MED ORDER — ONDANSETRON HCL 4 MG/2ML IJ SOLN: 4.0000 mg | Freq: Four times a day (QID) | INTRAMUSCULAR | Status: DC | PRN

## 2021-07-27 MED ORDER — TRANEXAMIC ACID 650 MG PO TABS: 1950.0000 mg | ORAL_TABLET | Freq: Two times a day (BID) | ORAL | Status: DC

## 2021-07-27 MED ORDER — FENTANYL CITRATE PF 50 MCG/ML IJ SOSY
PREFILLED_SYRINGE | INTRAMUSCULAR | Status: AC
  Filled 2021-07-27: qty 3

## 2021-07-27 MED ORDER — IPRATROPIUM-ALBUTEROL 0.5-2.5 (3) MG/3ML IN SOLN
3.0000 mL | Freq: Two times a day (BID) | RESPIRATORY_TRACT | Status: DC
Start: 2021-07-27 — End: 2021-07-28
  Filled 2021-07-27: qty 3

## 2021-07-27 MED ORDER — METOCLOPRAMIDE HCL 5 MG PO TABS: 5.0000 mg | ORAL_TABLET | Freq: Three times a day (TID) | ORAL | Status: DC | PRN

## 2021-07-27 MED ORDER — MIDAZOLAM HCL 2 MG/2ML IJ SOLN
INTRAMUSCULAR | Status: AC
  Filled 2021-07-27: qty 2

## 2021-07-27 MED ORDER — DEXAMETHASONE SODIUM PHOSPHATE 10 MG/ML IJ SOLN
INTRAMUSCULAR | Status: DC | PRN
  Administered 2021-07-27: 10 mg via INTRAVENOUS

## 2021-07-27 MED ORDER — MENTHOL 3 MG MT LOZG: 1.0000 | LOZENGE | OROMUCOSAL | Status: DC | PRN

## 2021-07-27 MED ORDER — FERROUS SULFATE 325 (65 FE) MG PO TABS
325.0000 mg | ORAL_TABLET | Freq: Two times a day (BID) | ORAL | Status: DC
  Administered 2021-07-27 – 2021-07-30 (×6): 325 mg via ORAL
  Filled 2021-07-27 (×6): qty 1

## 2021-07-27 MED ORDER — PROPOFOL 10 MG/ML IV BOLUS
INTRAVENOUS | Status: AC
  Filled 2021-07-27: qty 20

## 2021-07-27 MED ORDER — ONDANSETRON HCL 4 MG/2ML IJ SOLN
INTRAMUSCULAR | Status: DC | PRN
  Administered 2021-07-27: 4 mg via INTRAVENOUS

## 2021-07-27 MED ORDER — IPRATROPIUM-ALBUTEROL 20-100 MCG/ACT IN AERS: 1.0000 | INHALATION_SPRAY | Freq: Two times a day (BID) | RESPIRATORY_TRACT | Status: DC | PRN

## 2021-07-27 MED ORDER — 0.9 % SODIUM CHLORIDE (POUR BTL) OPTIME
TOPICAL | Status: DC | PRN
  Administered 2021-07-27: 1000 mL

## 2021-07-27 MED ORDER — POLYETHYLENE GLYCOL 3350 17 G PO PACK
17.0000 g | PACK | Freq: Every day | ORAL | Status: DC | PRN
Start: 2021-07-27 — End: 2021-07-27

## 2021-07-27 MED ORDER — SODIUM CHLORIDE 0.9 % IR SOLN
Status: DC | PRN
  Administered 2021-07-27: 6000 mL

## 2021-07-27 MED ORDER — DOCUSATE SODIUM 100 MG PO CAPS
100.0000 mg | ORAL_CAPSULE | Freq: Two times a day (BID) | ORAL | Status: DC
  Administered 2021-07-27 – 2021-07-30 (×7): 100 mg via ORAL
  Filled 2021-07-27 (×7): qty 1

## 2021-07-27 MED ORDER — SODIUM CHLORIDE 0.9 % IR SOLN: Status: DC | PRN

## 2021-07-27 MED ORDER — METHOCARBAMOL 500 MG PO TABS: 500.0000 mg | ORAL_TABLET | Freq: Four times a day (QID) | ORAL | Status: DC | PRN

## 2021-07-27 MED ORDER — MIDAZOLAM HCL 2 MG/2ML IJ SOLN
INTRAMUSCULAR | Status: DC | PRN
  Administered 2021-07-27: 2 mg via INTRAVENOUS

## 2021-07-27 MED ORDER — ACETAMINOPHEN 500 MG PO TABS: 1000.0000 mg | ORAL_TABLET | Freq: Four times a day (QID) | ORAL | Status: DC

## 2021-07-27 MED ORDER — PHENYLEPHRINE 80 MCG/ML (10ML) SYRINGE FOR IV PUSH (FOR BLOOD PRESSURE SUPPORT)
PREFILLED_SYRINGE | INTRAVENOUS | Status: AC
  Filled 2021-07-27: qty 10

## 2021-07-27 MED ORDER — DEXAMETHASONE SODIUM PHOSPHATE 10 MG/ML IJ SOLN
INTRAMUSCULAR | Status: AC
  Filled 2021-07-27: qty 1

## 2021-07-27 MED ORDER — FENTANYL CITRATE (PF) 100 MCG/2ML IJ SOLN
INTRAMUSCULAR | Status: DC | PRN
Start: 2021-07-27 — End: 2021-07-27
  Administered 2021-07-27: 100 ug via INTRAVENOUS

## 2021-07-27 MED ORDER — DOCUSATE SODIUM 100 MG PO CAPS: 100.0000 mg | ORAL_CAPSULE | Freq: Two times a day (BID) | ORAL | Status: DC

## 2021-07-27 MED ORDER — ALBUTEROL SULFATE (2.5 MG/3ML) 0.083% IN NEBU: 2.5000 mg | INHALATION_SOLUTION | Freq: Four times a day (QID) | RESPIRATORY_TRACT | Status: DC | PRN

## 2021-07-27 MED ORDER — ACETAMINOPHEN 500 MG PO TABS
ORAL_TABLET | ORAL | Status: AC
  Filled 2021-07-27: qty 2

## 2021-07-27 MED ORDER — DAPTOMYCIN IV (FOR PTA / DISCHARGE USE ONLY)
750.0000 mg | INTRAVENOUS | Status: DC
Start: 2021-07-27 — End: 2021-07-27

## 2021-07-27 MED ORDER — PROMETHAZINE HCL 25 MG/ML IJ SOLN: 6.2500 mg | INTRAMUSCULAR | Status: DC | PRN

## 2021-07-27 MED ORDER — BISACODYL 10 MG RE SUPP: 10.0000 mg | Freq: Every day | RECTAL | Status: DC | PRN

## 2021-07-27 MED ORDER — CHLORHEXIDINE GLUCONATE 4 % EX LIQD: 60.0000 mL | Freq: Once | CUTANEOUS | Status: DC

## 2021-07-27 MED ORDER — FENTANYL CITRATE PF 50 MCG/ML IJ SOSY
25.0000 ug | PREFILLED_SYRINGE | INTRAMUSCULAR | Status: DC | PRN
  Administered 2021-07-27 (×3): 50 ug via INTRAVENOUS

## 2021-07-27 MED ORDER — EPHEDRINE SULFATE-NACL 50-0.9 MG/10ML-% IV SOSY
PREFILLED_SYRINGE | INTRAVENOUS | Status: DC | PRN
  Administered 2021-07-27 (×4): 5 mg via INTRAVENOUS

## 2021-07-27 MED ORDER — FENTANYL CITRATE (PF) 100 MCG/2ML IJ SOLN
INTRAMUSCULAR | Status: AC
  Filled 2021-07-27: qty 2

## 2021-07-27 SURGICAL SUPPLY — 41 items
BAG COUNTER SPONGE SURGICOUNT (BAG) IMPLANT
BAG ZIPLOCK 12X15 (MISCELLANEOUS) ×2 IMPLANT
COVER MAYO STAND STRL (DRAPES) ×1 IMPLANT
COVER SURGICAL LIGHT HANDLE (MISCELLANEOUS) ×2 IMPLANT
DERMABOND ADVANCED (GAUZE/BANDAGES/DRESSINGS) ×1
DERMABOND ADVANCED .7 DNX12 (GAUZE/BANDAGES/DRESSINGS) IMPLANT
DRAPE 3/4 80X56 (DRAPES) ×2 IMPLANT
DRAPE POUCH INSTRU U-SHP 10X18 (DRAPES) ×2 IMPLANT
DRAPE U-SHAPE 47X51 STRL (DRAPES) ×2 IMPLANT
DRESSING AQUACEL AG SP 3.5X10 (GAUZE/BANDAGES/DRESSINGS) IMPLANT
DRESSING MEPILEX FLEX 4X4 (GAUZE/BANDAGES/DRESSINGS) IMPLANT
DRSG AQUACEL AG ADV 3.5X10 (GAUZE/BANDAGES/DRESSINGS) ×1 IMPLANT
DRSG AQUACEL AG SP 3.5X10 (GAUZE/BANDAGES/DRESSINGS) ×2
DRSG MEPILEX FLEX 4X4 (GAUZE/BANDAGES/DRESSINGS) ×2
ELECT REM PT RETURN 15FT ADLT (MISCELLANEOUS) ×2 IMPLANT
EVACUATOR 1/8 PVC DRAIN (DRAIN) ×1 IMPLANT
GAUZE SPONGE 4X4 12PLY STRL (GAUZE/BANDAGES/DRESSINGS) ×1 IMPLANT
GAUZE XEROFORM 1X8 LF (GAUZE/BANDAGES/DRESSINGS) ×1 IMPLANT
GLOVE BIO SURGEON STRL SZ 6.5 (GLOVE) ×2 IMPLANT
GLOVE BIOGEL PI IND STRL 6.5 (GLOVE) ×1 IMPLANT
GLOVE BIOGEL PI IND STRL 7.5 (GLOVE) ×3 IMPLANT
GLOVE BIOGEL PI INDICATOR 6.5 (GLOVE) ×2
GLOVE BIOGEL PI INDICATOR 7.5 (GLOVE) ×2
GLOVE BIOGEL PI ORTHO PRO 7.5 (GLOVE) ×1
GLOVE PI ORTHO PRO STRL 7.5 (GLOVE) ×1 IMPLANT
GOWN STRL REUS W/ TWL LRG LVL3 (GOWN DISPOSABLE) ×2 IMPLANT
GOWN STRL REUS W/ TWL XL LVL3 (GOWN DISPOSABLE) IMPLANT
GOWN STRL REUS W/TWL LRG LVL3 (GOWN DISPOSABLE) ×2
GOWN STRL REUS W/TWL XL LVL3 (GOWN DISPOSABLE) ×1
HANDPIECE INTERPULSE COAX TIP (DISPOSABLE) ×1
KIT BASIN OR (CUSTOM PROCEDURE TRAY) ×2 IMPLANT
KIT TURNOVER KIT A (KITS) ×1 IMPLANT
MANIFOLD NEPTUNE II (INSTRUMENTS) ×2 IMPLANT
PACK ORTHO EXTREMITY (CUSTOM PROCEDURE TRAY) ×2 IMPLANT
SET HNDPC FAN SPRY TIP SCT (DISPOSABLE) ×1 IMPLANT
SOL PREP POV-IOD 4OZ 10% (MISCELLANEOUS) ×2 IMPLANT
SPONGE GAUZE 2X2 8PLY STRL LF (GAUZE/BANDAGES/DRESSINGS) ×1 IMPLANT
SUT VIC AB 1 CT1 36 (SUTURE) ×2 IMPLANT
SUT VIC AB 2-0 CT1 27 (SUTURE) ×2
SUT VIC AB 2-0 CT1 TAPERPNT 27 (SUTURE) IMPLANT
TOWEL OR 17X26 10 PK STRL BLUE (TOWEL DISPOSABLE) ×3 IMPLANT

## 2021-07-27 NOTE — Brief Op Note (Signed)
07/25/2021 - 07/27/2021  8:56 AM  PATIENT:  Reginald Walsh  56 y.o. male  PRE-OPERATIVE DIAGNOSIS:  RIGHT HIP HEMATOMA WITH wound dehiscence and persistent bloody DRAINAGE  POST-OPERATIVE DIAGNOSIS:  RIGHT HIP HEMATOMA WITH wound dehiscence and persistent bloody DRAINAGE  PROCEDURE:  Procedure(s): EVACUATION HEMATOMA (Right) INCISIONAL AND NON-INCISIONAL WOUND DEBRIDEMENT AND PRIMARY WOUND CLOSURE (Right)  SURGEON:  Surgeon(s) and Role:    Paralee Cancel, MD - Primary  PHYSICIAN ASSISTANT: Ashley Jacobs, PA-C  ANESTHESIA:   general  EBL:  100 mL   BLOOD ADMINISTERED:none  DRAINS: (1 medium) Hemovact drain(s) in the right hip with  Suction Open   LOCAL MEDICATIONS USED:  NONE  SPECIMEN:  No Specimen  DISPOSITION OF SPECIMEN:  N/A  COUNTS:  YES  TOURNIQUET:  * No tourniquets in log *  DICTATION: .Other Dictation: Dictation Number [id number]  PLAN OF CARE: Admit to inpatient   PATIENT DISPOSITION:  PACU - hemodynamically stable.   Delay start of Pharmacological VTE agent (>24hrs) due to surgical blood loss or risk of bleeding: yes

## 2021-07-27 NOTE — Progress Notes (Signed)
Patient ID: Reginald Walsh, male   DOB: Apr 15, 1965, 56 y.o.   MRN: [id number] Subjective: Day of Surgery Procedure(s) (LRB): EVACUATION HEMATOMA; INCISION AND DRAINAGE WITH WOUND CLOSURE (Right)    Patient reports pain as mild.  Objective:   VITALS:   Vitals:   07/27/21 0646 07/27/21 0650  BP: 113/83   Pulse:  67  Resp:  10  Temp:    SpO2: 90% 100%    Incision: scant drainage  LABS Recent Labs    07/26/21 0333  HGB 9.1*  HCT 28.1*  WBC 4.3  PLT 208    Recent Labs    07/26/21 0333  NA 137  K 4.0  BUN 11  CREATININE 0.74  GLUCOSE 95    No results for input(s): "LABPT", "INR" in the last 72 hours.   Assessment/Plan: Day of Surgery Procedure(s) (LRB): EVACUATION HEMATOMA; INCISION AND DRAINAGE WITH WOUND CLOSURE (Right)   To OR this am for I&D right hip wound evacuation of psot op hematoma NPO

## 2021-07-27 NOTE — Anesthesia Postprocedure Evaluation (Signed)
Anesthesia Post Note  Patient: Reginald Walsh  Procedure(s) Performed: EVACUATION HEMATOMA (Right: Hip) INCISIONAL AND NON-INCISIONAL WOUND DEBRIDEMENT AND PRIMARY WOUND CLOSURE (Right: Hip)     Patient location during evaluation: PACU Anesthesia Type: General Level of consciousness: sedated Pain management: pain level controlled Vital Signs Assessment: post-procedure vital signs reviewed and stable Respiratory status: spontaneous breathing and respiratory function stable Cardiovascular status: stable Postop Assessment: no apparent nausea or vomiting Anesthetic complications: no   No notable events documented.  Last Vitals:  Vitals:   07/27/21 1000 07/27/21 1018  BP: 112/87 (!) 128/95  Pulse: 69 68  Resp: (!) 21 16  Temp:    SpO2: 97%     Last Pain:  Vitals:   07/27/21 1000  TempSrc:   PainSc: Asleep                 Latif Nazareno DANIEL

## 2021-07-27 NOTE — Interval H&P Note (Signed)
History and Physical Interval Note:  07/27/2021 7:38 AM  Reginald Walsh  has presented today for surgery, with the diagnosis of [place].  The various methods of treatment have been discussed with the patient and family. After consideration of risks, benefits and other options for treatment, the patient has consented to  Procedure(s): EVACUATION HEMATOMA; INCISION AND DRAINAGE WITH WOUND CLOSURE (Right) as a surgical intervention.  The patient's history has been reviewed, patient examined, no change in status, stable for surgery.  I have reviewed the patient's chart and labs.  Questions were answered to the patient's satisfaction.     Mauri Pole

## 2021-07-27 NOTE — Plan of Care (Signed)
  Problem: Education: Goal: Knowledge of General Education information will improve Description: Including pain rating scale, medication(s)/side effects and non-pharmacologic comfort measures Outcome: Progressing   Problem: Pain Managment: Goal: General experience of comfort will improve Outcome: Progressing   Problem: Safety: Goal: Ability to remain free from injury will improve Outcome: Progressing   

## 2021-07-27 NOTE — H&P (Signed)
Reginald Walsh is an 56 y.o. male.    Chief Complaint: right hip wound hematoma with persistent serous drainage with 5 mm dehiscence    HPI: Reginald Walsh is a 56 year old male who is status post right total hip arthroplasty approximately 4 months ago.  He initially was doing well and developed late onset acute cellulitic response around his right hip.  This was initially managed and controlled with oral antibiotics however upon cessation of the antibiotics the erythema recurred and there was noted to be a small area of drainage.  He was subsequently taken to the operating room 2 weeks ago and had his right hip opened and debrided down to the joint.  He had been placed on daptomycin and ceftriaxone postoperatively.  At the time of that procedure I elected to close his wound with nylon sutures.  He had unfortunately persistent drainage and incomplete wound healing.  After managing or trying to manage this in the outpatient setting I felt that it was in our best interest at this point to return to the operating room to evacuate the subcutaneous hematoma and primarily close his wound with a subcuticular running Monocryl stitch possibly over a drain.  We reviewed this in the office on Friday, 07/25/2021, the day that I had him admitted to the hospital to have this addressed.  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   40 mm by echo   Chest pain of uncertain etiology    Normal coronaries after abnormal Myoview Feb 2021 Echo March 2021 showed normal LVF- mild LVH    Chest pain of unknown etiology 02/2019   normal coronaries after abnormal Myoview   COPD (chronic obstructive pulmonary disease) ([place])    Dyspnea    Encounter for screening for COVID-19 07/01/2021   Generalized enlarged lymph nodes 07/01/2021   Hypertension    normal RA dopplers    Hyponatremia    Other allergy status, other than to drugs and biological substances 07/01/2021   Other and unspecified complications of medical care, not elsewhere classified 07/01/2021   Other ill-defined and unknown causes of morbidity and mortality 07/01/2021   Overweight    Pain in left knee 07/01/2021   Pain in right foot 07/01/2021    PSH: Past Surgical History:  Procedure Laterality Date   HIP ARTHROPLASTY Left 2021   HIP ARTHROPLASTY Right 2023   INCISION AND DRAINAGE HIP Right 07/08/2021   Procedure: EXCISIONAL AND NON EXCISIONAL DEBRIDEMENT HIP;  Surgeon: Paralee Cancel, MD;  Location: [facility];  Service: Orthopedics;  Laterality: Right;   KNEE ARTHROSCOPY Left 2008   LEFT HEART CATH AND CORONARY ANGIOGRAPHY N/A 03/21/2019   Procedure: LEFT HEART CATH AND CORONARY ANGIOGRAPHY;  Surgeon: Burnell Blanks, MD;  Location: [facility];  Service: Cardiovascular;  Laterality: N/A;   LUMBAR LAMINECTOMY  2000   TENDON RELEASE Right 2018   foot   WISDOM TOOTH EXTRACTION     age 65    Social History:  reports that he quit smoking about 34 years ago. His smoking use included cigarettes. He has a 3.50 pack-year smoking history. His smokeless tobacco use includes snuff and chew. He reports current alcohol use of about 1.0 standard drink of alcohol per week. He reports that he does not use drugs.  Allergies:  Allergies  Allergen Reactions  Escitalopram Oxalate Anxiety    Medications: Medications Prior to Admission  Medication Sig Dispense Refill   acetaminophen (TYLENOL) 325 MG tablet Take 3 tablets (975 mg total) by mouth every 6 (six) hours.     albuterol (VENTOLIN HFA) 108 (90 Base) MCG/ACT inhaler Inhale 2 puffs into the lungs every 6 (six) hours as needed for wheezing or shortness of breath.     augmented betamethasone dipropionate (DIPROLENE-AF) 0.05 % cream Apply 1 application topically 2 (two) times daily as needed (eczema).     carvedilol (COREG) 25 MG  tablet Take 1 tablet (25 mg total) by mouth 2 (two) times daily. 180 tablet 3   cefTRIAXone (ROCEPHIN) IVPB Inject 2 g into the vein daily. Indication:  R hip prosthetic joint infection First Dose: Yes Last Day of Therapy:  08/18/2021 Labs - Once weekly:  CBC/D and BMP, Labs - Every other week:  ESR and CRP Method of administration: IV Push Method of administration may be changed at the discretion of home infusion pharmacist based upon assessment of the patient and/or caregiver's ability to self-administer the medication ordered. 39 Units 0   daptomycin (CUBICIN) IVPB Inject 750 mg into the vein daily. Indication:  Right hip PJI First Dose: Yes Last Day of Therapy:  08/18/21 Labs - Once weekly:  CBC/D, BMP, and CPK Labs - Every other week:  ESR and CRP Method of administration: IV Push Method of administration may be changed at the discretion of home infusion pharmacist based upon assessment of the patient and/or caregiver's ability to self-administer the medication ordered. 39 Units 0   DULoxetine (CYMBALTA) 60 MG capsule Take 60 mg by mouth daily.     fluticasone (FLONASE) 50 MCG/ACT nasal spray Place 2 sprays into both nostrils in the morning and at bedtime. 18.2 mL 5   Ipratropium-Albuterol (COMBIVENT) 20-100 MCG/ACT AERS respimat Inhale 1 puff into the lungs in the morning and at bedtime.     methocarbamol (ROBAXIN) 500 MG tablet Take 1 tablet (500 mg total) by mouth every 6 (six) hours as needed for muscle spasms. 40 tablet 0   Multiple Vitamins-Minerals (MENS MULTIVITAMIN PLUS PO) Take 1 tablet by mouth daily.      oxyCODONE (OXY IR/ROXICODONE) 5 MG immediate release tablet Take 1-2 tablets (5-10 mg total) by mouth every 4 (four) hours as needed for moderate pain or severe pain (pain score 4-6). 42 tablet 0   pantoprazole (PROTONIX) 40 MG tablet Take 40 mg by mouth daily.     polyethylene glycol (MIRALAX / GLYCOLAX) 17 g packet Take 17 g by mouth daily as needed.     pregabalin (LYRICA)  150 MG capsule Take 150 mg by mouth 2 (two) times daily.      REFRESH TEARS 0.5 % SOLN Place 1 drop into both eyes 3 (three) times daily as needed (dry eyes).     rosuvastatin (CRESTOR) 20 MG tablet Take 1 tablet (20 mg total) by mouth daily. 90 tablet 3   tranexamic acid (LYSTEDA) 650 MG TABS tablet Take 1,950 mg by mouth 2 (two) times daily.     aspirin 81 MG chewable tablet Chew 1 tablet (81 mg total) by mouth 2 (two) times daily for 28 days. 56 tablet 0   docusate sodium (COLACE) 100 MG capsule Take 1 capsule (100 mg total) by mouth 2 (two) times daily. 10 capsule 0   ferrous sulfate 325 (65 FE) MG tablet Take 1 tablet (325 mg total) by mouth 3 (three) times daily after meals for  14 days. 42 tablet 0   polyethylene glycol (MIRALAX / GLYCOLAX) 17 g packet Take 17 g by mouth daily as needed for mild constipation. 14 each 0    Results for orders placed or performed during the hospital encounter of 07/25/21 (from the past 48 hour(s))  CBC     Status: Abnormal   Collection Time: 07/26/21  3:33 AM  Result Value Ref Range   WBC 4.3 4.0 - 10.5 K/uL   RBC 3.06 (L) 4.22 - 5.81 MIL/uL   Hemoglobin 9.1 (L) 13.0 - 17.0 g/dL   HCT 28.1 (L) 39.0 - 52.0 %   MCV 91.8 80.0 - 100.0 fL   MCH 29.7 26.0 - 34.0 pg   MCHC 32.4 30.0 - 36.0 g/dL   RDW 16.0 (H) 11.5 - 15.5 %   Platelets 208 150 - 400 K/uL   nRBC 0.0 0.0 - 0.2 %    Comment: Performed at [facility], [place] [address] [place], [place] [address]  Basic metabolic panel     Status: None   Collection Time: 07/26/21  3:33 AM  Result Value Ref Range   Sodium 137 135 - 145 mmol/L   Potassium 4.0 3.5 - 5.1 mmol/L   Chloride 105 98 - 111 mmol/L   CO2 26 22 - 32 mmol/L   Glucose, Bld 95 70 - 99 mg/dL    Comment: Glucose reference range applies only to samples taken after fasting for at least 8 hours.   BUN 11 6 - 20 mg/dL   Creatinine, Ser 0.74 0.61 - 1.24 mg/dL   Calcium 9.0 8.9 - 10.3 mg/dL   GFR, Estimated >60 >60 mL/min     Comment: (NOTE) Calculated using the CKD-EPI Creatinine Equation (2021)    Anion gap 6 5 - 15    Comment: Performed at [facility], [place] [address], [place], [place] [address]  Surgical pcr screen     Status: None   Collection Time: 07/26/21  5:28 PM   Specimen: Nasal Mucosa; Nasal Swab  Result Value Ref Range   MRSA, PCR NEGATIVE NEGATIVE   Staphylococcus aureus NEGATIVE NEGATIVE    Comment: (NOTE) The Xpert SA Assay (FDA approved for NASAL specimens in patients 60 years of age and older), is one component of a comprehensive surveillance program. It is not intended to diagnose infection nor to guide or monitor treatment. Performed at [facility], [place] [address] [place], [place] [address]    No results found.  ROS: Review of Systems - Negative except that included in the HPI  Physican Exam: Blood pressure 117/71, pulse 68, temperature 98.6 F (37 C), resp. rate 10, height _0  (1.905 m), weight 113.4 kg, SpO2 100 %. Awake alert and oriented In no acute distress No labored breathing Regular rate and rhythm  Right hip girdle exam: On the proximal aspect of his incision there is about a 5 mm area of wound he has since with old serous, old blood drainage with underlying hematoma visible There is a small area of the distal aspect incision where there is further serous drainage. Some evidence of reactive erythema to tape consistent with a contact dermatitis around the incision Generalized fullness and swelling to the right thigh No lower extremity edema, erythema or calf tenderness  Assessment/Plan Assessment: Status post excisional and nonexcisional debridement of the right hip following arthroplasty with persistent wound drainage and incomplete healing with underlying subcutaneous hematoma  Plan: As noted Reginald Walsh was admitted on  Friday with plans to bring him to the operating room to evacuate the subcutaneous hematoma have a chance to  read wash out the hip and performed a closure possibly over a drain. Postoperatively we will plan on him trying to be discharged home on Monday No change in his activity level He will remain on IV antibiotics with the possibility of asking infectious disease to consider restarting the date of utilization from the day of this procedure to get full effect of management of his wound. Postop pain control.   Pietro Cassis Alvan Dame, MD  07/27/2021, 7:30 AM

## 2021-07-27 NOTE — Transfer of Care (Signed)
Immediate Anesthesia Transfer of Care Note  Patient: Reginald Walsh  Procedure(s) Performed: EVACUATION HEMATOMA (Right: Hip) INCISIONAL AND NON-INCISIONAL WOUND DEBRIDEMENT AND PRIMARY WOUND CLOSURE (Right: Hip)  Patient Location: PACU  Anesthesia Type:General  Level of Consciousness: drowsy  Airway & Oxygen Therapy: Patient Spontanous Breathing and Patient connected to face mask oxygen  Post-op Assessment: Report given to RN and Post -op Vital signs reviewed and stable  Post vital signs: Reviewed and stable  Last Vitals:  Vitals Value Taken Time  BP 124/85 07/27/21 0915  Temp    Pulse 71 07/27/21 0915  Resp 8 07/27/21 0915  SpO2 100 % 07/27/21 0915  Vitals shown include unvalidated device data.  Last Pain:  Vitals:   07/27/21 0551  TempSrc: Oral  PainSc:       Patients Stated Pain Goal: 2 (67/12/45 8099)  Complications: No notable events documented.

## 2021-07-27 NOTE — Anesthesia Procedure Notes (Signed)
Procedure Name: LMA Insertion Date/Time: 07/27/2021 7:51 AM  Performed by: Raenette Rover, CRNAPre-anesthesia Checklist: Patient identified, Emergency Drugs available, Suction available and Patient being monitored Patient Re-evaluated:Patient Re-evaluated prior to induction Oxygen Delivery Method: Circle system utilized Preoxygenation: Pre-oxygenation with 100% oxygen Induction Type: IV induction Ventilation: Mask ventilation without difficulty LMA: LMA inserted LMA Size: 4.0 Number of attempts: 1 Placement Confirmation: positive ETCO2 and breath sounds checked- equal and bilateral Tube secured with: Tape Dental Injury: Teeth and Oropharynx as per pre-operative assessment

## 2021-07-27 NOTE — Op Note (Signed)
Reginald Walsh, Reginald Walsh MEDICAL RECORD NO: [id number] ACCOUNT NO: [id number] DATE OF BIRTH: 01-Oct-1965 FACILITY: [facility]ATION: [facility] PHYSICIAN: Pietro Cassis. Alvan Dame, MD  Operative Report   DATE OF PROCEDURE: 07/27/2021  PREOPERATIVE DIAGNOSIS:  Right hip postoperative hematoma with wound dehiscence and persistent bloody drainage.  POSTOPERATIVE DIAGNOSIS:  Right hip postoperative hematoma with wound dehiscence and persistent bloody drainage.  PROCEDURE:   1.  Excisional and non-excisional debridement of right hip wound.  Excisional debridement was carried out sharply with a scalpel and Metzenbaum scissors including skin nonviable subcutaneous tissue and fascia. 2.  Evacuation of right hip hematoma. 3.  Non-excisional debridement with 6 liters of normal saline solution as well as antimicrobial Prontosan solution. 4. Primary wound closure. 5.  Placement of Hemovac drain.  SURGEON:  Pietro Cassis. Alvan Dame, MD  ASSISTANT:  Theresa Duty, PA-C.  Note that Ms. Edmisten was present for the entirety of the case from preoperative positioning, perioperative management of the operative extremity, general facilitation of the case and primary wound closure.  ANESTHESIA:  General.  BLOOD LOSS:  Less than 200 mL  DRAINS:  One medium Hemovac drain was placed deep into the wound as well as placed in the superficial layer.  INDICATIONS FOR THE PROCEDURE:  The patient is a pleasant 56 year old male with history of primary right total hip arthroplasty.  During his postoperative period, he had been doing very well and then developed a significant cellulitic response around his  right hip.  This was initially treated with oral antibiotics with resolution of the cellulitic response; however, he had recurrence once the antibiotics were stopped.  At the second occurrence of his recurrence of the cellulitic changes he had  superficial wound drainage.  He was then taken to the operating room for an I and D of his right  hip with excisional and non-excisional of wound down to the joint with irrigation and debridement about 2 weeks ago.  At the time of that closure I elected  using nylon sutures.  He had persistent drainage.  This was addressed in the office with reapplication of a couple other sutures as well as further Dermabond.  He had persistent drainage.  His most recent visit on Friday 07/25/2021, we recognized that  there was some about 4-5 mm of wound dehiscence proximally with persistent drainage.  At this point, I was concerned about a subcutaneous and deep hematoma that could be putting pressure on his wound preventing this from healing.  I admitted him to the  hospital with plans to take him to the operating room and perform above procedures.  Rationale and indication of the procedure were discussed.  The risk of persistent bloody drainage and need for future surgeries were discussed and reviewed.  Consent was  obtained for benefit of pain relief.  DESCRIPTION OF PROCEDURE:  The patient was brought to the operative theater.  Once adequate anesthesia, preoperative antibiotics, Ancef in addition to his already scheduled daptomycin and ceftriaxone,  he was positioned supine.  His right hip area was  predraped, then prepped and draped using Betadine scrub and paint.  A timeout was performed identifying the patient, planned procedure, and extremity.  I opened up his incision. Deep in the wound we found no signs of recurrence of infection or purulence.   Rather, we found a large hematoma that was obviously superficial, but also had come deep through his fascia.  I thus at this point, evacuated all the hematoma from deep and superficial.  We performed a sharp  excisional debridement as noted with a scalpel and the  Metzenbaum scissors at the wound as well as deep into the nonviable tissue around the hematoma as well as fascia.  I then irrigated the wound with 6 liters of normal saline solution.  We then used the  Prontosan antimicrobial solution.  Once this was  done, I reevaluated his wound.  I elected based on the significant punctate oozing from areas to place a medium Hemovac drain.  I did place as deep but then left some of it outside the fashion of the superficial layer.  I then reapproximated the fascia  using #1 Vicryl in interrupted horizontal mattress fashion.  The drain was not sewn in and was mobile at the time of closure of this layer.  We then closed the subcutaneous layer using 2-0 Vicryl.  The skin on the proximal aspect of the incision was  noted to be somewhat scarred in.  However, we were able to get the edges together without significant tension.  The remainder of the wound was closed with 3-0 Monocryl.  The wound was then closed with clean, dry and dressed sterilely using surgical glue  and Aquacel dressing.  The drain site was dressed separately.  Postoperatively, he will be weightbearing as tolerated.  Activity as per minimal.  We will most likely try to restart his antibiotic dosing based on today's procedure, we will hold his anticoagulation based on his persistent bleeding.  In fact, we will  use tranexamic acid on a daily basis in an effort to try to minimize this oozing.  Hopeful in anticipate discharge on Monday, 07/28/2021.   SHY D: 07/27/2021 9:04:55 am T: 07/27/2021 11:51:00 am  JOB: [id number]/ [id number]

## 2021-07-28 ENCOUNTER — Encounter (HOSPITAL_COMMUNITY): Payer: Self-pay | Admitting: Orthopedic Surgery

## 2021-07-28 LAB — BASIC METABOLIC PANEL
Anion gap: 9 (ref 5–15)
BUN: 13 mg/dL (ref 6–20)
CO2: 23 mmol/L (ref 22–32)
Calcium: 8.8 mg/dL — ABNORMAL LOW (ref 8.9–10.3)
Chloride: 103 mmol/L (ref 98–111)
Creatinine, Ser: 0.78 mg/dL (ref 0.61–1.24)
GFR, Estimated: >60 mL/min (ref >60)
Glucose, Bld: 119 mg/dL — ABNORMAL HIGH (ref 70–99)
Potassium: 4.2 mmol/L (ref 3.5–5.1)
Sodium: 135 mmol/L (ref 135–145)

## 2021-07-28 LAB — CBC
HCT: 28 % — ABNORMAL LOW (ref 39.0–52.0)
Hemoglobin: 8.7 g/dL — ABNORMAL LOW (ref 13.0–17.0)
MCH: 29.2 pg (ref 26.0–34.0)
MCHC: 31.1 g/dL (ref 30.0–36.0)
MCV: 94 fL (ref 80.0–100.0)
Platelets: 228 10*3/uL (ref 150–400)
RBC: 2.98 MIL/uL — ABNORMAL LOW (ref 4.22–5.81)
RDW: 15.8 % — ABNORMAL HIGH (ref 11.5–15.5)
WBC: 4.4 10*3/uL (ref 4.0–10.5)
nRBC: 0 % (ref 0.0–0.2)

## 2021-07-28 LAB — CK: Total CK: 58 U/L (ref 49–397)

## 2021-07-28 MED ORDER — TRANEXAMIC ACID 650 MG PO TABS
1950.0000 mg | ORAL_TABLET | Freq: Two times a day (BID) | ORAL | Status: DC
  Administered 2021-07-28 – 2021-07-30 (×4): 1950 mg via ORAL
  Filled 2021-07-28 (×4): qty 3

## 2021-07-28 MED ORDER — DAPTOMYCIN IV (FOR PTA / DISCHARGE USE ONLY): 750.0000 mg | INTRAVENOUS | 0 refills | Status: AC

## 2021-07-28 MED ORDER — CEFTRIAXONE IV (FOR PTA / DISCHARGE USE ONLY): 2.0000 g | INTRAVENOUS | 0 refills | Status: AC

## 2021-07-28 NOTE — Progress Notes (Signed)
   Subjective: 1 Day Post-Op Procedure(s) (LRB): EVACUATION HEMATOMA (Right) INCISIONAL AND NON-INCISIONAL WOUND DEBRIDEMENT AND PRIMARY WOUND CLOSURE (Right) Patient reports pain as mild.   Patient seen in rounds by Dr. Alvan Dame. Patient is resting in bed on exam this morning. No acute events overnight.  We will start therapy today.   Objective: Vital signs in last 24 hours: Temp:  [96.4 F (35.8 C)-98 F (36.7 C)] 97.8 F (36.6 C) (07/03 0546) Pulse Rate:  [68-90] 84 (07/03 0546) Resp:  [10-21] 18 (07/03 0546) BP: (107-136)/(71-95) 128/77 (07/03 0546) SpO2:  [92 %-100 %] 95 % (07/03 0546)  Intake/Output from previous day:  Intake/Output Summary (Last 24 hours) at 07/28/2021 0728 Last data filed at 07/28/2021 0546 Gross per 24 hour  Intake 3145.02 ml  Output 5050 ml  Net -1904.98 ml     Intake/Output this shift: No intake/output data recorded.  Labs: Recent Labs    07/26/21 0333 07/28/21 0330  HGB 9.1* 8.7*   Recent Labs    07/26/21 0333 07/28/21 0330  WBC 4.3 4.4  RBC 3.06* 2.98*  HCT 28.1* 28.0*  PLT 208 228   Recent Labs    07/26/21 0333 07/28/21 0330  NA 137 135  K 4.0 4.2  CL 105 103  CO2 26 23  BUN 11 13  CREATININE 0.74 0.78  GLUCOSE 95 119*  CALCIUM 9.0 8.8*   No results for input(s): "LABPT", "INR" in the last 72 hours.  Exam: General - Patient is Alert and Oriented Extremity - Neurologically intact Sensation intact distally Intact pulses distally Dorsiflexion/Plantar flexion intact Dressing - dressing C/D/I, hemovac drain in place Motor Function - intact, moving foot and toes well on exam.   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   40 mm by echo   Chest pain of uncertain etiology    Normal coronaries after abnormal Myoview Feb 2021 Echo March 2021 showed normal LVF- mild LVH    Chest pain of unknown etiology 02/2019   normal coronaries after abnormal Myoview   COPD  (chronic obstructive pulmonary disease) ([place])    Dyspnea    Encounter for screening for COVID-19 07/01/2021   Generalized enlarged lymph nodes 07/01/2021   Hypertension    normal RA dopplers   Hyponatremia    Other allergy status, other than to drugs and biological substances 07/01/2021   Other and unspecified complications of medical care, not elsewhere classified 07/01/2021   Other ill-defined and unknown causes of morbidity and mortality 07/01/2021   Overweight    Pain in left knee 07/01/2021   Pain in right foot 07/01/2021    Assessment/Plan: 1 Day Post-Op Procedure(s) (LRB): EVACUATION HEMATOMA (Right) INCISIONAL AND NON-INCISIONAL WOUND DEBRIDEMENT AND PRIMARY WOUND CLOSURE (Right) Principal Problem:   Right hip pain  Estimated body mass index is 31.25 kg/m as calculated from the following:   Height as of this encounter: '6\' 3"'$  (1.905 m).   Weight as of this encounter: 113.4 kg. Advance diet Up with therapy  DVT Prophylaxis - Aspirin Weight bearing as tolerated.   Hgb stable at 8.7 this AM.  Keep drain in place today. Will likely keep patient in hospital for next several days to monitor wound and hgb.   Griffith Citron, PA-C Orthopedic Surgery [phone] 07/28/2021, 7:28 AM

## 2021-07-28 NOTE — Progress Notes (Signed)
Chaplain engaged in an initial visit with Reginald Walsh and his wife.  Chaplain was able to spend time getting to know them, learn about their faith, and hear about [name] healthcare journey.  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  Reginald Walsh is well versed in [place] care that Reginald Walsh needs and desires. Reginald Walsh and Reg[place]d Walsh have been married for about 6 years, meeting through family.  Reginald Walsh is looking forward to being able to retire in [place] next couple of years.  Reginald Walsh are believers in God and are thankful to be apart of a diverse church with many different cultures, age groups, and functions.   Chaplain provided space for Reginald Walsh to share candidly.  Reginald Walsh voiced being well taken care of on this particular stay in [place] hospital but voiced that his previous stay was terrible and hard.  Reginald Walsh voiced that Reginald Walsh could have used a Chaplain then.  Chaplain assesses that [name] life experiences in [place] and in [place] medical field are grounding him through his own health crises, but Chaplain also could see that Reginald Walsh wants to get well and stay out of [place] hospital.  Reginald Walsh is working to remain hopeful.  Chaplain engaged in prayer with Reginald Wik'  uplifting what Reginald Walsh desires for [place] future.   07/28/21 1100  Clinical Encounter Type  Visited With Patient and family together  Visit Type Initial;Spiritual support  Referral From Patient;Nurse  Consult/Referral To Chaplain  Spiritual Encounters  Spiritual Needs Prayer  Stress Factors  Patient Stress Factors Health changes

## 2021-07-28 NOTE — Progress Notes (Signed)
PHARMACY CONSULT NOTE FOR:  OUTPATIENT  PARENTERAL ANTIBIOTIC THERAPY (OPAT)  Indication: R-hip PJI  Regimen: Daptomycin 750 mg IV every 24 hours and Rocephin 2g IV every 24 hours End date: 09/07/21  IV antibiotic discharge orders are pended. To discharging provider:  please sign these orders via discharge navigator,  Select New Orders & click on the button choice - Manage This Unsigned Work.    Thank you for allowing pharmacy to be a part of this patient's care.  Alycia Rossetti, PharmD, BCPS Infectious Diseases Clinical Pharmacist 07/28/2021 8:12 AM   **Pharmacist phone directory can now be found on [email or web address] ([email or web address]).  Listed under [place].

## 2021-07-28 NOTE — Plan of Care (Signed)
  Problem: Pain Managment: Goal: General experience of comfort will improve Outcome: Progressing   Problem: Coping: Goal: Level of anxiety will decrease Outcome: Progressing   

## 2021-07-29 LAB — CBC
HCT: 27.4 % — ABNORMAL LOW (ref 39.0–52.0)
Hemoglobin: 8.6 g/dL — ABNORMAL LOW (ref 13.0–17.0)
MCH: 29.5 pg (ref 26.0–34.0)
MCHC: 31.4 g/dL (ref 30.0–36.0)
MCV: 93.8 fL (ref 80.0–100.0)
Platelets: 236 10*3/uL (ref 150–400)
RBC: 2.92 MIL/uL — ABNORMAL LOW (ref 4.22–5.81)
RDW: 16.5 % — ABNORMAL HIGH (ref 11.5–15.5)
WBC: 5.1 10*3/uL (ref 4.0–10.5)
nRBC: 0 % (ref 0.0–0.2)

## 2021-07-29 NOTE — Progress Notes (Signed)
   Subjective: 2 Days Post-Op Procedure(s) (LRB): EVACUATION HEMATOMA (Right) INCISIONAL AND NON-INCISIONAL WOUND DEBRIDEMENT AND PRIMARY WOUND CLOSURE (Right) Patient reports pain as mild.   Patient seen in rounds for Dr. Alvan Dame. Patient is resting in bed on exam this morning. No acute events overnight. Hemovac in place.  We will start therapy today.   Objective: Vital signs in last 24 hours: Temp:  [97.6 F (36.4 C)-98.2 F (36.8 C)] 97.9 F (36.6 C) (07/04 0514) Pulse Rate:  [70-85] 70 (07/04 0514) Resp:  [17-18] 18 (07/04 0514) BP: (118-131)/(65-86) 131/86 (07/04 0514) SpO2:  [93 %-98 %] 95 % (07/04 0514)  Intake/Output from previous day:  Intake/Output Summary (Last 24 hours) at 07/29/2021 0845 Last data filed at 07/29/2021 0600 Gross per 24 hour  Intake 1781.25 ml  Output 3165 ml  Net -1383.75 ml     Intake/Output this shift: No intake/output data recorded.  Labs: Recent Labs    07/28/21 0330 07/29/21 0356  HGB 8.7* 8.6*   Recent Labs    07/28/21 0330 07/29/21 0356  WBC 4.4 5.1  RBC 2.98* 2.92*  HCT 28.0* 27.4*  PLT 228 236   Recent Labs    07/28/21 0330  NA 135  K 4.2  CL 103  CO2 23  BUN 13  CREATININE 0.78  GLUCOSE 119*  CALCIUM 8.8*   No results for input(s): "LABPT", "INR" in the last 72 hours.  Exam: General - Patient is Alert and Oriented Extremity - Neurologically intact Sensation intact distally Intact pulses distally Dorsiflexion/Plantar flexion intact Dressing - dressing C/D/I. Aquacel clean and dry with hemovac drain in place.  Motor Function - intact, moving foot and toes well on exam.   Past Medical History:  Diagnosis Date   Abnormal stress test    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[place]) 03/2019   40 mm by echo   Chest pain of uncertain etiology    Normal coronaries after abnormal Myoview Feb 2021 Echo March 2021 showed normal LVF- mild LVH    Chest pain of unknown etiology 02/2019   normal coronaries after abnormal Myoview    COPD (chronic obstructive pulmonary disease) ([place])    Dyspnea    Encounter for screening for COVID-19 07/01/2021   Generalized enlarged lymph nodes 07/01/2021   Hypertension    normal RA dopplers   Hyponatremia    Other allergy status, other than to drugs and biological substances 07/01/2021   Other and unspecified complications of medical care, not elsewhere classified 07/01/2021   Other ill-defined and unknown causes of morbidity and mortality 07/01/2021   Overweight    Pain in left knee 07/01/2021   Pain in right foot 07/01/2021    Assessment/Plan: 2 Days Post-Op Procedure(s) (LRB): EVACUATION HEMATOMA (Right) INCISIONAL AND NON-INCISIONAL WOUND DEBRIDEMENT AND PRIMARY WOUND CLOSURE (Right) Principal Problem:   Right hip pain  Estimated body mass index is 31.25 kg/m as calculated from the following:   Height as of this encounter: '6\' 3"'$  (1.905 m).   Weight as of this encounter: 113.4 kg. Advance diet Up with therapy  DVT Prophylaxis - Aspirin Weight bearing as tolerated.  Hgb stable at 8.7 this AM.  Plan is to go Home after hospital stay. Will stay inpatient until drain is removed and we can ensure appropriate wound management for home.   Griffith Citron, PA-C Orthopedic Surgery [phone] 07/29/2021, 8:45 AM

## 2021-07-29 NOTE — Progress Notes (Signed)
Hemovac accidentally came out when pt was up to BR. Message sent to PA.

## 2021-07-30 MED ORDER — TRANEXAMIC ACID 650 MG PO TABS: 1950.0000 mg | ORAL_TABLET | Freq: Two times a day (BID) | ORAL | 0 refills | Status: AC

## 2021-07-30 MED ORDER — HEPARIN SOD (PORK) LOCK FLUSH 100 UNIT/ML IV SOLN
250.0000 [IU] | INTRAVENOUS | Status: AC | PRN
  Administered 2021-07-30: 250 [IU]
  Filled 2021-07-30: qty 3

## 2021-07-30 MED ORDER — HYDROMORPHONE HCL 1 MG/ML IJ SOLN
0.5000 mg | INTRAMUSCULAR | Status: DC | PRN
  Administered 2021-07-30: 0.5 mg via INTRAVENOUS
  Filled 2021-07-30: qty 0.5

## 2021-07-30 MED ORDER — HYDROCODONE-ACETAMINOPHEN 10-325 MG PO TABS: 1.0000 | ORAL_TABLET | ORAL | 0 refills | Status: DC | PRN

## 2021-07-30 NOTE — TOC Transition Note (Signed)
Transition of Care [facility]) - CM/SW Discharge Note   Patient Details  Name: Reginald Walsh MRN: [id number] Date of Birth: 1965/06/02  Transition of Care (TOC) CM/SW Contact:  Lennart Pall, LCSW Phone Number: 07/30/2021, 10:38 AM   Clinical Narrative:     Met briefly with pt and confirming plan for dc home today with resumption of HHRN and IV abx via [place] and [place].  Have confirmed with Carolynn Sayers, RN with [facility] that plans for resumption are all good.  No TOC needs.  Final next level of care: [place] Barriers to Discharge: Barriers Resolved   Patient Goals and CMS Choice Patient states their goals for this hospitalization and ongoing recovery are:: return home      Discharge Placement                       Discharge Plan and Services                          HH Arranged: RN, IV Antibiotics HH Agency: [facility], [place]        Social Determinants of Health (SDOH) Interventions     Readmission Risk Interventions    07/30/2021   10:37 AM 07/09/2021    1:00 PM  Readmission Risk Prevention Plan  Post Dischage Appt  Complete  Medication Screening  Complete  Transportation Screening Complete Complete  PCP or Specialist Appt within 5-7 Days Complete   Home Care Screening Complete   Medication Review (RN CM) Complete

## 2021-07-30 NOTE — Progress Notes (Signed)
Patient discharged, IV team came to cap off/prepare PICC for discharge. Patient and wife verbalized understanding of discharge/PICC instructions. No questions/concerns from patient/family.

## 2021-07-30 NOTE — Discharge Summary (Signed)
Physician Discharge Summary   Patient ID: Reginald Walsh MRN: [id number] DOB/AGE: [birth year] 56 y.o.  Admit date: 07/08/2021 Discharge date: 07/14/2021  Primary Diagnosis: History of right total hip arthroplasty with infection.  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   40 mm by echo   Chest pain of uncertain etiology    Normal coronaries after abnormal Myoview Feb 2021 Echo March 2021 showed normal LVF- mild LVH    Chest pain of unknown etiology 02/2019   normal coronaries after abnormal Myoview   COPD (chronic obstructive pulmonary disease) ([place])    Dyspnea    Encounter for screening for COVID-19 07/01/2021   Generalized enlarged lymph nodes 07/01/2021   Hypertension    normal RA dopplers   Hyponatremia    Other allergy status, other than to drugs and biological substances 07/01/2021   Other and unspecified complications of medical care, not elsewhere classified 07/01/2021   Other ill-defined and unknown causes of morbidity and mortality 07/01/2021   Overweight    Pain in left knee 07/01/2021   Pain in right foot 07/01/2021   Discharge Diagnoses:   Principal Problem:   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   Weight as of this encounter: 114.8 kg.  Procedure:  Procedure(s) (LRB): EXCISIONAL AND NON EXCISIONAL DEBRIDEMENT HIP (Right)   Consults: ID  HPI: The patient is a 56 year old male who is about 3 months out from his right total hip replacement.  His postoperative  course was as follows.  Initially at his 2-week visit, he was doing very well.  He had a history of left  total hip replacement and was progressing in similar fashion.  There was no evidence of any wound concerns at dressing change.  He presented shortly thereafter with significant right  hip erythematous change consistent with a reactive cellulitic response.   He was treated with cefadroxil and responded this very well.  The redness cleared. After a course of the antibiotics, it was maintained for a total of 4 weeks approximately.  He stopped the antibiotics and within a few days of that and the noted  improvement, he noted increased erythema and some swelling in the distal aspect of his incision. Given these findings, I felt that at this point, we had failed attempts at conservative treatment and that what was once an obvious cellulitic response had  now become more of a wound involved issue.  I recommended that we go to the operating room and to evaluate and manage the infection.  We discussed the postoperative course including the use of IV antibiotics for 6 weeks with the hope of joint salvage.   Consent was obtained for management of the infection.  Laboratory Data: Admission on 07/08/2021, Discharged on 07/14/2021  Component Date Value Ref Range Status   Specimen Description 07/08/2021    Final                   Value:WOUND Performed at [facility], [place] [address], [place], [place] [address]    Special Requests 07/08/2021 RIGHT HIP   Final   Gram Stain 07/08/2021    Final                   Value:FEW WBC PRESENT,BOTH PMN AND MONONUCLEAR  NO ORGANISMS SEEN    Culture 07/08/2021    Final                   Value:No growth aerobically or anaerobically. Performed at [facility], [place] [address], [place], [place] [address]    Report Status 07/08/2021 07/13/2021 FINAL   Final   Sodium 07/09/2021 133 (L)  135 - 145 mmol/L Final   Potassium 07/09/2021 4.8  3.5 - 5.1 mmol/L Final   Chloride 07/09/2021 98  98 - 111 mmol/L Final   CO2 07/09/2021 26  22 - 32 mmol/L Final   Glucose, Bld 07/09/2021 191 (H)  70 - 99 mg/dL Final   Glucose reference range applies only to samples taken after fasting for at least 8 hours.   BUN 07/09/2021 10  6 - 20 mg/dL Final    Creatinine, Ser 07/09/2021 0.85  0.61 - 1.24 mg/dL Final   Calcium 07/09/2021 8.6 (L)  8.9 - 10.3 mg/dL Final   GFR, Estimated 07/09/2021 >60  >60 mL/min Final   Comment: (NOTE) Calculated using the CKD-EPI Creatinine Equation (2021)    Anion gap 07/09/2021 9  5 - 15 Final   Performed at [facility], [place] [address], [place], [place] [address]   WBC 07/09/2021 6.2  4.0 - 10.5 K/uL Final   RBC 07/09/2021 3.03 (L)  4.22 - 5.81 MIL/uL Final   Hemoglobin 07/09/2021 8.8 (L)  13.0 - 17.0 g/dL Final   HCT 07/09/2021 26.9 (L)  39.0 - 52.0 % Final   MCV 07/09/2021 88.8  80.0 - 100.0 fL Final   MCH 07/09/2021 29.0  26.0 - 34.0 pg Final   MCHC 07/09/2021 32.7  30.0 - 36.0 g/dL Final   RDW 07/09/2021 14.6  11.5 - 15.5 % Final   Platelets 07/09/2021 279  150 - 400 K/uL Final   nRBC 07/09/2021 0.0  0.0 - 0.2 % Final   Performed at [facility], [place] [address], [place], [place] [address]   WBC 07/10/2021 8.2  4.0 - 10.5 K/uL Final   RBC 07/10/2021 2.56 (L)  4.22 - 5.81 MIL/uL Final   Hemoglobin 07/10/2021 7.4 (L)  13.0 - 17.0 g/dL Final   HCT 07/10/2021 22.8 (L)  39.0 - 52.0 % Final   MCV 07/10/2021 89.1  80.0 - 100.0 fL Final   MCH 07/10/2021 28.9  26.0 - 34.0 pg Final   MCHC 07/10/2021 32.5  30.0 - 36.0 g/dL Final   RDW 07/10/2021 15.3  11.5 - 15.5 % Final   Platelets 07/10/2021 312  150 - 400 K/uL Final   nRBC 07/10/2021 0.0  0.0 - 0.2 % Final   Performed at [facility], [place] [address] [place], [place] [address]   Total CK 07/10/2021 136  49 - 397 U/L Final   Performed at [facility], [place] [address] [place], [place] [address]   WBC 07/11/2021 7.1  4.0 - 10.5 K/uL Final   RBC 07/11/2021 2.38 (L)  4.22 - 5.81 MIL/uL Final   Hemoglobin 07/11/2021 6.9 (LL)  13.0 - 17.0 g/dL Final   This critical result has verified and been called to [facility] by VAZQUEZ,JACKIE on 06 16 2023 at 0457, and has been read back. CRITICAL  RESULTS CALLED   HCT 07/11/2021 21.6 (L)  39.0 - 52.0 % Final   MCV 07/11/2021 90.8  80.0 - 100.0 fL Final   MCH 07/11/2021 29.0  26.0 - 34.0 pg Final  MCHC 07/11/2021 31.9  30.0 - 36.0 g/dL Final   RDW 07/11/2021 15.9 (H)  11.5 - 15.5 % Final   Platelets 07/11/2021 222  150 - 400 K/uL Final   nRBC 07/11/2021 0.0  0.0 - 0.2 % Final   Performed at [facility], [place] [address], [place], [place] [address]   Order Confirmation 07/11/2021    Final                   Value:ORDER PROCESSED BY BLOOD BANK Performed at [facility], [place] [address] [place], [place] [address]    Hemoglobin 07/11/2021 8.0 (L)  13.0 - 17.0 g/dL Final   HCT 07/11/2021 25.0 (L)  39.0 - 52.0 % Final   Performed at [facility], [place] [address] [place], [place] [address]   WBC 07/14/2021 6.2  4.0 - 10.5 K/uL Final   RBC 07/14/2021 2.88 (L)  4.22 - 5.81 MIL/uL Final   Hemoglobin 07/14/2021 8.4 (L)  13.0 - 17.0 g/dL Final   HCT 07/14/2021 26.3 (L)  39.0 - 52.0 % Final   MCV 07/14/2021 91.3  80.0 - 100.0 fL Final   MCH 07/14/2021 29.2  26.0 - 34.0 pg Final   MCHC 07/14/2021 31.9  30.0 - 36.0 g/dL Final   RDW 07/14/2021 15.9 (H)  11.5 - 15.5 % Final   Platelets 07/14/2021 264  150 - 400 K/uL Final   nRBC 07/14/2021 0.0  0.0 - 0.2 % Final   Performed at [facility], [place] [address], [place], [place] [address]   Sodium 07/14/2021 137  135 - 145 mmol/L Final   Potassium 07/14/2021 4.0  3.5 - 5.1 mmol/L Final   Chloride 07/14/2021 102  98 - 111 mmol/L Final   CO2 07/14/2021 29  22 - 32 mmol/L Final   Glucose, Bld 07/14/2021 94  70 - 99 mg/dL Final   Glucose reference range applies only to samples taken after fasting for at least 8 hours.   BUN 07/14/2021 10  6 - 20 mg/dL Final   Creatinine, Ser 07/14/2021 0.96  0.61 - 1.24 mg/dL Final   Calcium 07/14/2021 9.3  8.9 - 10.3 mg/dL Final   GFR, Estimated 07/14/2021 >60  >60 mL/min Final   Comment:  (NOTE) Calculated using the CKD-EPI Creatinine Equation (2021)    Anion gap 07/14/2021 6  5 - 15 Final   Performed at [facility], [place] [address], [place],  [address]  Hospital Outpatient Visit on 07/07/2021  Component Date Value Ref Range Status   ABO/RH(D) 07/07/2021 A POS   Final   Antibody Screen 07/07/2021 NEG   Final   Sample Expiration 07/07/2021 07/11/2021,2359   Final   Extend sample reason 07/07/2021 NO TRANSFUSIONS OR PREGNANCY IN THE PAST 3 MONTHS   Final   Unit Number 07/07/2021 [id number]   Final   Blood Component Type 07/07/2021 RBC LR PHER1   Final   Unit division 07/07/2021 00   Final   Status of Unit 07/07/2021 ISSUED,FINAL   Final   Transfusion Status 07/07/2021 OK TO TRANSFUSE   Final   Crossmatch Result 07/07/2021 Compatible   Final   Unit Number 07/07/2021 [id number]   Final   Blood Component Type 07/07/2021 RBC LR PHER1   Final   Unit division 07/07/2021 00   Final   Status of Unit 07/07/2021 ISSUED,FINAL   Final   Transfusion Status 07/07/2021 OK TO TRANSFUSE   Final   Crossmatch  Result 07/07/2021    Final                   Value:Compatible Performed at [facility], [place] [address] [place], [place] [address]    WBC 07/07/2021 6.1  4.0 - 10.5 K/uL Final   RBC 07/07/2021 4.03 (L)  4.22 - 5.81 MIL/uL Final   Hemoglobin 07/07/2021 11.6 (L)  13.0 - 17.0 g/dL Final   HCT 07/07/2021 36.0 (L)  39.0 - 52.0 % Final   MCV 07/07/2021 89.3  80.0 - 100.0 fL Final   MCH 07/07/2021 28.8  26.0 - 34.0 pg Final   MCHC 07/07/2021 32.2  30.0 - 36.0 g/dL Final   RDW 07/07/2021 14.8  11.5 - 15.5 % Final   Platelets 07/07/2021 300  150 - 400 K/uL Final   nRBC 07/07/2021 0.0  0.0 - 0.2 % Final   Performed at [facility], [place] [address], [place], [place] [address]   Sodium 07/07/2021 138  135 - 145 mmol/L Final   Potassium 07/07/2021 4.6  3.5 - 5.1 mmol/L Final   Chloride 07/07/2021 102  98 - 111 mmol/L  Final   CO2 07/07/2021 27  22 - 32 mmol/L Final   Glucose, Bld 07/07/2021 98  70 - 99 mg/dL Final   Glucose reference range applies only to samples taken after fasting for at least 8 hours.   BUN 07/07/2021 12  6 - 20 mg/dL Final   Creatinine, Ser 07/07/2021 1.05  0.61 - 1.24 mg/dL Final   Calcium 07/07/2021 9.3  8.9 - 10.3 mg/dL Final   Total Protein 07/07/2021 7.1  6.5 - 8.1 g/dL Final   Albumin 07/07/2021 3.5  3.5 - 5.0 g/dL Final   AST 07/07/2021 23  15 - 41 U/L Final   ALT 07/07/2021 24  0 - 44 U/L Final   Alkaline Phosphatase 07/07/2021 100  38 - 126 U/L Final   Total Bilirubin 07/07/2021 0.5  0.3 - 1.2 mg/dL Final   GFR, Estimated 07/07/2021 >60  >60 mL/min Final   Comment: (NOTE) Calculated using the CKD-EPI Creatinine Equation (2021)    Anion gap 07/07/2021 9  5 - 15 Final   Performed at [facility], [place] [address] [place], [place] [address]   MRSA, PCR 07/07/2021 NEGATIVE  NEGATIVE Final   Staphylococcus aureus 07/07/2021 NEGATIVE  NEGATIVE Final   Comment: (NOTE) The Xpert SA Assay (FDA approved for NASAL specimens in patients 25 years of age and older), is one component of a comprehensive surveillance program. It is not intended to diagnose infection nor to guide or monitor treatment. Performed at [facility], [place] [address] [place], [place] [address]    ABO/RH(D) 07/08/2021    Final                   Value:A POS Performed at [facility], [place] [address] [place], [place] [address]    ISSUE DATE / TIME 07/07/2021 [id number]   Final   Blood Product Unit Number 07/07/2021 [id number]   Final   PRODUCT CODE 07/07/2021 [id number]   Final   Unit Type and Rh 07/07/2021 6200   Final   Blood Product Expiration Date 07/07/2021 [id number]   Final   ISSUE DATE / TIME 07/07/2021 [id number]   Final   Blood Product Unit Number 07/07/2021 [id number]   Final   PRODUCT CODE 07/07/2021 [id number]   Final   Unit  Type and Rh 07/07/2021  6200   Final   Blood Product Expiration Date 07/07/2021 [id number]   Final     X-Rays:US EKG SITE RITE  Result Date: 07/09/2021 If Site Rite image not attached, placement could not be confirmed due to current cardiac rhythm.  DG Pelvis Portable  Result Date: 07/08/2021 CLINICAL DATA:  Right hip replacement EXAM: PORTABLE PELVIS 1-2 VIEWS COMPARISON:  None Available. FINDINGS: Bilateral total hip arthroplasty has been performed. Normal alignment on this limited examination. No unexpected fracture or dislocation. Surgical drains are seen surrounding the right hip. IMPRESSION: Bilateral total hip arthroplasty. No unexpected fracture or dislocation. Electronically Signed   By: Fidela Salisbury M.D.   On: 07/08/2021 18:41   DG C-Arm 1-60 Min-No Report  Result Date: 07/08/2021 Fluoroscopy was utilized by the requesting physician.  No radiographic interpretation.    EKG: Orders placed or performed during the hospital encounter of 04/21/21   ED EKG   ED EKG   EKG     Hospital Course: Keishon Chavarin is a 56 y.o. who was admitted to [facility]. They were brought to the operating room on 07/08/2021 and underwent Procedure(s): EXCISIONAL AND NON EXCISIONAL DEBRIDEMENT HIP.  Patient tolerated the procedure well and was later transferred to the recovery room and then to the orthopaedic floor for postoperative care. They were given PO and IV analgesics for pain control following their surgery. They were given 24 hours of postoperative antibiotics of  Anti-infectives (From admission, onward)    Start     Dose/Rate Route Frequency Ordered Stop   07/09/21 1215  cefTRIAXone (ROCEPHIN) 2 g in sodium chloride 0.9 % 100 mL IVPB  Status:  Discontinued        2 g 200 mL/hr over 30 Minutes Intravenous Every 24 hours 07/09/21 1117 07/14/21 2335   07/09/21 1200  vancomycin (VANCOREADY) IVPB 1250 mg/250 mL  Status:  Discontinued        1,250 mg 166.7 mL/hr over 90 Minutes  Intravenous Every 12 hours 07/08/21 1948 07/09/21 1149   07/09/21 1130  DAPTOmycin (CUBICIN) 750 mg in sodium chloride 0.9 % IVPB  Status:  Discontinued        8 mg/kg  96.6 kg (Adjusted) 130 mL/hr over 30 Minutes Intravenous Daily 07/09/21 1117 07/14/21 2335   07/08/21 2300  ceFAZolin (ANCEF) IVPB 2g/100 mL premix  Status:  Discontinued        2 g 200 mL/hr over 30 Minutes Intravenous Every 8 hours 07/08/21 1910 07/09/21 1117   07/08/21 2100  vancomycin (VANCOREADY) IVPB 2000 mg/400 mL        2,000 mg 200 mL/hr over 120 Minutes Intravenous  Once 07/08/21 1948 07/09/21 1618   07/08/21 1629  vancomycin (VANCOCIN) powder  Status:  Discontinued          As needed 07/08/21 1629 07/08/21 1950   07/08/21 1345  ceFAZolin (ANCEF) IVPB 2g/100 mL premix        2 g 200 mL/hr over 30 Minutes Intravenous On call to O.R. 07/08/21 1331 07/08/21 1607      and started on DVT prophylaxis in the form of Aspirin.   PT and OT were ordered for total joint protocol. Discharge planning consulted to help with postop disposition and equipment needs. Patient had a good night on the evening of surgery. They started to get up OOB with therapy on #POD 1. Infectious disease was consulted. PICC line placed. Continued to work with therapy into POD #2. Patient seen on POD #3. Hemoglobin was 6.9  this AM, and 2 units PRBC were transfused. POD 4-6 he continued to work with PT.  Pt was seen during rounds on day 6 and was ready to go home pending progress with therapy. Pt worked with therapy for 2 additional sessions and was meeting their goals. His dressing was changed. He was discharged to home later that day in stable condition.  Diet: Regular diet Activity: WBAT Follow-up: in 2 weeks Disposition: Home Discharged Condition: good   Discharge Instructions     [facility] pharmacist to adjust dose for Vancomycin, Aminoglycosides and other anti-infective therapies as requested by physician.   Complete by: As  directed    [facility] to provide Cath Flo 26m   Complete by: As directed    Administer for PICC line occlusion and as ordered by physician for other access device issues.   Anaphylaxis Kit: Provided to treat any anaphylactic reaction to the medication being provided to the patient if First Dose or when requested by physician   Complete by: As directed    Epinephrine 141mml vial / amp: Administer 0.38m75m0.38ml90mubcutaneously once for moderate to severe anaphylaxis, nurse to call physician and pharmacy when reaction occurs and call 911 if needed for immediate care   Diphenhydramine 50mg12mIV vial: Administer 25-50mg 85mM PRN for first dose reaction, rash, itching, mild reaction, nurse to call physician and pharmacy when reaction occurs   Sodium Chloride 0.9% NS 500ml I40mdminister if needed for hypovolemic blood pressure drop or as ordered by physician after call to physician with anaphylactic reaction   Change dressing on IV access line weekly and PRN   Complete by: As directed    Flush IV access with Sodium Chloride 0.9% and Heparin 10 units/ml or 100 units/ml   Complete by: As directed    Home infusion instructions - [facility]   Complete by: As directed    Instructions: Flush IV access with Sodium Chloride 0.9% and Heparin 10units/ml or 100units/ml   Change dressing on IV access line: Weekly and PRN   Instructions Cath Flo 2mg: Ad21mister for PICC Line occlusion and as ordered by physician for other access device   [facility] pharmacist to adjust dose for: Vancomycin, Aminoglycosides and other anti-infective therapies as requested by physician   Method of administration may be changed at the discretion of home infusion pharmacist based upon assessment of the patient and/or caregiver's ability to self-administer the medication ordered   Complete by: As directed       Allergies as of 07/14/2021       Reactions   Escitalopram Oxalate Anxiety         Medication List     STOP taking these medications    cefadroxil 500 MG capsule Commonly known as: DURICEF   diclofenac Sodium 1 % Gel Commonly known as: VOLTAREN   HYDROcodone-acetaminophen 10-325 MG tablet Commonly known as: NORCO       TAKE these medications    acetaminophen 325 MG tablet Commonly known as: TYLENOL Take 3 tablets (975 mg total) by mouth every 6 (six) hours.   albuterol 108 (90 Base) MCG/ACT inhaler Commonly known as: VENTOLIN HFA Inhale 2 puffs into the lungs every 6 (six) hours as needed for wheezing or shortness of breath.   aspirin 81 MG chewable tablet Chew 1 tablet (81 mg total) by mouth 2 (two) times daily for 28 days.   augmented betamethasone dipropionate 0.05 % cream Commonly known as: DIPROLENE-AF Apply 1 application topically 2 (  two) times daily as needed (eczema).   carvedilol 25 MG tablet Commonly known as: COREG Take 1 tablet (25 mg total) by mouth 2 (two) times daily.   cefTRIAXone  IVPB Commonly known as: ROCEPHIN Inject 2 g into the vein daily. Indication:  R hip prosthetic joint infection First Dose: Yes Last Day of Therapy:  08/18/2021 Labs - Once weekly:  CBC/D and BMP, Labs - Every other week:  ESR and CRP Method of administration: IV Push Method of administration may be changed at the discretion of home infusion pharmacist based upon assessment of the patient and/or caregiver's ability to self-administer the medication ordered.   daptomycin  IVPB Commonly known as: CUBICIN Inject 750 mg into the vein daily. Indication:  Right hip PJI First Dose: Yes Last Day of Therapy:  08/18/21 Labs - Once weekly:  CBC/D, BMP, and CPK Labs - Every other week:  ESR and CRP Method of administration: IV Push Method of administration may be changed at the discretion of home infusion pharmacist based upon assessment of the patient and/or caregiver's ability to self-administer the medication ordered.   docusate sodium 100 MG  capsule Commonly known as: COLACE Take 1 capsule (100 mg total) by mouth 2 (two) times daily.   DULoxetine 60 MG capsule Commonly known as: CYMBALTA Take 60 mg by mouth daily.   ferrous sulfate 325 (65 FE) MG tablet Take 1 tablet (325 mg total) by mouth 3 (three) times daily after meals for 14 days.   fluticasone 50 MCG/ACT nasal spray Commonly known as: FLONASE Place 2 sprays into both nostrils in the morning and at bedtime.   Ipratropium-Albuterol 20-100 MCG/ACT Aers respimat Commonly known as: COMBIVENT Inhale 1 puff into the lungs in the morning and at bedtime.   MENS MULTIVITAMIN PLUS PO Take 1 tablet by mouth daily.   methocarbamol 500 MG tablet Commonly known as: ROBAXIN Take 1 tablet (500 mg total) by mouth every 6 (six) hours as needed for muscle spasms.   oxyCODONE 5 MG immediate release tablet Commonly known as: Oxy IR/ROXICODONE Take 1-2 tablets (5-10 mg total) by mouth every 4 (four) hours as needed for moderate pain or severe pain (pain score 4-6).   pantoprazole 40 MG tablet Commonly known as: PROTONIX Take 40 mg by mouth daily.   polyethylene glycol 17 g packet Commonly known as: MIRALAX / GLYCOLAX Take 17 g by mouth daily as needed for mild constipation.   pregabalin 150 MG capsule Commonly known as: LYRICA Take 150 mg by mouth 2 (two) times daily.   rosuvastatin 20 MG tablet Commonly known as: CRESTOR Take 1 tablet (20 mg total) by mouth daily.               Discharge Care Instructions  (From admission, onward)           Start     Ordered   07/11/21 0000  Change dressing on IV access line weekly and PRN  (Home infusion instructions - [facility] )        07/11/21 0711            Follow-up Information     Paralee Cancel, MD. Schedule an appointment as soon as possible for a visit in 2 week(s).   Specialty: Orthopedic Surgery Contact information: [address] [place] [address] [phone]                  Signed: Griffith Citron, PA-C Orthopedic Surgery 07/30/2021, 9:01 AM

## 2021-07-30 NOTE — Plan of Care (Signed)
  Problem: Education: Goal: Knowledge of General Education information will improve Description: Including pain rating scale, medication(s)/side effects and non-pharmacologic comfort measures Outcome: Progressing   Problem: Pain Managment: Goal: General experience of comfort will improve Outcome: Progressing   Problem: Safety: Goal: Ability to remain free from injury will improve Outcome: Progressing   

## 2021-07-30 NOTE — Progress Notes (Signed)
Subjective: 3 Days Post-Op Procedure(s) (LRB): EVACUATION HEMATOMA (Right) INCISIONAL AND NON-INCISIONAL WOUND DEBRIDEMENT AND PRIMARY WOUND CLOSURE (Right) Patient reports pain as mild.   Patient seen in rounds for Dr. Alvan Dame. Patient is sitting in bed on exam. He tells me his drain fell out yesterday overnight. He is feeling positive about his hip, and tells me it feels the best it has since March. He is ready to get home today.   Objective: Vital signs in last 24 hours: Temp:  [97.7 F (36.5 C)-98 F (36.7 C)] 98 F (36.7 C) (07/05 0531) Pulse Rate:  [64-76] 72 (07/05 0531) Resp:  [14-18] 14 (07/05 0531) BP: (100-125)/(64-83) 117/75 (07/05 0531) SpO2:  [94 %-99 %] 94 % (07/05 0531)  Intake/Output from previous day:  Intake/Output Summary (Last 24 hours) at 07/30/2021 0843 Last data filed at 07/30/2021 0600 Gross per 24 hour  Intake 829.94 ml  Output 1171 ml  Net -341.06 ml     Intake/Output this shift: No intake/output data recorded.  Labs: Recent Labs    07/28/21 0330 07/29/21 0356  HGB 8.7* 8.6*   Recent Labs    07/28/21 0330 07/29/21 0356  WBC 4.4 5.1  RBC 2.98* 2.92*  HCT 28.0* 27.4*  PLT 228 236   Recent Labs    07/28/21 0330  NA 135  K 4.2  CL 103  CO2 23  BUN 13  CREATININE 0.78  GLUCOSE 119*  CALCIUM 8.8*   No results for input(s): "LABPT", "INR" in the last 72 hours.  Exam: General - Patient is Alert and Oriented Extremity - Neurologically intact Sensation intact distally Intact pulses distally Dorsiflexion/Plantar flexion intact Dressing - Mild drainage on the distal aquacel as well as the drain site bandage. Both dressings replaced. No active bleeding/drainage from either site.  Motor Function - intact, moving foot and toes well on exam.   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   40 mm by echo   Chest pain of uncertain etiology    Normal coronaries after abnormal  Myoview Feb 2021 Echo March 2021 showed normal LVF- mild LVH    Chest pain of unknown etiology 02/2019   normal coronaries after abnormal Myoview   COPD (chronic obstructive pulmonary disease) ([place])    Dyspnea    Encounter for screening for COVID-19 07/01/2021   Generalized enlarged lymph nodes 07/01/2021   Hypertension    normal RA dopplers   Hyponatremia    Other allergy status, other than to drugs and biological substances 07/01/2021   Other and unspecified complications of medical care, not elsewhere classified 07/01/2021   Other ill-defined and unknown causes of morbidity and mortality 07/01/2021   Overweight    Pain in left knee 07/01/2021   Pain in right foot 07/01/2021    Assessment/Plan: 3 Days Post-Op Procedure(s) (LRB): EVACUATION HEMATOMA (Right) INCISIONAL AND NON-INCISIONAL WOUND DEBRIDEMENT AND PRIMARY WOUND CLOSURE (Right) Principal Problem:   Right hip pain  Estimated body mass index is 31.25 kg/m as calculated from the following:   Height as of this encounter: '6\' 3"'$  (1.905 m).   Weight as of this encounter: 113.4 kg. Advance diet D/C IV fluids  DVT Prophylaxis - Aspirin Weight bearing as tolerated.  Discussed nicotine as a risk factor for wound healing/infection.   Plan is to go Home after hospital stay. Patient prefers to be back on hydrocodone at discharge as this is his baseline pain medicine. Follow up in  the office in 2 weeks.   Griffith Citron, PA-C Orthopedic Surgery [phone] 07/30/2021, 8:43 AM

## 2021-07-30 NOTE — Discharge Instructions (Signed)
INSTRUCTIONS AFTER SURGERY  Using nicotine is a risk factor for wound healing issues and infection. Please minimize or discontinue use of nicotine products for 4-6 weeks after surgery.    Remove items at home which could result in a fall. This includes throw rugs or furniture in walking pathways ICE to the affected joint every three hours while awake for 30 minutes at a time, for at least the first 3-5 days, and then as needed for pain and swelling.  Continue to use ice for pain and swelling. You may notice swelling that will progress down to the foot and ankle.  This is normal after surgery.  Elevate your leg when you are not up walking on it.   Continue to use the breathing machine you got in the hospital (incentive spirometer) which will help keep your temperature down.  It is common for your temperature to cycle up and down following surgery, especially at night when you are not up moving around and exerting yourself.  The breathing machine keeps your lungs expanded and your temperature down.   DIET:  As you were doing prior to hospitalization, we recommend a well-balanced diet.  DRESSING / WOUND CARE / SHOWERING  Keep the surgical dressing until follow up.  The dressing is water proof, so you can shower without any extra covering.  IF THE DRESSING FALLS OFF or the wound gets wet inside, change the dressing with sterile gauze.  Please use good hand washing techniques before changing the dressing.  Do not use any lotions or creams on the incision until instructed by your surgeon.    ACTIVITY  Increase activity slowly as tolerated, but follow the weight bearing instructions below.   No driving for 6 weeks or until further direction given by your physician.  You cannot drive while taking narcotics.  No lifting or carrying greater than 10 lbs. until further directed by your surgeon. Avoid periods of inactivity such as sitting longer than an hour when not asleep. This helps prevent blood clots.   You may return to work once you are authorized by your doctor.     WEIGHT BEARING   Weight bearing as tolerated with assist device (walker, cane, etc) as directed, use it as long as suggested by your surgeon or therapist, typically at least 4-6 weeks.  CONSTIPATION  Constipation is defined medically as fewer than three stools per week and severe constipation as less than one stool per week.  Even if you have a regular bowel pattern at home, your normal regimen is likely to be disrupted due to multiple reasons following surgery.  Combination of anesthesia, postoperative narcotics, change in appetite and fluid intake all can affect your bowels.   YOU MUST use at least one of the following options; they are listed in order of increasing strength to get the job done.  They are all available over the counter, and you may need to use some, POSSIBLY even all of these options:    Drink plenty of fluids (prune juice may be helpful) and high fiber foods Colace 100 mg by mouth twice a day  Senokot for constipation as directed and as needed Dulcolax (bisacodyl), take with full glass of water  Miralax (polyethylene glycol) once or twice a day as needed.  If you have tried all these things and are unable to have a bowel movement in the first 3-4 days after surgery call either your surgeon or your primary doctor.    If you experience loose stools or  diarrhea, hold the medications until you stool forms back up.  If your symptoms do not get better within 1 week or if they get worse, check with your doctor.  If you experience "the worst abdominal pain ever" or develop nausea or vomiting, please contact the office immediately for further recommendations for treatment.   ITCHING:  If you experience itching with your medications, try taking only a single pain pill, or even half a pain pill at a time.  You can also use Benadryl over the counter for itching or also to help with sleep.   TED HOSE STOCKINGS:  Use  stockings on both legs until for at least 2 weeks or as directed by physician office. They may be removed at night for sleeping.  MEDICATIONS:  See your medication summary on the "After Visit Summary" that nursing will review with you.  You may have some home medications which will be placed on hold until you complete the course of blood thinner medication.  It is important for you to complete the blood thinner medication as prescribed.  PRECAUTIONS:  If you experience chest pain or shortness of breath - call 911 immediately for transfer to the hospital emergency department.   If you develop a fever greater that 101 F, purulent drainage from wound, increased redness or drainage from wound, foul odor from the wound/dressing, or calf pain - CONTACT YOUR SURGEON.                                                   FOLLOW-UP APPOINTMENTS:  If you do not already have a post-op appointment, please call the office for an appointment to be seen by your surgeon.  Guidelines for how soon to be seen are listed in your "After Visit Summary", but are typically between 1-4 weeks after surgery.  POST-OPERATIVE OPIOID TAPER INSTRUCTIONS: It is important to wean off of your opioid medication as soon as possible. If you do not need pain medication after your surgery it is ok to stop day one. Opioids include: Codeine, Hydrocodone(Norco, Vicodin), Oxycodone(Percocet, oxycontin) and hydromorphone amongst others.  Long term and even short term use of opiods can cause: Increased pain response Dependence Constipation Depression Respiratory depression And more.  Withdrawal symptoms can include Flu like symptoms Nausea, vomiting And more Techniques to manage these symptoms Hydrate well Eat regular healthy meals Stay active Use relaxation techniques(deep breathing, meditating, yoga) Do Not substitute Alcohol to help with tapering If you have been on opioids for less than two weeks and do not have pain than it is  ok to stop all together.  Plan to wean off of opioids This plan should start within one week post op of your joint replacement. Maintain the same interval or time between taking each dose and first decrease the dose.  Cut the total daily intake of opioids by one tablet each day Next start to increase the time between doses. The last dose that should be eliminated is the evening dose.   MAKE SURE YOU:  Understand these instructions.  Get help right away if you are not doing well or get worse.    Thank you for letting us be a part of your medical care team.  It is a privilege we respect greatly.  We hope these instructions will help you stay on track for a fast and  full recovery!

## 2021-08-07 ENCOUNTER — Telehealth: Payer: Self-pay

## 2021-08-07 NOTE — Telephone Encounter (Signed)
Patient called wanting to know if he would be able to return to work with a PICC line in place. 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   We discussed that most patients with a PICC line do not work during that time. He agrees that it would be best for him to wait until the PICC line is removed to return to work and he will have his orthopedic surgeon's office assist with return to work forms.   Asked him to please call with any further questions or concerns.   Beryle Flock, RN

## 2021-08-12 NOTE — Discharge Summary (Signed)
Physician Discharge Summary   Patient ID: Reginald Walsh MRN: [id number] DOB/AGE: 1965/06/04 56 y.o.  Admit date: 07/25/2021 Discharge date: 07/30/2021  Primary Diagnosis: Right hip postoperative hematoma with wound dehiscence and persistent bloody drainage.  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   40 mm by echo   Chest pain of uncertain etiology    Normal coronaries after abnormal Myoview Feb 2021 Echo March 2021 showed normal LVF- mild LVH    Chest pain of unknown etiology 02/2019   normal coronaries after abnormal Myoview   COPD (chronic obstructive pulmonary disease) ([place])    Dyspnea    Encounter for screening for COVID-19 07/01/2021   Generalized enlarged lymph nodes 07/01/2021   Hypertension    normal RA dopplers   Hyponatremia    Other allergy status, other than to drugs and biological substances 07/01/2021   Other and unspecified complications of medical care, not elsewhere classified 07/01/2021   Other ill-defined and unknown causes of morbidity and mortality 07/01/2021   Overweight    Pain in left knee 07/01/2021   Pain in right foot 07/01/2021   Discharge Diagnoses:   Principal Problem:   Right hip pain  Estimated body mass index is 31.25 kg/m as calculated from the following:   Height as of this encounter: 6' 3"  (1.905 m).   Weight as of this encounter: 113.4 kg.  Procedure:  Procedure(s) (LRB): EVACUATION HEMATOMA (Right) INCISIONAL AND NON-INCISIONAL WOUND DEBRIDEMENT AND PRIMARY WOUND CLOSURE (Right)   Consults: None  HPI:  The patient is a pleasant 56 year old male with history of primary right total hip arthroplasty.  During his postoperative period, he had been doing very well and then developed a significant cellulitic response around his  right hip.  This was initially treated with oral antibiotics with resolution of the cellulitic response; however, he had  recurrence once the antibiotics were stopped.  At the second occurrence of his recurrence of the cellulitic changes he had  superficial wound drainage.  He was then taken to the operating room for an I and D of his right hip with excisional and non-excisional of wound down to the joint with irrigation and debridement about 2 weeks ago.  At the time of that closure I elected  using nylon sutures.  He had persistent drainage.  This was addressed in the office with reapplication of a couple other sutures as well as further Dermabond.  He had persistent drainage.  His most recent visit on Friday 07/25/2021, we recognized that  there was some about 4-5 mm of wound dehiscence proximally with persistent drainage.  At this point, I was concerned about a subcutaneous and deep hematoma that could be putting pressure on his wound preventing this from healing.  I admitted him to the  hospital with plans to take him to the operating room and perform above procedures.  Rationale and indication of the procedure were discussed.  The risk of persistent bloody drainage and need for future surgeries were discussed and reviewed.  Consent was  obtained for benefit of pain relief.  Laboratory Data: Admission on 07/25/2021, Discharged on 07/30/2021  Component Date Value Ref Range Status   WBC 07/26/2021 4.3  4.0 - 10.5 K/uL Final   RBC 07/26/2021 3.06 (L)  4.22 - 5.81 MIL/uL Final   Hemoglobin 07/26/2021 9.1 (L)  13.0 - 17.0 g/dL Final   HCT 07/26/2021 28.1 (L)  39.0 - 52.0 %  Final   MCV 07/26/2021 91.8  80.0 - 100.0 fL Final   MCH 07/26/2021 29.7  26.0 - 34.0 pg Final   MCHC 07/26/2021 32.4  30.0 - 36.0 g/dL Final   RDW 07/26/2021 16.0 (H)  11.5 - 15.5 % Final   Platelets 07/26/2021 208  150 - 400 K/uL Final   nRBC 07/26/2021 0.0  0.0 - 0.2 % Final   Performed at [facility], [place] [address], [place], [place] [address]   Sodium 07/26/2021 137  135 - 145 mmol/L Final   Potassium 07/26/2021 4.0  3.5  - 5.1 mmol/L Final   Chloride 07/26/2021 105  98 - 111 mmol/L Final   CO2 07/26/2021 26  22 - 32 mmol/L Final   Glucose, Bld 07/26/2021 95  70 - 99 mg/dL Final   Glucose reference range applies only to samples taken after fasting for at least 8 hours.   BUN 07/26/2021 11  6 - 20 mg/dL Final   Creatinine, Ser 07/26/2021 0.74  0.61 - 1.24 mg/dL Final   Calcium 07/26/2021 9.0  8.9 - 10.3 mg/dL Final   GFR, Estimated 07/26/2021 >60  >60 mL/min Final   Comment: (NOTE) Calculated using the CKD-EPI Creatinine Equation (2021)    Anion gap 07/26/2021 6  5 - 15 Final   Performed at [facility], [place] [address] [place], [place] [address]   MRSA, PCR 07/26/2021 NEGATIVE  NEGATIVE Final   Staphylococcus aureus 07/26/2021 NEGATIVE  NEGATIVE Final   Comment: (NOTE) The Xpert SA Assay (FDA approved for NASAL specimens in patients 40 years of age and older), is one component of a comprehensive surveillance program. It is not intended to diagnose infection nor to guide or monitor treatment. Performed at [facility], [place] [address], [place], [place] [address]    WBC 07/28/2021 4.4  4.0 - 10.5 K/uL Final   RBC 07/28/2021 2.98 (L)  4.22 - 5.81 MIL/uL Final   Hemoglobin 07/28/2021 8.7 (L)  13.0 - 17.0 g/dL Final   HCT 07/28/2021 28.0 (L)  39.0 - 52.0 % Final   MCV 07/28/2021 94.0  80.0 - 100.0 fL Final   MCH 07/28/2021 29.2  26.0 - 34.0 pg Final   MCHC 07/28/2021 31.1  30.0 - 36.0 g/dL Final   RDW 07/28/2021 15.8 (H)  11.5 - 15.5 % Final   Platelets 07/28/2021 228  150 - 400 K/uL Final   nRBC 07/28/2021 0.0  0.0 - 0.2 % Final   Performed at [facility], [place] [address], [place], [place] [address]   Sodium 07/28/2021 135  135 - 145 mmol/L Final   Potassium 07/28/2021 4.2  3.5 - 5.1 mmol/L Final   Chloride 07/28/2021 103  98 - 111 mmol/L Final   CO2 07/28/2021 23  22 - 32 mmol/L Final   Glucose, Bld 07/28/2021 119 (H)  70 - 99 mg/dL Final    Glucose reference range applies only to samples taken after fasting for at least 8 hours.   BUN 07/28/2021 13  6 - 20 mg/dL Final   Creatinine, Ser 07/28/2021 0.78  0.61 - 1.24 mg/dL Final   Calcium 07/28/2021 8.8 (L)  8.9 - 10.3 mg/dL Final   GFR, Estimated 07/28/2021 >60  >60 mL/min Final   Comment: (NOTE) Calculated using the CKD-EPI Creatinine Equation (2021)    Anion gap 07/28/2021 9  5 - 15 Final   Performed at [facility], [place] [address] [place], [place] [address]  Total CK 07/28/2021 58  49 - 397 U/L Final   Performed at [facility], [place] [address] [place], [place] [address]   WBC 07/29/2021 5.1  4.0 - 10.5 K/uL Final   RBC 07/29/2021 2.92 (L)  4.22 - 5.81 MIL/uL Final   Hemoglobin 07/29/2021 8.6 (L)  13.0 - 17.0 g/dL Final   HCT 07/29/2021 27.4 (L)  39.0 - 52.0 % Final   MCV 07/29/2021 93.8  80.0 - 100.0 fL Final   MCH 07/29/2021 29.5  26.0 - 34.0 pg Final   MCHC 07/29/2021 31.4  30.0 - 36.0 g/dL Final   RDW 07/29/2021 16.5 (H)  11.5 - 15.5 % Final   Platelets 07/29/2021 236  150 - 400 K/uL Final   nRBC 07/29/2021 0.0  0.0 - 0.2 % Final   Performed at [facility], [place] [address], [place], [place] [address]  Admission on 07/08/2021, Discharged on 07/14/2021  Component Date Value Ref Range Status   Specimen Description 07/08/2021    Final                   Value:WOUND Performed at [facility], [place] [address], [place], [place] [address]    Special Requests 07/08/2021 RIGHT HIP   Final   Gram Stain 07/08/2021    Final                   Value:FEW WBC PRESENT,BOTH PMN AND MONONUCLEAR NO ORGANISMS SEEN    Culture 07/08/2021    Final                   Value:No growth aerobically or anaerobically. Performed at [facility], [place] [address], [place], [place] [address]    Report Status 07/08/2021 07/13/2021 FINAL   Final   Sodium 07/09/2021 133 (L)  135 - 145 mmol/L Final   Potassium  07/09/2021 4.8  3.5 - 5.1 mmol/L Final   Chloride 07/09/2021 98  98 - 111 mmol/L Final   CO2 07/09/2021 26  22 - 32 mmol/L Final   Glucose, Bld 07/09/2021 191 (H)  70 - 99 mg/dL Final   Glucose reference range applies only to samples taken after fasting for at least 8 hours.   BUN 07/09/2021 10  6 - 20 mg/dL Final   Creatinine, Ser 07/09/2021 0.85  0.61 - 1.24 mg/dL Final   Calcium 07/09/2021 8.6 (L)  8.9 - 10.3 mg/dL Final   GFR, Estimated 07/09/2021 >60  >60 mL/min Final   Comment: (NOTE) Calculated using the CKD-EPI Creatinine Equation (2021)    Anion gap 07/09/2021 9  5 - 15 Final   Performed at [facility], [place] [address], [place], [place] [address]   WBC 07/09/2021 6.2  4.0 - 10.5 K/uL Final   RBC 07/09/2021 3.03 (L)  4.22 - 5.81 MIL/uL Final   Hemoglobin 07/09/2021 8.8 (L)  13.0 - 17.0 g/dL Final   HCT 07/09/2021 26.9 (L)  39.0 - 52.0 % Final   MCV 07/09/2021 88.8  80.0 - 100.0 fL Final   MCH 07/09/2021 29.0  26.0 - 34.0 pg Final   MCHC 07/09/2021 32.7  30.0 - 36.0 g/dL Final   RDW 07/09/2021 14.6  11.5 - 15.5 % Final   Platelets 07/09/2021 279  150 - 400 K/uL Final   nRBC 07/09/2021 0.0  0.0 - 0.2 % Final   Performed at [facility], [place] [address], [place], [place] [address]  WBC 07/10/2021 8.2  4.0 - 10.5 K/uL Final   RBC 07/10/2021 2.56 (L)  4.22 - 5.81 MIL/uL Final   Hemoglobin 07/10/2021 7.4 (L)  13.0 - 17.0 g/dL Final   HCT 07/10/2021 22.8 (L)  39.0 - 52.0 % Final   MCV 07/10/2021 89.1  80.0 - 100.0 fL Final   MCH 07/10/2021 28.9  26.0 - 34.0 pg Final   MCHC 07/10/2021 32.5  30.0 - 36.0 g/dL Final   RDW 07/10/2021 15.3  11.5 - 15.5 % Final   Platelets 07/10/2021 312  150 - 400 K/uL Final   nRBC 07/10/2021 0.0  0.0 - 0.2 % Final   Performed at [facility], [place] [address], [place], [place] [address]   Total CK 07/10/2021 136  49 - 397 U/L Final   Performed at [facility], [place] [address], [place], [place] [address]   WBC 07/11/2021 7.1  4.0 - 10.5 K/uL Final   RBC 07/11/2021 2.38 (L)  4.22 - 5.81 MIL/uL Final   Hemoglobin 07/11/2021 6.9 (LL)  13.0 - 17.0 g/dL Final   This critical result has verified and been called to [facility] by VAZQUEZ,JACKIE on 06 16 2023 at 0457, and has been read back. CRITICAL RESULTS CALLED   HCT 07/11/2021 21.6 (L)  39.0 - 52.0 % Final   MCV 07/11/2021 90.8  80.0 - 100.0 fL Final   MCH 07/11/2021 29.0  26.0 - 34.0 pg Final   MCHC 07/11/2021 31.9  30.0 - 36.0 g/dL Final   RDW 07/11/2021 15.9 (H)  11.5 - 15.5 % Final   Platelets 07/11/2021 222  150 - 400 K/uL Final   nRBC 07/11/2021 0.0  0.0 - 0.2 % Final   Performed at [facility], [place] [address], [place],  [address]   Order Confirmation 07/11/2021    Final                   Value:ORDER PROCESSED BY BLOOD BANK Performed at [facility], [place] [address] [place], [place] [address]    Hemoglobin 07/11/2021 8.0 (L)  13.0 - 17.0 g/dL Final   HCT 07/11/2021 25.0 (L)  39.0 - 52.0 % Final   Performed at [facility], [place] [address] [place], [place] [address]   WBC 07/14/2021 6.2  4.0 - 10.5 K/uL Final   RBC 07/14/2021 2.88 (L)  4.22 - 5.81 MIL/uL Final   Hemoglobin 07/14/2021 8.4 (L)  13.0 - 17.0 g/dL Final   HCT 07/14/2021 26.3 (L)  39.0 - 52.0 % Final   MCV 07/14/2021 91.3  80.0 - 100.0 fL Final   MCH 07/14/2021 29.2  26.0 - 34.0 pg Final   MCHC 07/14/2021 31.9  30.0 - 36.0 g/dL Final   RDW 07/14/2021 15.9 (H)  11.5 - 15.5 % Final   Platelets 07/14/2021 264  150 - 400 K/uL Final   nRBC 07/14/2021 0.0  0.0 - 0.2 % Final   Performed at [facility], [place] [address], [place], [place] [address]   Sodium 07/14/2021 137  135 - 145 mmol/L Final   Potassium 07/14/2021 4.0  3.5 - 5.1 mmol/L Final   Chloride 07/14/2021 102  98 - 111 mmol/L Final   CO2 07/14/2021 29  22 - 32 mmol/L Final   Glucose, Bld 07/14/2021 94  70 - 99  mg/dL Final   Glucose reference range applies only to samples taken after fasting for at least 8 hours.  BUN 07/14/2021 10  6 - 20 mg/dL Final   Creatinine, Ser 07/14/2021 0.96  0.61 - 1.24 mg/dL Final   Calcium 07/14/2021 9.3  8.9 - 10.3 mg/dL Final   GFR, Estimated 07/14/2021 >60  >60 mL/min Final   Comment: (NOTE) Calculated using the CKD-EPI Creatinine Equation (2021)    Anion gap 07/14/2021 6  5 - 15 Final   Performed at [facility], [place] [address] [place], [place] [address]  Hospital Outpatient Visit on 07/07/2021  Component Date Value Ref Range Status   ABO/RH(D) 07/07/2021 A POS   Final   Antibody Screen 07/07/2021 NEG   Final   Sample Expiration 07/07/2021 07/11/2021,2359   Final   Extend sample reason 07/07/2021 NO TRANSFUSIONS OR PREGNANCY IN THE PAST 3 MONTHS   Final   Unit Number 07/07/2021 [id number]   Final   Blood Component Type 07/07/2021 RBC LR PHER1   Final   Unit division 07/07/2021 00   Final   Status of Unit 07/07/2021 ISSUED,FINAL   Final   Transfusion Status 07/07/2021 OK TO TRANSFUSE   Final   Crossmatch Result 07/07/2021 Compatible   Final   Unit Number 07/07/2021 [id number]   Final   Blood Component Type 07/07/2021 RBC LR PHER1   Final   Unit division 07/07/2021 00   Final   Status of Unit 07/07/2021 ISSUED,FINAL   Final   Transfusion Status 07/07/2021 OK TO TRANSFUSE   Final   Crossmatch Result 07/07/2021    Final                   Value:Compatible Performed at [facility], [place] [address], [place], [place] [address]    WBC 07/07/2021 6.1  4.0 - 10.5 K/uL Final   RBC 07/07/2021 4.03 (L)  4.22 - 5.81 MIL/uL Final   Hemoglobin 07/07/2021 11.6 (L)  13.0 - 17.0 g/dL Final   HCT 07/07/2021 36.0 (L)  39.0 - 52.0 % Final   MCV 07/07/2021 89.3  80.0 - 100.0 fL Final   MCH 07/07/2021 28.8  26.0 - 34.0 pg Final   MCHC 07/07/2021 32.2  30.0 - 36.0 g/dL Final   RDW 07/07/2021 14.8  11.5 - 15.5 % Final   Platelets  07/07/2021 300  150 - 400 K/uL Final   nRBC 07/07/2021 0.0  0.0 - 0.2 % Final   Performed at [facility], [place] [address], [place], [place] [address]   Sodium 07/07/2021 138  135 - 145 mmol/L Final   Potassium 07/07/2021 4.6  3.5 - 5.1 mmol/L Final   Chloride 07/07/2021 102  98 - 111 mmol/L Final   CO2 07/07/2021 27  22 - 32 mmol/L Final   Glucose, Bld 07/07/2021 98  70 - 99 mg/dL Final   Glucose reference range applies only to samples taken after fasting for at least 8 hours.   BUN 07/07/2021 12  6 - 20 mg/dL Final   Creatinine, Ser 07/07/2021 1.05  0.61 - 1.24 mg/dL Final   Calcium 07/07/2021 9.3  8.9 - 10.3 mg/dL Final   Total Protein 07/07/2021 7.1  6.5 - 8.1 g/dL Final   Albumin 07/07/2021 3.5  3.5 - 5.0 g/dL Final   AST 07/07/2021 23  15 - 41 U/L Final   ALT 07/07/2021 24  0 - 44 U/L Final   Alkaline Phosphatase 07/07/2021 100  38 - 126 U/L Final   Total Bilirubin 07/07/2021 0.5  0.3 - 1.2 mg/dL Final   GFR, Estimated  07/07/2021 >60  >60 mL/min Final   Comment: (NOTE) Calculated using the CKD-EPI Creatinine Equation (2021)    Anion gap 07/07/2021 9  5 - 15 Final   Performed at [facility], [place] [address] [place], [place] [address]   MRSA, PCR 07/07/2021 NEGATIVE  NEGATIVE Final   Staphylococcus aureus 07/07/2021 NEGATIVE  NEGATIVE Final   Comment: (NOTE) The Xpert SA Assay (FDA approved for NASAL specimens in patients 42 years of age and older), is one component of a comprehensive surveillance program. It is not intended to diagnose infection nor to guide or monitor treatment. Performed at [facility], [place] [address] [place], [place] [address]    ABO/RH(D) 07/08/2021    Final                   Value:A POS Performed at [facility], [place] [address], [place], [place] [address]    ISSUE DATE / TIME 07/07/2021 [id number]   Final   Blood Product Unit Number 07/07/2021 [id number]   Final    PRODUCT CODE 07/07/2021 [id number]   Final   Unit Type and Rh 07/07/2021 6200   Final   Blood Product Expiration Date 07/07/2021 [id number]   Final   ISSUE DATE / TIME 07/07/2021 [id number]   Final   Blood Product Unit Number 07/07/2021 [id number]   Final   PRODUCT CODE 07/07/2021 [id number]   Final   Unit Type and Rh 07/07/2021 6200   Final   Blood Product Expiration Date 07/07/2021 [id number]   Final     X-Rays:No results found.  EKG: Orders placed or performed during the hospital encounter of 04/21/21   ED EKG   ED EKG   EKG     Hospital Course: Siris Hoos is a 56 y.o. who was admitted to [facility]. They were brought to the operating room on 07/27/2021 and underwent Procedure(s): [place] AND PRIMARY WOUND CLOSURE.  Patient tolerated the procedure well and was later transferred to the recovery room and then to the orthopaedic floor for postoperative care. They were given PO and IV analgesics for pain control following their surgery. They were given 24 hours of postoperative antibiotics of  Anti-infectives (From admission, onward)    Start     Dose/Rate Route Frequency Ordered Stop   07/28/21 0000  cefTRIAXone (ROCEPHIN) IVPB        2 g Intravenous Every 24 hours 07/28/21 1812 09/07/21 2359   07/28/21 0000  daptomycin (CUBICIN) IVPB        750 mg Intravenous Every 24 hours 07/28/21 1812 09/07/21 2359   07/27/21 1115  cefTRIAXone (ROCEPHIN) IVPB  Status:  Discontinued        2 g Intravenous Every 24 hours 07/27/21 1017 07/27/21 1028   07/27/21 1115  daptomycin (CUBICIN) IVPB  Status:  Discontinued        750 mg Intravenous Every 24 hours 07/27/21 1017 07/27/21 1028   07/27/21 0730  ceFAZolin (ANCEF) IVPB 2g/100 mL premix        2 g 200 mL/hr over 30 Minutes Intravenous On call to O.R. 07/27/21 9767 07/27/21 0822   07/27/21 0640  ceFAZolin (ANCEF) 2-4 GM/100ML-% IVPB       Note to Pharmacy: Dara Lords  M: cabinet override      07/27/21 0640 07/27/21 0753   07/26/21 0800  cefTRIAXone (ROCEPHIN) 2 g in sodium chloride 0.9 % 100 mL IVPB  Status:  Discontinued  2 g 200 mL/hr over 30 Minutes Intravenous Every 24 hours 07/25/21 2050 07/30/21 1701   07/26/21 0800  DAPTOmycin (CUBICIN) 750 mg in sodium chloride 0.9 % IVPB  Status:  Discontinued        750 mg 130 mL/hr over 30 Minutes Intravenous Every 24 hours 07/25/21 2050 07/30/21 1701      and started on DVT prophylaxis in the form of Aspirin.   PT and OT were ordered. Discharge planning consulted to help with postop disposition and equipment needs. Patient had a fair night on the evening of surgery. He was doing well on POD #1. His drain fell out on POD #2.    Pt was seen during rounds on day three and was ready to go home. He was discharged to home later that day in stable condition.  Diet: Regular diet Activity: WBAT Follow-up: in 2 weeks Disposition: Home Discharged Condition: good   Discharge Instructions     [facility] pharmacist to adjust dose for Vancomycin, Aminoglycosides and other anti-infective therapies as requested by physician.   Complete by: As directed    [facility] to provide Cath Flo 86m   Complete by: As directed    Administer for PICC line occlusion and as ordered by physician for other access device issues.   Anaphylaxis Kit: Provided to treat any anaphylactic reaction to the medication being provided to the patient if First Dose or when requested by physician   Complete by: As directed    Epinephrine 175mml vial / amp: Administer 0.12m48m0.12ml11mubcutaneously once for moderate to severe anaphylaxis, nurse to call physician and pharmacy when reaction occurs and call 911 if needed for immediate care   Diphenhydramine 50mg72mIV vial: Administer 25-50mg 912mM PRN for first dose reaction, rash, itching, mild reaction, nurse to call physician and pharmacy when reaction occurs   Sodium  Chloride 0.9% NS 500ml I74mdminister if needed for hypovolemic blood pressure drop or as ordered by physician after call to physician with anaphylactic reaction   Call MD / Call 911   Complete by: As directed    If you experience chest pain or shortness of breath, CALL 911 and be transported to the hospital emergency room.  If you develope a fever above 101 F, pus (white drainage) or increased drainage or redness at the wound, or calf pain, call your surgeon's office.   Change dressing   Complete by: As directed    Maintain surgical dressing until follow up in the clinic. If the edges start to pull up, may reinforce with tape. If the dressing is no longer working, may remove and cover with gauze and tape, but must keep the area dry and clean.  Call with any questions or concerns.   Change dressing on IV access line weekly and PRN   Complete by: As directed    Constipation Prevention   Complete by: As directed    Drink plenty of fluids.  Prune juice may be helpful.  You may use a stool softener, such as Colace (over the counter) 100 mg twice a day.  Use MiraLax (over the counter) for constipation as needed.   Diet - low sodium heart healthy   Complete by: As directed    Flush IV access with Sodium Chloride 0.9% and Heparin 10 units/ml or 100 units/ml   Complete by: As directed    Home infusion instructions - [facility]   Complete by: As directed    Instructions: Flush IV access  with Sodium Chloride 0.9% and Heparin 10units/ml or 100units/ml   Change dressing on IV access line: Weekly and PRN   Instructions Cath Flo 5m: Administer for PICC Line occlusion and as ordered by physician for other access device   [facility] pharmacist to adjust dose for: Vancomycin, Aminoglycosides and other anti-infective therapies as requested by physician   Increase activity slowly as tolerated   Complete by: As directed    Weight bearing as tolerated with assist device (walker, cane,  etc) as directed, use it as long as suggested by your surgeon or therapist, typically at least 4-6 weeks.   Method of administration may be changed at the discretion of home infusion pharmacist based upon assessment of the patient and/or caregiver's ability to self-administer the medication ordered   Complete by: As directed    Post-operative opioid taper instructions:   Complete by: As directed    POST-OPERATIVE OPIOID TAPER INSTRUCTIONS: It is important to wean off of your opioid medication as soon as possible. If you do not need pain medication after your surgery it is ok to stop day one. Opioids include: Codeine, Hydrocodone(Norco, Vicodin), Oxycodone(Percocet, oxycontin) and hydromorphone amongst others.  Long term and even short term use of opiods can cause: Increased pain response Dependence Constipation Depression Respiratory depression And more.  Withdrawal symptoms can include Flu like symptoms Nausea, vomiting And more Techniques to manage these symptoms Hydrate well Eat regular healthy meals Stay active Use relaxation techniques(deep breathing, meditating, yoga) Do Not substitute Alcohol to help with tapering If you have been on opioids for less than two weeks and do not have pain than it is ok to stop all together.  Plan to wean off of opioids This plan should start within one week post op of your joint replacement. Maintain the same interval or time between taking each dose and first decrease the dose.  Cut the total daily intake of opioids by one tablet each day Next start to increase the time between doses. The last dose that should be eliminated is the evening dose.      TED hose   Complete by: As directed    Use stockings (TED hose) for 2 weeks on both leg(s).  You may remove them at night for sleeping.      Allergies as of 07/30/2021       Reactions   Escitalopram Oxalate Anxiety        Medication List     STOP taking these medications     acetaminophen 325 MG tablet Commonly known as: TYLENOL   aspirin 81 MG chewable tablet   docusate sodium 100 MG capsule Commonly known as: COLACE   oxyCODONE 5 MG immediate release tablet Commonly known as: Oxy IR/ROXICODONE       TAKE these medications    albuterol 108 (90 Base) MCG/ACT inhaler Commonly known as: VENTOLIN HFA Inhale 2 puffs into the lungs every 6 (six) hours as needed for wheezing or shortness of breath.   augmented betamethasone dipropionate 0.05 % cream Commonly known as: DIPROLENE-AF Apply 1 application topically 2 (two) times daily as needed (eczema).   carvedilol 25 MG tablet Commonly known as: COREG Take 1 tablet (25 mg total) by mouth 2 (two) times daily.   cefTRIAXone  IVPB Commonly known as: ROCEPHIN Inject 2 g into the vein daily. Indication:  R-hip PJI First Dose: Yes Last Day of Therapy:  09/07/21 Labs - Once weekly:  CBC/D and BMP, Labs - Every other week:  ESR  and CRP Method of administration: IV Push Method of administration may be changed at the discretion of home infusion pharmacist based upon assessment of the patient and/or caregiver's ability to self-administer the medication ordered. What changed: additional instructions   daptomycin  IVPB Commonly known as: CUBICIN Inject 750 mg into the vein daily. Indication:  R-hip PJI First Dose: Yes Last Day of Therapy:  09/07/21 Labs - Once weekly:  CBC/D, BMP, and CPK Labs - Every other week:  ESR and CRP Method of administration: IV Push Method of administration may be changed at the discretion of home infusion pharmacist based upon assessment of the patient and/or caregiver's ability to self-administer the medication ordered. What changed: additional instructions   DULoxetine 60 MG capsule Commonly known as: CYMBALTA Take 60 mg by mouth daily.   ferrous sulfate 325 (65 FE) MG tablet Take 1 tablet (325 mg total) by mouth 3 (three) times daily after meals for 14 days.    fluticasone 50 MCG/ACT nasal spray Commonly known as: FLONASE Place 2 sprays into both nostrils in the morning and at bedtime.   HYDROcodone-acetaminophen 10-325 MG tablet Commonly known as: Norco Take 1 tablet by mouth every 4 (four) hours as needed for severe pain.   Ipratropium-Albuterol 20-100 MCG/ACT Aers respimat Commonly known as: COMBIVENT Inhale 1 puff into the lungs in the morning and at bedtime.   MENS MULTIVITAMIN PLUS PO Take 1 tablet by mouth daily.   methocarbamol 500 MG tablet Commonly known as: ROBAXIN Take 1 tablet (500 mg total) by mouth every 6 (six) hours as needed for muscle spasms.   pantoprazole 40 MG tablet Commonly known as: PROTONIX Take 40 mg by mouth daily.   polyethylene glycol 17 g packet Commonly known as: MIRALAX / GLYCOLAX Take 17 g by mouth daily as needed for mild constipation. What changed: Another medication with the same name was removed. Continue taking this medication, and follow the directions you see here.   pregabalin 150 MG capsule Commonly known as: LYRICA Take 150 mg by mouth 2 (two) times daily.   Refresh Tears 0.5 % Soln Generic drug: carboxymethylcellulose Place 1 drop into both eyes 3 (three) times daily as needed (dry eyes).   rosuvastatin 20 MG tablet Commonly known as: CRESTOR Take 1 tablet (20 mg total) by mouth daily.   tranexamic acid 650 MG Tabs tablet Commonly known as: LYSTEDA Take 3 tablets (1,950 mg total) by mouth 2 (two) times daily for 14 days.               Discharge Care Instructions  (From admission, onward)           Start     Ordered   07/30/21 0000  Change dressing       Comments: Maintain surgical dressing until follow up in the clinic. If the edges start to pull up, may reinforce with tape. If the dressing is no longer working, may remove and cover with gauze and tape, but must keep the area dry and clean.  Call with any questions or concerns.   07/30/21 0850   07/28/21 0000   Change dressing on IV access line weekly and PRN  (Home infusion instructions - [facility] )        07/28/21 1812             Signed: Griffith Citron, PA-C Orthopedic Surgery 08/12/2021, 7:04 AM

## 2021-08-14 ENCOUNTER — Emergency Department (HOSPITAL_COMMUNITY)
Admission: EM | Admit: 2021-08-14 | Discharge: 2021-08-14 | Disposition: A | Discharge status: Home / Self Care | Payer: No Typology Code available for payment source | Attending: Emergency Medicine | Admitting: Emergency Medicine

## 2021-08-14 ENCOUNTER — Encounter (HOSPITAL_COMMUNITY): Payer: Self-pay | Admitting: Emergency Medicine

## 2021-08-14 ENCOUNTER — Emergency Department (HOSPITAL_COMMUNITY): Payer: No Typology Code available for payment source

## 2021-08-14 DIAGNOSIS — L235 Allergic contact dermatitis due to other chemical products: Secondary | ICD-10-CM | POA: Insufficient documentation

## 2021-08-14 DIAGNOSIS — R21 Rash and other nonspecific skin eruption: Secondary | ICD-10-CM | POA: Diagnosis present

## 2021-08-14 LAB — CBC WITH DIFFERENTIAL/PLATELET
Abs Immature Granulocytes: 0.01 10*3/uL (ref 0.00–0.07)
Basophils Absolute: 0.1 10*3/uL (ref 0.0–0.1)
Basophils Relative: 1 %
Eosinophils Absolute: 0.8 10*3/uL — ABNORMAL HIGH (ref 0.0–0.5)
Eosinophils Relative: 12 %
HCT: 33.3 % — ABNORMAL LOW (ref 39.0–52.0)
Hemoglobin: 10.7 g/dL — ABNORMAL LOW (ref 13.0–17.0)
Immature Granulocytes: 0 %
Lymphocytes Relative: 23 %
Lymphs Abs: 1.5 10*3/uL (ref 0.7–4.0)
MCH: 28.6 pg (ref 26.0–34.0)
MCHC: 32.1 g/dL (ref 30.0–36.0)
MCV: 89 fL (ref 80.0–100.0)
Monocytes Absolute: 0.6 10*3/uL (ref 0.1–1.0)
Monocytes Relative: 10 %
Neutro Abs: 3.4 10*3/uL (ref 1.7–7.7)
Neutrophils Relative %: 54 %
Platelets: 319 10*3/uL (ref 150–400)
RBC: 3.74 MIL/uL — ABNORMAL LOW (ref 4.22–5.81)
RDW: 14.7 % (ref 11.5–15.5)
WBC: 6.3 10*3/uL (ref 4.0–10.5)
nRBC: 0 % (ref 0.0–0.2)

## 2021-08-14 LAB — COMPREHENSIVE METABOLIC PANEL
ALT: 19 U/L (ref 0–44)
AST: 22 U/L (ref 15–41)
Albumin: 3.4 g/dL — ABNORMAL LOW (ref 3.5–5.0)
Alkaline Phosphatase: 97 U/L (ref 38–126)
Anion gap: 9 (ref 5–15)
BUN: 8 mg/dL (ref 6–20)
CO2: 25 mmol/L (ref 22–32)
Calcium: 9.1 mg/dL (ref 8.9–10.3)
Chloride: 104 mmol/L (ref 98–111)
Creatinine, Ser: 0.81 mg/dL (ref 0.61–1.24)
GFR, Estimated: >60 mL/min (ref >60)
Glucose, Bld: 91 mg/dL (ref 70–99)
Potassium: 4.2 mmol/L (ref 3.5–5.1)
Sodium: 138 mmol/L (ref 135–145)
Total Bilirubin: 0.3 mg/dL (ref 0.3–1.2)
Total Protein: 7.1 g/dL (ref 6.5–8.1)

## 2021-08-14 NOTE — ED Provider Notes (Signed)
[facility] DEPT Provider Note   CSN: [id number] Arrival date & time: 08/14/21  1238     History  Chief Complaint  Patient presents with   Vascular Access Problem    Reginald Walsh is a 56 y.o. male.  He has been getting IV antibiotics for right hip infection for the last 3 weeks.  Since yesterday he has had increased itching and discomfort of the right upper arm where the PICC rests.  He has been getting ceftriaxone and daptomycin and is followed with infectious disease.  He is post to get another 3 weeks of IV antibiotics.  No fevers but has felt cold.  No nausea or vomiting.  Tried some Benadryl for the itching with some improvement.  The history is provided by the patient.  Rash Location:  Shoulder/arm Shoulder/arm rash location:  R upper arm Quality: itchiness, painful, redness and weeping   Pain details:    Severity:  No pain   Onset quality:  Gradual   Duration:  2 days Onset quality:  Gradual Duration:  2 days Timing:  Constant Progression:  Worsening Chronicity:  New Context: chemical exposure and medications   Relieved by:  Nothing Worsened by:  Nothing Ineffective treatments:  Antihistamines Associated symptoms: no abdominal pain, no fever, no nausea, no shortness of breath, no throat swelling and not vomiting        Home Medications Prior to Admission medications   Medication Sig Start Date End Date Taking? Authorizing Provider  albuterol (VENTOLIN HFA) 108 (90 Base) MCG/ACT inhaler Inhale 2 puffs into the lungs every 6 (six) hours as needed for wheezing or shortness of breath.    [provider]  augmented betamethasone dipropionate (DIPROLENE-AF) 0.05 % cream Apply 1 application topically 2 (two) times daily as needed (eczema).    [provider]  carvedilol (COREG) 25 MG tablet Take 1 tablet (25 mg total) by mouth 2 (two) times daily. 06/26/21 06/21/22  Loel Dubonnet, NP  cefTRIAXone (ROCEPHIN) IVPB Inject 2  g into the vein daily. Indication:  R-hip PJI First Dose: Yes Last Day of Therapy:  09/07/21 Labs - Once weekly:  CBC/D and BMP, Labs - Every other week:  ESR and CRP Method of administration: IV Push Method of administration may be changed at the discretion of home infusion pharmacist based upon assessment of the patient and/or caregiver's ability to self-administer the medication ordered. 07/28/21 09/07/21  Irving Copas, PA-C  daptomycin (CUBICIN) IVPB Inject 750 mg into the vein daily. Indication:  R-hip PJI First Dose: Yes Last Day of Therapy:  09/07/21 Labs - Once weekly:  CBC/D, BMP, and CPK Labs - Every other week:  ESR and CRP Method of administration: IV Push Method of administration may be changed at the discretion of home infusion pharmacist based upon assessment of the patient and/or caregiver's ability to self-administer the medication ordered. 07/28/21 09/07/21  Irving Copas, PA-C  DULoxetine (CYMBALTA) 60 MG capsule Take 60 mg by mouth daily.    [provider]  ferrous sulfate 325 (65 FE) MG tablet Take 1 tablet (325 mg total) by mouth 3 (three) times daily after meals for 14 days. 07/10/21 07/24/21  Irving Copas, PA-C  fluticasone (FLONASE) 50 MCG/ACT nasal spray Place 2 sprays into both nostrils in the morning and at bedtime. 07/01/21   Jeanie Sewer, NP  HYDROcodone-acetaminophen (NORCO) 10-325 MG tablet Take 1 tablet by mouth every 4 (four) hours as needed for severe pain. 07/30/21  Costella Hatcher R, PA-C  Ipratropium-Albuterol (COMBIVENT) 20-100 MCG/ACT AERS respimat Inhale 1 puff into the lungs in the morning and at bedtime.    [provider]  methocarbamol (ROBAXIN) 500 MG tablet Take 1 tablet (500 mg total) by mouth every 6 (six) hours as needed for muscle spasms. 07/10/21   Irving Copas, PA-C  Multiple Vitamins-Minerals (MENS MULTIVITAMIN PLUS PO) Take 1 tablet by mouth daily.     [provider]  pantoprazole (PROTONIX) 40 MG  tablet Take 40 mg by mouth daily.    [provider]  polyethylene glycol (MIRALAX / GLYCOLAX) 17 g packet Take 17 g by mouth daily as needed for mild constipation. 07/10/21   Irving Copas, PA-C  pregabalin (LYRICA) 150 MG capsule Take 150 mg by mouth 2 (two) times daily.     [provider]  REFRESH TEARS 0.5 % SOLN Place 1 drop into both eyes 3 (three) times daily as needed (dry eyes). 07/16/21   [provider]  rosuvastatin (CRESTOR) 20 MG tablet Take 1 tablet (20 mg total) by mouth daily. 01/28/21   Loel Dubonnet, NP  sertraline (ZOLOFT) 50 MG tablet Take 100 mg by mouth daily.   02/14/20  [provider]  topiramate (TOPAMAX) 25 MG tablet Take 1 tablet (25 mg total) by mouth 2 (two) times daily. Start one at night for 4 days and then start taking one twice a day 01/18/20 02/14/20  [name], MD      Allergies    Escitalopram oxalate    Review of Systems   Review of Systems  Constitutional:  Negative for fever.  Respiratory:  Negative for shortness of breath.   Gastrointestinal:  Negative for abdominal pain, nausea and vomiting.  Skin:  Positive for rash and wound.    Physical Exam Updated Vital Signs BP 129/89 (BP Location: Left Arm)   Pulse 73   Temp 98 F (36.7 C) (Oral)   Resp 19   SpO2 97%  Physical Exam Vitals and nursing note reviewed.  Constitutional:      General: He is not in acute distress.    Appearance: Normal appearance. He is well-developed.  HENT:     Head: Normocephalic and atraumatic.  Eyes:     Conjunctiva/sclera: Conjunctivae normal.  Cardiovascular:     Rate and Rhythm: Normal rate and regular rhythm.     Heart sounds: No murmur heard. Pulmonary:     Effort: Pulmonary effort is normal. No respiratory distress.     Breath sounds: Normal breath sounds.  Abdominal:     Palpations: Abdomen is soft.     Tenderness: There is no abdominal tenderness. There is no guarding or rebound.  Musculoskeletal:         General: Swelling and tenderness present. Normal range of motion.     Cervical back: Neck supple.     Comments: He has errythema and some weeping areas right upper arm at site of PICC line.  PICC will be removed.  Skin:    General: Skin is warm and dry.     Capillary Refill: Capillary refill takes less than 2 seconds.     Findings: Rash present.  Neurological:     General: No focal deficit present.     Mental Status: He is alert.      ED Results / Procedures / Treatments   Labs (all labs ordered are listed, but only abnormal results are displayed) Labs Reviewed  COMPREHENSIVE METABOLIC PANEL - Abnormal; Notable  for the following components:      Result Value   Albumin 3.4 (*)    All other components within normal limits  CBC WITH DIFFERENTIAL/PLATELET - Abnormal; Notable for the following components:   RBC 3.74 (*)    Hemoglobin 10.7 (*)    HCT 33.3 (*)    Eosinophils Absolute 0.8 (*)    All other components within normal limits    EKG None  Radiology DG Chest Port 1 View  Result Date: 08/14/2021 CLINICAL DATA:  Redness, itching, and drainage from PICC area. EXAM: PORTABLE CHEST 1 VIEW COMPARISON:  Chest CTA 06/17/2021 FINDINGS: A right upper extremity PICC terminates over the lower SVC. The cardiomediastinal silhouette is within normal limits for portable AP technique and mildly low lung volumes. No airspace consolidation, edema, sizable pleural effusion, or pneumothorax is identified. No acute osseous abnormality is seen. IMPRESSION: No active disease. Electronically Signed   By: Logan Bores M.D.   On: 08/14/2021 13:19    Procedures Procedures    Medications Ordered in ED Medications - No data to display  ED Course/ Medical Decision Making/ A&P Clinical Course as of 08/14/21 1842  Thu Aug 14, 2021  1444 IV team is recommending line holiday for 2 days.  They are working on establishing a midline for the other side so patient can continue his antibiotics. [MB]  9937  I did reach out to Dr. Novella Olive infectious disease who said that was fine for the midline and he only needed a few more weeks. [MB]    Clinical Course User Index [MB] Hayden Rasmussen, MD                           Medical Decision Making  This patient complains of itchiness redness right upper arm; this involves an extensive number of treatment Options and is a complaint that carries with it a high risk of complications and morbidity. The differential includes infection, allergic reaction, contact dermatitis  I ordered, reviewed and interpreted labs, which included CBC with normal white count, hemoglobin better than priors, chemistries and LFTs normal  I ordered imaging studies which included chest x-ray and I independently    visualized and interpreted imaging which showed line in good position Additional history obtained from patient's wife Previous records obtained and reviewed in epic including prior orthopedic admission I consulted IV team and discussed lab and imaging findings and discussed disposition.  Cardiac monitoring reviewed, normal sinus rhythm Social determinants considered, no significant barriers Critical Interventions: None  After the interventions stated above, I reevaluated the patient and found patient be comfortable appearing nontoxic Admission and further testing considered, no indications for admission at this time.  He had a midline placed in his left arm so he can continue his IV antibiotics.  I reached out to infectious disease and updated them on his status.  He is comfortable plan for discharge and symptomatic treatment with topical steroids for his itchiness arm.  Return instructions discussed         Final Clinical Impression(s) / ED Diagnoses Final diagnoses:  Allergic dermatitis due to other chemical product    Rx / DC Orders ED Discharge Orders     None         Hayden Rasmussen, MD 08/14/21 [phone]

## 2021-08-14 NOTE — Progress Notes (Signed)
At bedside to assess PICC line placed 06/2021 for home OPAT. Upon assessment, site is extremely red with multiple blistering noted. Small amount of cream/yellow colored drainage noted at  and aroundnsertion site. No active drainage or bleeding noted. Afebrile. Pt and wife report [facility] has been caring for the dressing, and the last dressing change was 7/17 and again on 7/19. Pt reports discomfort with cleaning. Site allowed to air dry thoroughly. Dr. Melina Copa at bedside and ordered a PICC removal. MD agreed a midline placement for home OPAT will suffice at this time, as a 48 hour line holiday is warranted. Pt and wife demonstrate understanding of care and maintenance of the line at home. Midline order placed.

## 2021-08-14 NOTE — ED Triage Notes (Signed)
Patient reporting oozing and bleeding around picc line site today.

## 2021-08-14 NOTE — Discharge Instructions (Signed)
You were seen in the emergency department for some itching and redness where your left arm PICC line was placed.  The line was removed and a new line was placed on your left side.  You can continue Benadryl for itching and can apply some topical hydrocortisone cream.  Continue your antibiotics.  Follow-up with infectious disease as scheduled.  Return if any worsening or concerning symptoms

## 2021-08-14 NOTE — ED Provider Triage Note (Signed)
Emergency Medicine Provider Triage Evaluation Note  Reginald Walsh , a 56 y.o. male  was evaluated in triage.  Pt complains of redness, itching, serous drainage from PICC line area.  Patient states symptoms began yesterday.  He has tried Benadryl at home with no relief of symptoms.  Denies purulent drainage from said site, fever or other bodily complaint.  He has been receiving IV antibiotics due to complications with right hip surgery.  Review of Systems  Positive: See above Negative:   Physical Exam  BP 129/89 (BP Location: Left Arm)   Pulse 73   Temp 98 F (36.7 C) (Oral)   Resp 19   SpO2 97%  Gen:   Awake, no distress   Resp:  Normal effort  MSK:   Moves extremities without difficulty  Other:  Surrounding redness around PICC line with serous and bloody drainage.  Incision site on right hip with mild surrounding erythema and serous drainage.  Medical Decision Making  Medically screening exam initiated at 1:03 PM.  Appropriate orders placed.  Reginald Walsh was informed that the remainder of the evaluation will be completed by another provider, this initial triage assessment does not replace that evaluation, and the importance of remaining in the ED until their evaluation is complete.     Wilnette Kales, [place] 08/14/21 [phone]

## 2021-08-14 NOTE — ED Notes (Signed)
Pt A&OX4 ambulatory at d/c with independent steady gait. Pt verbalized understanding of d/c instructions and follow up care. 

## 2021-08-14 NOTE — Progress Notes (Signed)

## 2021-08-18 ENCOUNTER — Encounter (INDEPENDENT_AMBULATORY_CARE_PROVIDER_SITE_OTHER): Payer: Managed Care, Other (non HMO) | Admitting: Family

## 2021-08-18 DIAGNOSIS — J4530 Mild persistent asthma, uncomplicated: Secondary | ICD-10-CM

## 2021-08-18 DIAGNOSIS — J449 Chronic obstructive pulmonary disease, unspecified: Secondary | ICD-10-CM

## 2021-08-18 DIAGNOSIS — I7789 Other specified disorders of arteries and arterioles: Secondary | ICD-10-CM

## 2021-08-18 DIAGNOSIS — J301 Allergic rhinitis due to pollen: Secondary | ICD-10-CM

## 2021-08-18 DIAGNOSIS — L309 Dermatitis, unspecified: Secondary | ICD-10-CM

## 2021-08-18 MED ORDER — HYDROCORTISONE 2.5 % EX CREA: TOPICAL_CREAM | Freq: Two times a day (BID) | CUTANEOUS | 3 refills | Status: AC

## 2021-08-18 MED ORDER — MONTELUKAST SODIUM 10 MG PO TABS: 10.0000 mg | ORAL_TABLET | Freq: Every day | ORAL | 1 refills | Status: DC

## 2021-08-18 NOTE — Telephone Encounter (Signed)
Please see the MyChart message reply(ies) for my assessment and plan.  The patient gave consent for this Medical Advice Message and is aware that it may result in a bill to their insurance company as well as the possibility that this may result in a co-payment or deductible. They are an established patient, but are not seeking medical advice exclusively about a problem treated during an in person or video visit in the last 7 days. I did not recommend an in person or video visit within 7 days of my reply.  I spent a total of 15 minutes cumulative time within 7 days through [place], NP

## 2021-08-20 ENCOUNTER — Ambulatory Visit (INDEPENDENT_AMBULATORY_CARE_PROVIDER_SITE_OTHER): Payer: Managed Care, Other (non HMO) | Admitting: Infectious Diseases

## 2021-08-20 ENCOUNTER — Other Ambulatory Visit: Payer: Self-pay

## 2021-08-20 ENCOUNTER — Telehealth: Payer: Self-pay

## 2021-08-20 ENCOUNTER — Encounter: Payer: Self-pay | Admitting: Infectious Diseases

## 2021-08-20 ENCOUNTER — Other Ambulatory Visit: Payer: Self-pay | Admitting: Infectious Diseases

## 2021-08-20 VITALS — BP 101/59 | HR 73 | Temp 97.6°F | Wt 246.0 lb

## 2021-08-20 DIAGNOSIS — T8451XA Infection and inflammatory reaction due to internal right hip prosthesis, initial encounter: Secondary | ICD-10-CM

## 2021-08-20 DIAGNOSIS — Z452 Encounter for adjustment and management of vascular access device: Secondary | ICD-10-CM

## 2021-08-20 DIAGNOSIS — Z79899 Other long term (current) drug therapy: Secondary | ICD-10-CM | POA: Insufficient documentation

## 2021-08-20 DIAGNOSIS — B379 Candidiasis, unspecified: Secondary | ICD-10-CM | POA: Diagnosis not present

## 2021-08-20 MED ORDER — DOXYCYCLINE HYCLATE 100 MG PO TABS: 100.0000 mg | ORAL_TABLET | Freq: Two times a day (BID) | ORAL | 5 refills | Status: DC

## 2021-08-20 MED ORDER — CEFADROXIL 500 MG PO CAPS: 1000.0000 mg | ORAL_CAPSULE | Freq: Two times a day (BID) | ORAL | 5 refills | Status: DC

## 2021-08-20 MED ORDER — FLUCONAZOLE 150 MG PO TABS: 150.0000 mg | ORAL_TABLET | Freq: Every day | ORAL | 2 refills | Status: DC

## 2021-08-20 MED ORDER — FLUCONAZOLE 150 MG PO TABS: 150.0000 mg | ORAL_TABLET | ORAL | 0 refills | Status: DC

## 2021-08-20 NOTE — Telephone Encounter (Signed)
Please see highlighted message from pharmacy regarding clarification, thank you.

## 2021-08-20 NOTE — Telephone Encounter (Signed)
Received call from Inez Catalina, nurse with [facility]. She states she received a call from patient requesting PICC line dressing change. She states that when she arrived, the patient had a midline and it was completely out of his arm. Confirmed dressing is in place over insertion site.   Will route to provider.   Beryle Flock, RN

## 2021-08-20 NOTE — Telephone Encounter (Signed)
Called patient to discuss midline. He states the CHG patch over the insertion site ruptured last night and the site has been irritated ever since. He reports that the midline has been difficult to flush as well. He called his home health nurse to complete a dressing change.   Reginald Walsh states that he himself pulled out the midline. Denies any swelling, pain, or drainage in the affected arm. Denies fever or chills.   Discussed with Cassie [employer], [employer]. Explained that replacement of line would have to be done outpatient and our office will contact him tomorrow morning with next steps.   Discussed signs and symptoms to be aware of. Advised him if he develops any of the following he should present to the emergency department:  - swelling or pain in affected arm, numbness or tingling, shortness of breath, difficulty breathing, sharp pain, fever, chills. Patient verbalized understanding and has no further questions.   Beryle Flock, RN

## 2021-08-20 NOTE — Progress Notes (Signed)
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   Other and unspecified noninfectious gastroenteritis and colitis 07/01/2021   Other osteonecrosis, 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[place]) 07/01/2021   Presbyopia 07/01/2021   Thoracic or lumbosacral neuritis or radiculitis 07/01/2021   Tobacco use disorder 07/01/2021   Vitamin D deficiency 07/01/2021   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   History of total left hip arthroplasty 07/01/2021   Hyponatremia 04/21/2021   Hypomagnesemia 04/21/2021   Hypokalemia 04/21/2021   Chronic post-traumatic stress disorder 04/21/2021   Chronic obstructive pulmonary disease, unspecified ([place]) 04/21/2021   Gastroesophageal reflux disease 04/21/2021   Major depressive disorder, recurrent, moderate ([place]) 04/21/2021   Hyperlipidemia 04/21/2021   Cervical spondylosis 12/06/2019   Essential hypertension 06/02/2019   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HCC) 06/02/2019   Cervical radiculopathy 10/21/2016   Chronic back pain 04/21/2016    Patient's Medications  New Prescriptions   CEFADROXIL (DURICEF) 500 MG CAPSULE    Take 2 capsules (1,000 mg total) by mouth 2 (two) times daily.   DOXYCYCLINE (VIBRA-TABS) 100 MG TABLET    Take 1 tablet (100 mg total) by mouth 2 (two) times daily.   FLUCONAZOLE (DIFLUCAN) 150 MG TABLET    Take 1 tablet (150 mg total) by mouth once a week. Fluconazole once every 3 days , up to 3 doses.   Previous Medications   ALBUTEROL (VENTOLIN HFA) 108 (90 BASE) MCG/ACT INHALER    Inhale 2 puffs into the lungs every 6 (six) hours as needed for wheezing or shortness of breath.   AUGMENTED BETAMETHASONE DIPROPIONATE (DIPROLENE-AF) 0.05 % CREAM    Apply 1 application topically 2 (two) times daily as needed (eczema).   CARVEDILOL (COREG) 25 MG TABLET    Take 1 tablet (25 mg total) by mouth 2 (two) times daily.   CEFTRIAXONE (ROCEPHIN) IVPB    Inject 2 g into the vein daily. Indication:  R-hip PJI First Dose: Yes Last Day of Therapy:  09/07/21 Labs - Once weekly:  CBC/D and BMP, Labs - Every other week:  ESR and CRP Method of administration: IV Push Method of administration may be changed at the discretion of home infusion pharmacist based upon assessment of the patient and/or caregiver's ability to self-administer the medication ordered.   DAPTOMYCIN (CUBICIN) IVPB    Inject 750 mg into the vein daily. Indication:  R-hip PJI First Dose: Yes Last Day of Therapy:  09/07/21 Labs - Once weekly:  CBC/D, BMP, and CPK Labs - Every other week:  ESR and CRP Method of administration: IV Push Method of administration may be changed at the discretion of home infusion pharmacist based upon assessment of the patient and/or caregiver's ability to self-administer the medication ordered.   DULOXETINE (CYMBALTA) 60 MG CAPSULE    Take 60 mg by mouth daily.   FERROUS SULFATE 325 (65 FE) MG TABLET    Take 1 tablet (325 mg total) by mouth 3 (three) times daily  after meals for 14 days.   FLUTICASONE (FLONASE) 50 MCG/ACT NASAL SPRAY    Place 2 sprays into both nostrils in the morning and at bedtime.   HYDROCODONE-ACETAMINOPHEN (NORCO) 10-325 MG TABLET    Take 1 tablet by mouth every 4 (four) hours as needed for severe pain.   HYDROCORTISONE 2.5 % CREAM    Apply topically 2 (two) times daily.   IPRATROPIUM-ALBUTEROL (COMBIVENT) 20-100 MCG/ACT AERS RESPIMAT    Inhale 1 puff into the lungs in the morning and at bedtime.    METHOCARBAMOL (ROBAXIN) 500 MG TABLET    Take 1 tablet (500 mg total) by mouth every 6 (six) hours as needed for muscle spasms.   MONTELUKAST (SINGULAIR) 10 MG TABLET    Take 1 tablet (10 mg total) by mouth daily.   MULTIPLE VITAMINS-MINERALS (MENS MULTIVITAMIN PLUS PO)    Take 1 tablet by mouth daily.    PANTOPRAZOLE (PROTONIX) 40 MG TABLET    Take 40 mg by mouth daily.   POLYETHYLENE GLYCOL (MIRALAX / GLYCOLAX) 17 G PACKET    Take 17 g by mouth daily as needed for mild constipation.   PREGABALIN (LYRICA) 150 MG CAPSULE    Take 150 mg by mouth 2 (two) times daily.    REFRESH TEARS 0.5 % SOLN    Place 1 drop into both eyes 3 (three) times daily as needed (dry eyes).   ROSUVASTATIN (CRESTOR) 20 MG TABLET    Take 1 tablet (20 mg total) by mouth daily.  Modified Medications   No medications on file  Discontinued Medications   No medications on file    Subjective: 56 year old male with PMH of COPD, hypertension, lumbar laminectomy, left hip arthroplasty in 2021 and right hip arthroplasty in April 14 2021 who is status post operative excisional and nonexcisional debridement of right hip on 1/61 complicated by postoperative hematoma with wound dehiscence requiring repeat excisional and nonexcisional debridement of right hip wound and evacuation of right hip hematoma with primary wound closure on 7/2 who is here for hospital follow-up for management of long-term antibiotics.   Patient's initial postoperative course after rt hip arthroplasty  was complicated by cellulitis and was treated with oral cephalexin which initially improved but recurred after 4 weeks of stopping antibiotics.  Hence, he was taken to the OR on 6/13.  OR cultures no growth.   Patient was seen in the ED on 7/20 due to increased itching and discomfort of the right upper arm where PICC line present.  PICC line was removed and a midline was placed in the left arm for continuation of IV antibiotics.  IV antibiotics was extended to  09/07/2021 per Dr Linus Salmons  Today's Visit  Patient is accompanied by a family member.  He has been getting IV daptomycin and IV ceftriaxone through his left arm midline.  Complains of some itching and irritation at the midline site but no pain, tenderness or swelling.  Dressing was changed last on Monday.  Denies any fever, chills or nausea, vomiting or diarrhea.  The wound at the right hip has on and off minimal serosanguineous type drainage.  He is being closely followed by orthopedics.  Also complains of itching and irritation at the right inguinal region with concerns for possible fungal infection.   Review of Systems: All systems reviewed with pertinent positive and negative as listed above  Past Medical History:  Diagnosis Date   Abnormal stress test    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[place]) 03/2019   40 mm by  echo   Chest pain of uncertain etiology    Normal coronaries after abnormal Myoview Feb 2021 Echo March 2021 showed normal LVF- mild LVH    Chest pain of unknown etiology 02/2019   normal coronaries after abnormal Myoview   COPD (chronic obstructive pulmonary disease) ([place])    Dyspnea    Encounter for screening for COVID-19 07/01/2021   Generalized enlarged lymph nodes 07/01/2021   Hypertension    normal RA dopplers   Hyponatremia    Other allergy status, other than to drugs and biological substances 07/01/2021   Other and unspecified complications of medical care, not elsewhere classified 07/01/2021   Other ill-defined and unknown causes of morbidity and mortality 07/01/2021   Overweight    Pain in left knee 07/01/2021   Pain in right foot 07/01/2021   Past Surgical History:  Procedure Laterality Date   HEMATOMA EVACUATION Right 07/27/2021   Procedure: EVACUATION HEMATOMA;  Surgeon: Paralee Cancel, MD;  Location: [facility];  Service: Orthopedics;  Laterality: Right;   HIP ARTHROPLASTY Left 2021   HIP ARTHROPLASTY Right 2023   INCISION AND DRAINAGE Right 07/27/2021   Procedure:  INCISIONAL AND NON-INCISIONAL WOUND DEBRIDEMENT AND PRIMARY WOUND CLOSURE;  Surgeon: Paralee Cancel, MD;  Location: [facility];  Service: Orthopedics;  Laterality: Right;   INCISION AND DRAINAGE HIP Right 07/08/2021   Procedure: EXCISIONAL AND NON EXCISIONAL DEBRIDEMENT HIP;  Surgeon: Paralee Cancel, MD;  Location: [facility];  Service: Orthopedics;  Laterality: Right;   KNEE ARTHROSCOPY Left 2008   LEFT HEART CATH AND CORONARY ANGIOGRAPHY N/A 03/21/2019   Procedure: LEFT HEART CATH AND CORONARY ANGIOGRAPHY;  Surgeon: Burnell Blanks, MD;  Location: [facility];  Service: Cardiovascular;  Laterality: N/A;   LUMBAR LAMINECTOMY  2000   TENDON RELEASE Right 2018   foot   WISDOM TOOTH EXTRACTION     age 27    Social History   Tobacco Use   Smoking status: Former    Packs/day: 0.50    Years: 7.00    Total pack years: 3.50    Types: Cigarettes    Quit date: 1989    Years since quitting: 34.5   Smokeless tobacco: Current    Types: Snuff, Chew  Vaping Use   Vaping Use: Never used  Substance Use Topics   Alcohol use: Yes    Alcohol/week: 1.0 standard drink of alcohol    Types: 1 Cans of beer per week    Comment: occasional   Drug use: Never    Family History  Adopted: Yes  Problem Relation Age of Onset   Lung cancer Father    Prostate cancer Father        Marena Chancy of age of onset    Allergies  Allergen Reactions   Escitalopram Oxalate Anxiety    Health Maintenance  Topic Date Due   Hepatitis C Screening  Never done   COLONOSCOPY (Pts 45-20yr Insurance coverage will need to be confirmed)  Never done   INFLUENZA VACCINE  08/26/2021   TETANUS/TDAP  03/02/2027   HIV Screening  Completed   Zoster Vaccines- Shingrix  Completed   HPV VACCINES  Aged Out   COVID-19 Vaccine  Discontinued    Objective:  Vitals:   08/20/21 1129  BP: (!) 101/59  Pulse: 73  Temp: 97.6 F (36.4 C)  TempSrc: Oral  Weight: 246 lb (111.6 kg)   Body mass index is 30.75 kg/m.  Physical  Exam Constitutional:      Appearance: Normal appearance.  HENT:     Head: Normocephalic and atraumatic.      Mouth: Mucous membranes are moist.  Eyes:    Conjunctiva/sclera: Conjunctivae normal.     Pupils:   Cardiovascular:     Rate and Rhythm: Normal rate and regular rhythm.     Heart sounds:  Pulmonary:     Effort: Pulmonary effort is normal.     Breath sounds: Normal breath sounds.   Abdominal:     General: Non distended     Palpations: soft.   Musculoskeletal:        General: Normal range of motion.  RT HIP    Skin:    General: Skin is warm and dry.     Comments: Rt inguinal region with scratch marks and erythema   Neurological:     General: grossly non focal     Mental Status: awake, alert and oriented to person, place, and time.   Psychiatric:        Mood and Affect: Mood normal.   Lab Results Lab Results  Component Value Date   WBC 6.3 08/14/2021   HGB 10.7 (L) 08/14/2021   HCT 33.3 (L) 08/14/2021   MCV 89.0 08/14/2021   PLT 319 08/14/2021    Lab Results  Component Value Date   CREATININE 0.81 08/14/2021   BUN 8 08/14/2021   NA 138 08/14/2021   K 4.2 08/14/2021   CL 104 08/14/2021   CO2 25 08/14/2021    Lab Results  Component Value Date   ALT 19 08/14/2021   AST 22 08/14/2021   ALKPHOS 97 08/14/2021   BILITOT 0.3 08/14/2021    Lab Results  Component Value Date   CHOL 253 (H) 07/02/2020   HDL 70 07/02/2020   LDLCALC 130 (H) 07/02/2020   TRIG 304 (H) 07/02/2020   CHOLHDL 3.6 07/02/2020   No results found for: "LABRPR", "RPRTITER" No results found for: "HIV1RNAQUANT", "HIV1RNAVL", "CD4TABS"   Problem List Items Addressed This Visit       Musculoskeletal and Integument   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 Start doxycycline 100 mg p.o. twice daily and cefadroxil 1 g p.o. twice daily starting 09/08/2021.  Plan to continue for at least 3 months, possibly 6 months Follow-up in a month  Medication monitoring 08/18/21 cr 0.72, ESR 18, CRP 17, CK 61  Midline + Midline to be removed after last dose on 09/07/2021 Will inform a home health nurse about need to change dressing of the midline  Possible candidiasis of right inguinal region Fluconazole 15m every 3 rd day up to 3rd doses  I have personally spent 50  minutes involved in face-to-face and non-face-to-face activities for this patient on the day of the visit. Professional time spent includes the following activities: Preparing to see the patient (review of tests), Obtaining and/or reviewing separately obtained history (admission/discharge record), Performing a medically appropriate examination and/or evaluation , Ordering medications/tests/procedures, referring and communicating with other health care professionals, Documenting clinical information in the EMR, Independently interpreting results (not separately reported), Communicating results to the patient/family/caregiver, Counseling and educating the patient/family/caregiver and Care coordination (not separately  reported).   Wilber Oliphant, [place] [facility] 08/20/2021, 12:45 PM

## 2021-08-21 DIAGNOSIS — Z452 Encounter for adjustment and management of vascular access device: Secondary | ICD-10-CM | POA: Insufficient documentation

## 2021-08-21 NOTE — Telephone Encounter (Signed)
Spoke with Reginald Walsh, he has an appointment to have PICC placed 7/31 at [facility].   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 Advised him not to do this and that our office will try and coordinate with home health to see about having a PIV placed.   Sent message to Carolynn Sayers, RN to see about having PIV placed until patient can have PICC line inserted on 7/31.  Advised patient to stop oral antibiotics when he resumes IV antibiotics. Patient verbalized understanding and has no further questions.   Beryle Flock, RN

## 2021-08-21 NOTE — Telephone Encounter (Addendum)
Received the following message from Dr. West Bali:   Rosiland Oz, MD  P [facility] Triage Nurse Pool Cc: Darletta Moll, RPH-CPP Team,   Will extend his IV abtx to 8/18 from prior end date of 8/13 to compensate for 5 days worth of missed IV abtx due to losing midline.   Spoke with Amery, advised him that there is no need for peripheral IV placement as he will be taking oral antibiotics until his PICC line is placed on 08/25/21. Advised him he will stop the oral antibiotics when he resumes IV antibiotics and that Dr. West Bali has extended his IV antibiotics to 09/12/21 to make up for loss of midline. Patient verbalized understanding and has no further questions.   Extension orders sent to Carolynn Sayers, RN with [facility]. Also advised Pam to disregard previous message about PIV as Davell has PO antibiotics for the meantime.    Beryle Flock, RN

## 2021-08-25 ENCOUNTER — Ambulatory Visit (HOSPITAL_COMMUNITY)
Admission: RE | Admit: 2021-08-25 | Discharge: 2021-08-25 | Disposition: A | Discharge status: Home / Self Care | Payer: Managed Care, Other (non HMO) | Source: Ambulatory Visit | Attending: Infectious Diseases | Admitting: Infectious Diseases

## 2021-08-25 DIAGNOSIS — Z79899 Other long term (current) drug therapy: Secondary | ICD-10-CM | POA: Insufficient documentation

## 2021-08-25 MED ORDER — HEPARIN SOD (PORK) LOCK FLUSH 100 UNIT/ML IV SOLN
INTRAVENOUS | Status: AC
  Administered 2021-08-25: 500 [IU]
  Filled 2021-08-25: qty 5

## 2021-08-25 MED ORDER — LIDOCAINE HCL 1 % IJ SOLN
INTRAMUSCULAR | Status: AC
  Administered 2021-08-25: 10 mL
  Filled 2021-08-25: qty 20

## 2021-08-25 NOTE — Procedures (Signed)
Successful placement of single lumen PICC line to right basilic vein. Length 48 cm Tip at lower SVC/RA PICC capped No complications Ready for use.  EBL < 5 mL     Narda Rutherford, AGNP-BC 08/25/2021, 2:45 PM

## 2021-08-26 ENCOUNTER — Encounter: Payer: Self-pay | Admitting: Infectious Diseases

## 2021-08-29 ENCOUNTER — Inpatient Hospital Stay: Payer: No Typology Code available for payment source | Admitting: Internal Medicine

## 2021-08-29 ENCOUNTER — Ambulatory Visit (HOSPITAL_COMMUNITY)
Admission: RE | Admit: 2021-08-29 | Discharge: 2021-08-29 | Disposition: A | Discharge status: Home / Self Care | Payer: Managed Care, Other (non HMO) | Source: Ambulatory Visit | Attending: Family | Admitting: Family

## 2021-08-29 DIAGNOSIS — R0602 Shortness of breath: Secondary | ICD-10-CM | POA: Insufficient documentation

## 2021-08-29 DIAGNOSIS — Z6831 Body mass index (BMI) 31.0-31.9, adult: Secondary | ICD-10-CM | POA: Insufficient documentation

## 2021-08-29 LAB — PULMONARY FUNCTION TEST
DL/VA % pred: 91 %
DL/VA: 3.84 ml/min/mmHg/L
DLCO cor % pred: 78 %
DLCO cor: 25.74 ml/min/mmHg
DLCO unc % pred: 68 %
DLCO unc: 22.38 ml/min/mmHg
FEF 25-75 Post: 3.91 L/sec
FEF 25-75 Pre: 2.98 L/sec
FEF2575-%Change-Post: 31 %
FEF2575-%Pred-Post: 108 %
FEF2575-%Pred-Pre: 82 %
FEV1-%Change-Post: 7 %
FEV1-%Pred-Post: 93 %
FEV1-%Pred-Pre: 86 %
FEV1-Post: 4.07 L
FEV1-Pre: 3.77 L
FEV1FVC-%Change-Post: 6 %
FEV1FVC-%Pred-Pre: 100 %
FEV6-%Change-Post: 2 %
FEV6-%Pred-Post: 90 %
FEV6-%Pred-Pre: 88 %
FEV6-Post: 4.98 L
FEV6-Pre: 4.87 L
FEV6FVC-%Change-Post: 1 %
FEV6FVC-%Pred-Post: 103 %
FEV6FVC-%Pred-Pre: 102 %
FVC-%Change-Post: 0 %
FVC-%Pred-Post: 86 %
FVC-%Pred-Pre: 86 %
FVC-Post: 4.98 L
FVC-Pre: 4.94 L
Post FEV1/FVC ratio: 82 %
Post FEV6/FVC ratio: 100 %
Pre FEV1/FVC ratio: 76 %
Pre FEV6/FVC Ratio: 99 %
RV % pred: 80 %
RV: 1.98 L
TLC % pred: 89 %
TLC: 7.17 L

## 2021-08-29 MED ORDER — ALBUTEROL SULFATE (2.5 MG/3ML) 0.083% IN NEBU
2.5000 mg | INHALATION_SOLUTION | Freq: Once | RESPIRATORY_TRACT | Status: AC
  Administered 2021-08-29: 2.5 mg via RESPIRATORY_TRACT

## 2021-09-01 ENCOUNTER — Telehealth (HOSPITAL_BASED_OUTPATIENT_CLINIC_OR_DEPARTMENT_OTHER): Payer: Self-pay

## 2021-09-01 NOTE — Telephone Encounter (Addendum)
Results called to patient who verbalizes understanding! Patient would like to think about seeing Pulmonology, he will let us know.     ----- Message from Loel Dubonnet, NP sent at 09/01/2021  8:11 AM EDT ----- PFTs show that spirometry flows are within lower limits of normal. Mild obstruction in small airways may be present, improved after bronchodilator. As his Combivent is bronchdilator - continue same inhaler. If he still notes worsening dyspnea, may refer to pulmonology.

## 2021-09-02 ENCOUNTER — Telehealth: Payer: Self-pay

## 2021-09-02 NOTE — Telephone Encounter (Signed)
Patient declined appointment today. Patient stated that he would go to the [place] to be evaluated due to being far away from our office. Patient doesn't think it is allergic reaction and that it is a "systemic response to everything that I have going on". "The skin is blotchy but also dry". I advised patient I would forward his message to Dr.Manandhar.    [place], CMA

## 2021-09-02 NOTE — Telephone Encounter (Signed)
Patient called stating that he is concerned he may be having a reaction IV antibiotics. Patient has skin "blotches" all over his body x 1 week. Patient states that it isn't a rash. Patient got his lab work back and saw that eosinophils are elevated as well. I advised patient I would send a message back to pharmacy team and Dr.Manandhar.   [place], CMA

## 2021-09-12 ENCOUNTER — Ambulatory Visit: Payer: No Typology Code available for payment source | Admitting: Infectious Diseases

## 2021-10-20 ENCOUNTER — Encounter: Payer: Self-pay | Admitting: *Deleted

## 2021-10-22 ENCOUNTER — Ambulatory Visit: Payer: No Typology Code available for payment source

## 2021-10-23 ENCOUNTER — Other Ambulatory Visit (HOSPITAL_BASED_OUTPATIENT_CLINIC_OR_DEPARTMENT_OTHER): Payer: Self-pay

## 2021-10-23 MED ORDER — INFLUENZA VAC SPLIT QUAD 0.5 ML IM SUSY
PREFILLED_SYRINGE | INTRAMUSCULAR | 0 refills | Status: DC
  Filled 2021-10-23: qty 0.5, 1d supply, fill #0

## 2021-11-27 IMAGING — CT CT ANGIO CHEST
3 of 8 series · 18 of 46 positions shown · IV contrast (OMNIPAQUE 350)
Comparison: None.

CLINICAL DATA: Thoracic aortic aneurysm follow-up

EXAM:
CT ANGIOGRAPHY CHEST WITH CONTRAST
TECHNIQUE: Multidetector CT imaging of the chest was performed using the
standard protocol during bolus administration of intravenous
contrast. Multiplanar CT image reconstructions and MIPs were
obtained to evaluate the vascular anatomy.
CONTRAST:  100mL OMNIPAQUE IOHEXOL 350 MG/ML SOLN

[Series 4: aorta 3.0 bf37 2 · axial · 0.80mm/px · z∈[-330,-62]mm · 13 of 105 slices shown]
[im 8/105  lung]
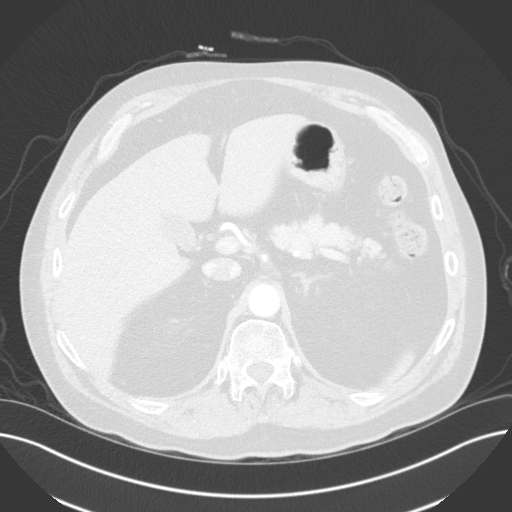
[im 15/105  soft-tissue]
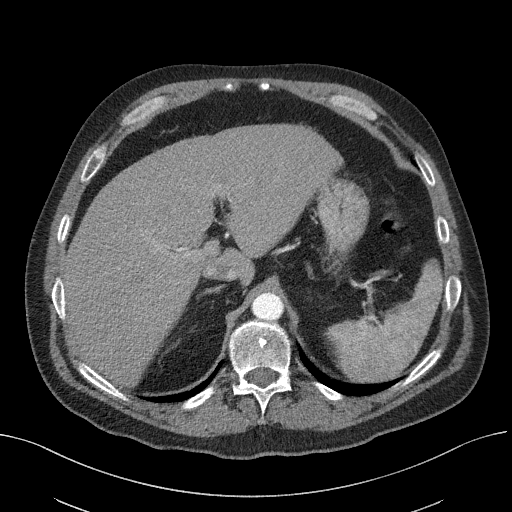
[im 23/105  lung]
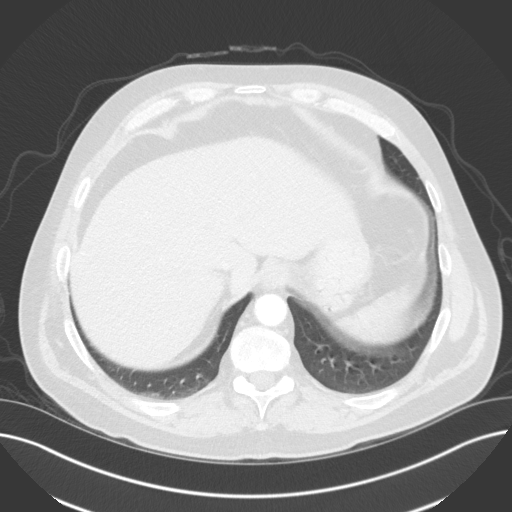
[im 30/105  soft-tissue]
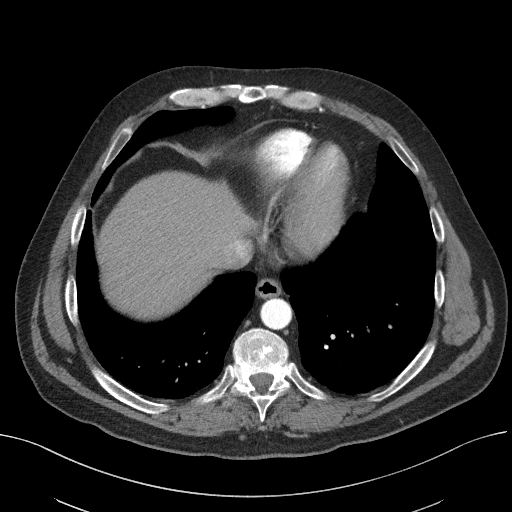
[im 38/105  lung]
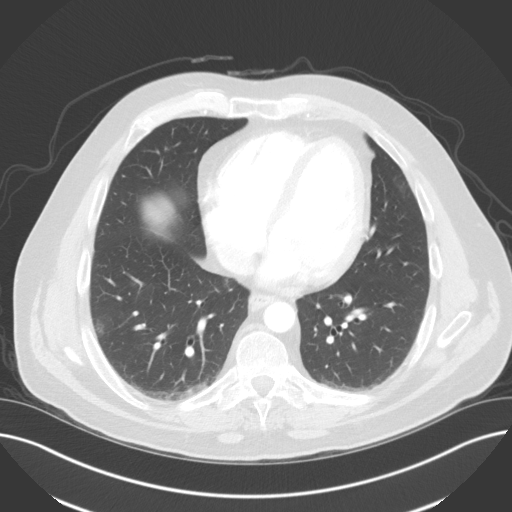
[im 45/105  soft-tissue]
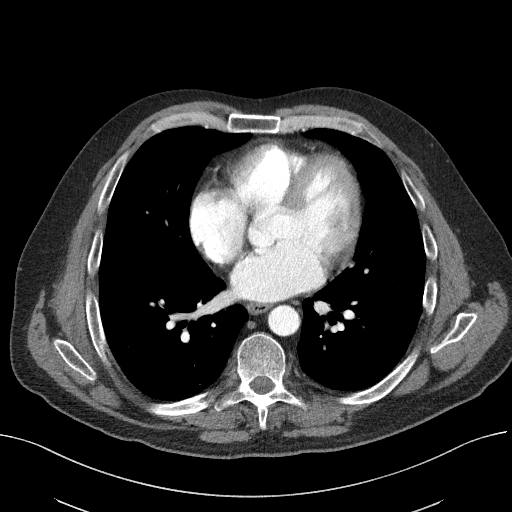
[im 53/105  lung]
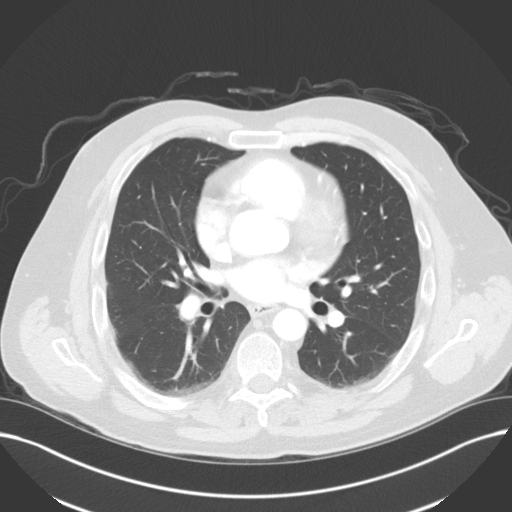
[im 60/105  soft-tissue]
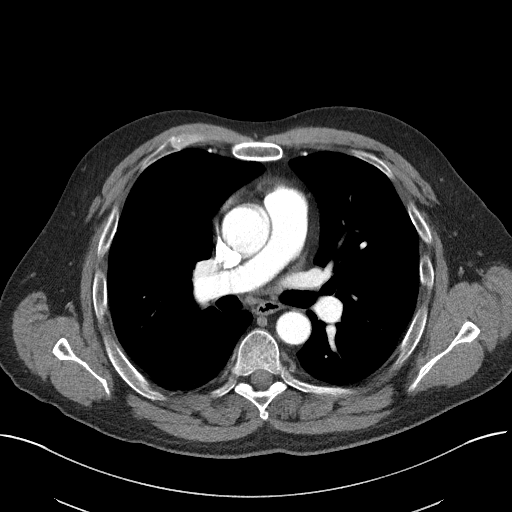
[im 67/105  lung]
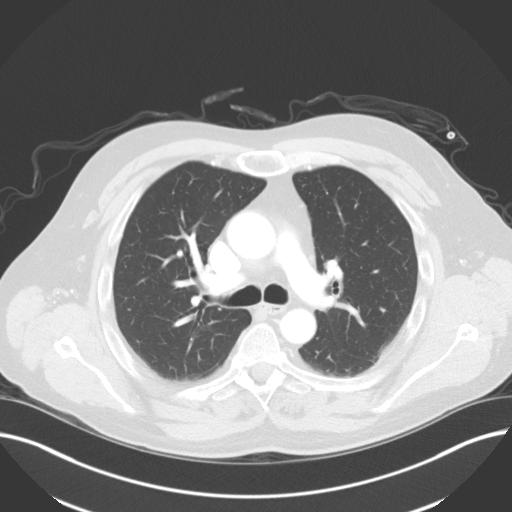
[im 75/105  soft-tissue]
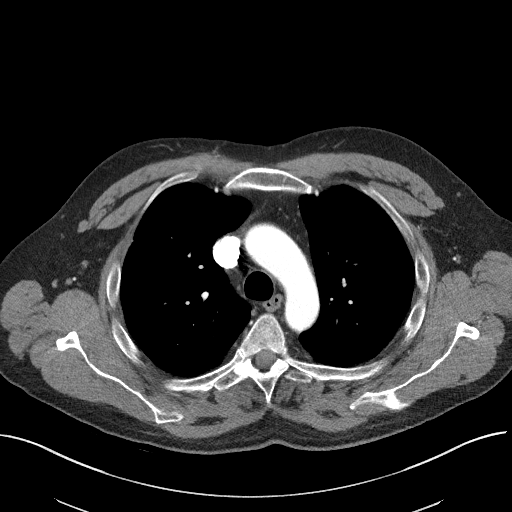
[im 82/105  lung]
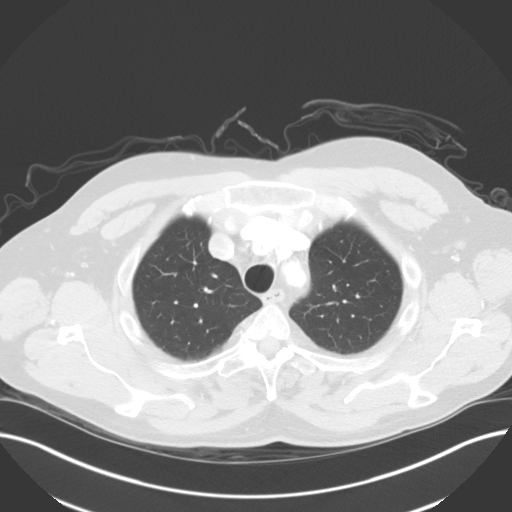
[im 90/105  soft-tissue]
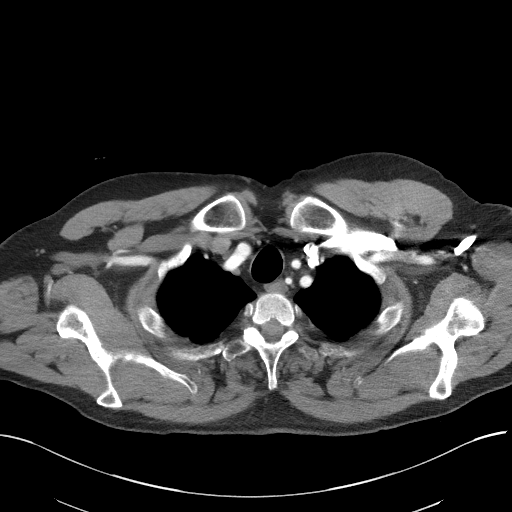
[im 97/105  lung]
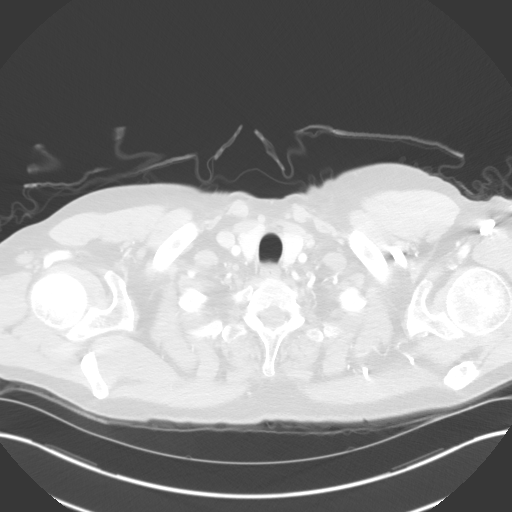

[Series 5: lung · axial · 0.80mm/px · z∈[-330,-284]mm · 2 of 105 slices shown]
[im 8/105  soft-tissue]
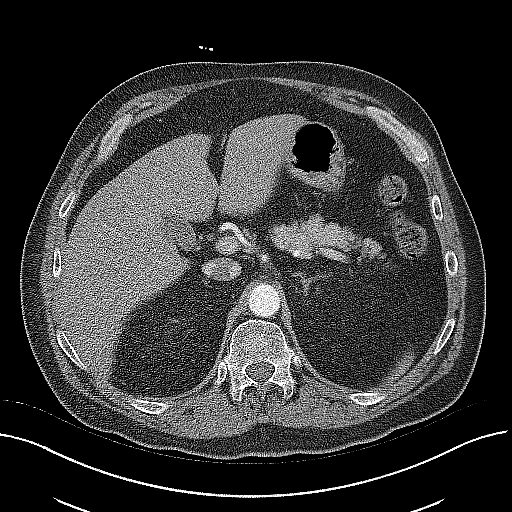
[im 23/105  soft-tissue]
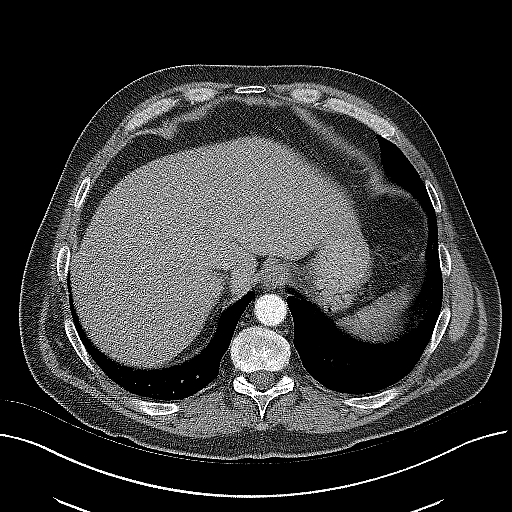

[Series 7: coronals · coronal · 0.63mm/px · 3 of 132 slices shown]
[im 33/132  soft-tissue]
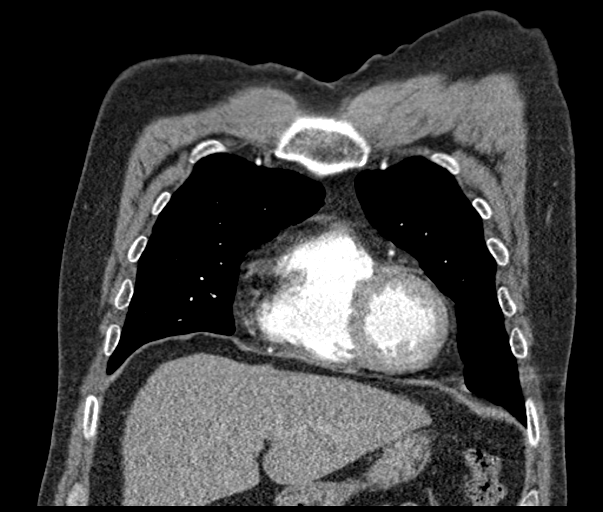
[im 66/132  soft-tissue]
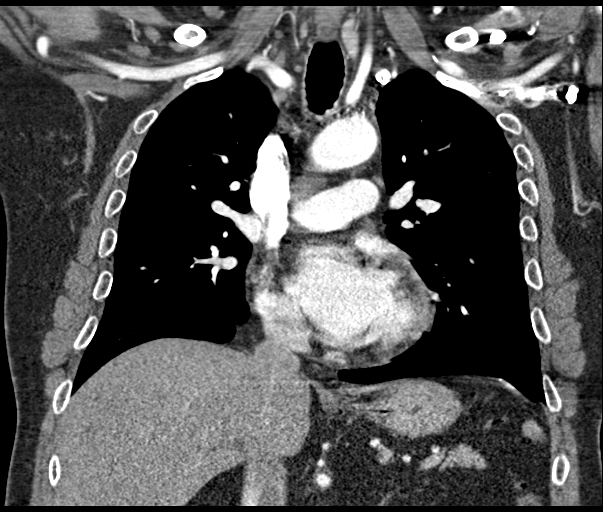
[im 99/132  soft-tissue]
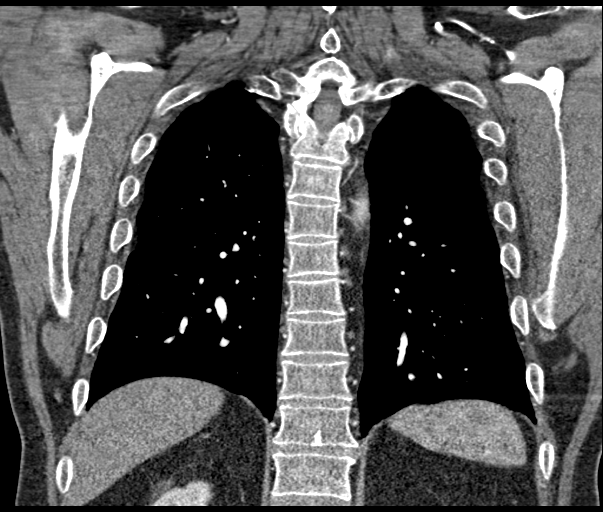

[18 of 46 positions shown; findings below may reference images not displayed]

FINDINGS: Cardiovascular: Mild aneurysmal dilatation of the aortic root
measuring maximally 4.2 cm at the sinuses of Valsalva. Ascending
thoracic aortic maximum diameter 3.9 cm. Heart is normal size.

Mediastinum/Nodes: No mediastinal, hilar, or axillary adenopathy.
Trachea and esophagus are unremarkable. Thyroid unremarkable.

Lungs/Pleura: Minimal dependent atelectasis in the lungs. No
confluent opacities or effusions.

Upper Abdomen: Imaging into the upper abdomen shows no acute
findings.

Musculoskeletal: Chest wall soft tissues are unremarkable. No acute
bony abnormality.

Review of the MIP images confirms the above findings.
IMPRESSION: 4.2 cm aortic aneurysm at the aortic root/sinuses of Valsalva.
Recommend annual imaging followup by CTA or MRA. This recommendation
follows 3434 ACCF/AHA/AATS/ACR/ASA/SCA/MCGEE/KVNG/HAKEEM/ZQ Guidelines
for the Diagnosis and Management of Patients with Thoracic Aortic
Disease. Circulation. 3434; 121: [id number]. Aortic aneurysm NOS
([id number].L)

No acute cardiopulmonary disease.

## 2022-01-21 ENCOUNTER — Other Ambulatory Visit (HOSPITAL_BASED_OUTPATIENT_CLINIC_OR_DEPARTMENT_OTHER): Payer: Self-pay | Admitting: Family

## 2022-01-21 NOTE — Telephone Encounter (Signed)
Rx(s) sent to pharmacy electronically.  

## 2022-02-18 ENCOUNTER — Other Ambulatory Visit: Payer: Self-pay | Admitting: Family

## 2022-02-18 DIAGNOSIS — J449 Chronic obstructive pulmonary disease, unspecified: Secondary | ICD-10-CM

## 2022-02-18 DIAGNOSIS — J301 Allergic rhinitis due to pollen: Secondary | ICD-10-CM

## 2022-07-07 ENCOUNTER — Other Ambulatory Visit (HOSPITAL_BASED_OUTPATIENT_CLINIC_OR_DEPARTMENT_OTHER): Payer: Self-pay

## 2022-07-07 DIAGNOSIS — I10 Essential (primary) hypertension: Secondary | ICD-10-CM

## 2022-07-07 MED ORDER — CARVEDILOL 25 MG PO TABS: 25.0000 mg | ORAL_TABLET | Freq: Two times a day (BID) | ORAL | 0 refills | Status: DC

## 2022-07-07 NOTE — Telephone Encounter (Signed)
Patient overdue for follow up with Dr. Jacques Navy, routed over to [facility] scheduling team

## 2022-07-07 NOTE — Telephone Encounter (Signed)
From: Wess Botts To: Office of Alver Sorrow, NP Sent: 07/07/2022 8:58 AM EDT Subject: Medication Renewal Request  Refills have been requested for the following medications:   carvedilol (COREG) 25 MG tablet [Caitlin S Walker]  Preferred pharmacy: [id number] - [place], [place] - [address] Delivery method: [employer]

## 2022-07-13 ENCOUNTER — Ambulatory Visit (HOSPITAL_BASED_OUTPATIENT_CLINIC_OR_DEPARTMENT_OTHER): Payer: No Typology Code available for payment source | Admitting: Family

## 2022-07-29 ENCOUNTER — Encounter (HOSPITAL_BASED_OUTPATIENT_CLINIC_OR_DEPARTMENT_OTHER): Payer: Self-pay | Admitting: Family

## 2022-07-29 ENCOUNTER — Ambulatory Visit (HOSPITAL_BASED_OUTPATIENT_CLINIC_OR_DEPARTMENT_OTHER): Payer: Managed Care, Other (non HMO) | Admitting: Family

## 2022-07-29 VITALS — BP 106/70 | HR 68 | Ht 75.0 in | Wt 232.3 lb

## 2022-07-29 DIAGNOSIS — Z6829 Body mass index (BMI) 29.0-29.9, adult: Secondary | ICD-10-CM

## 2022-07-29 DIAGNOSIS — I8393 Asymptomatic varicose veins of bilateral lower extremities: Secondary | ICD-10-CM

## 2022-07-29 DIAGNOSIS — I10 Essential (primary) hypertension: Secondary | ICD-10-CM

## 2022-07-29 DIAGNOSIS — R0602 Shortness of breath: Secondary | ICD-10-CM | POA: Diagnosis not present

## 2022-07-29 DIAGNOSIS — I77810 Thoracic aortic ectasia: Secondary | ICD-10-CM | POA: Diagnosis not present

## 2022-07-29 MED ORDER — CARVEDILOL 25 MG PO TABS: 12.5000 mg | ORAL_TABLET | Freq: Two times a day (BID) | ORAL | 0 refills | Status: AC

## 2022-07-29 NOTE — Progress Notes (Signed)
Cardiology Office Note:  .   Date:  07/29/2022  ID:  Reginald Walsh, DOB Dec 02, 1965, MRN [id number] PCP: [name], NP  [facility] Providers Cardiologist:  Parke Poisson, MD    History of Present Illness: .   Reginald Walsh is a 57 y.o. male with a hx of normal coronary arteries by cardiac catheterization 03/2019, hypertension, GERD last seen 06/26/21.  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  Presently works providing Theme park manager.  He additionally follows with the [place].   He has followed with Dr. Jacques Navy since February 2021 for episode of chest pain.  He had abnormal EKG with inferior Q waves.  Exercise Myoview 03/14/2019 low risk with LVEF 45-54% but has basilar infiltrate and mild inferior defect of medium severity.  Underwent diagnostic cath 03/01/2019 with normal coronaries.  Echocardiogram 04/18/2019 normal LVEF, mild LVH, dilated aortic root at 40 mm.  CTA 05/2020 showed 22 mm aortic root dilation.   He was seen 06/2020 doing overall well from cardiac perspective.  He was recommended for repeat CTA in one year.  November 2022 he noted elevated blood pressure via MyChart message he was transition from metoprolol to carvedilol.   Admitted 3/27 - 04/23/2021 after presenting with hyponatremia.  He had right hip arthroplasty the week prior.  Hydrochlorothiazide discontinued.  ED visit 04/26/2021 for cellulitis in the right hip treated with IV vancomycin and discharged on doxycycline course.   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 Jacques Navy.  Moderate compression deformity involving the superior endplate of T8 body without fracture line favored to be subacute/chronic in etiology.  Suspected hepatic steatosis with nodularity of hepatic contour recommended for correlation with LFT.   05/09/2021 AST 15, ALT 15, alkaline phosphatase 93, K 3.9, Na 129.   Last seen 06/26/2021 doing well from a cardiac perspective.  Due to carpal tunnel he was referred to the hand center.  PFTs ordered due to shortness of breath performed 08/2021 with spirometry flows lower limits of normal with mild obstruction and small airways which improved after bronchodilator.  He was recommended to continue Combivent.  Presents today for follow up independently.  He has succeeded in losing 30 lbs through lifestyle changes including intermittent fasting. He also stopped Lyrica about the same time and it may have contributed to weight loss. Pain has still been manageable still with tingly feeling. Tells me he doesn't feel well today. Has had a stress ful past month. Feels fatigued. Notes not sleeping well and feeling sluggish. Also notes more dyspnea which is improved with PRN albuterol and BID Respimat. BP recent outside clinic visits 110s-116/70-80s. Interested in simplifying his medication regimen.   Reports no chest pain, pressure, or tightness. No edema, orthopnea, PND. Reports no palpitations.    ROS: Please see the history of present illness.    All other systems reviewed and are negative.   Studies Reviewed: Marland Kitchen   EKG Interpretation Date/Time:  Wednesday July 29 2022 08:14:08 EDT Ventricular Rate:  68 PR Interval:  158 QRS Duration:  110 QT Interval:  412 QTC Calculation: 438 R Axis:   46  Text Interpretation: Normal sinus rhythm Normal ECG Confirmed by Gillian Shields ([id number]) on 07/29/2022 8:41:02 AM    Cardiac Studies & Procedures   CARDIAC CATHETERIZATION  CARDIAC CATHETERIZATION 03/21/2019  Narrative  There is mild left ventricular systolic dysfunction.  LV end diastolic pressure is normal.  There is no mitral valve regurgitation.  The left ventricular ejection fraction is 45-50% by visual estimate.  1. No angiographic evidence of CAD 2. LVEDP 17 mmHg 3. Mild LV systolic dysfunction  with segmental wall motion abnormality  Recommendations: No further ischemic workup.  Findings Coronary Findings Diagnostic  Dominance: Right  Left Anterior Descending Vessel is large.  Left Circumflex Vessel is large.  Right Coronary Artery Vessel is large.  Intervention  No interventions have been documented.   STRESS TESTS  MYOCARDIAL PERFUSION IMAGING 03/14/2019  Narrative  The left ventricular ejection fraction is mildly decreased (45-54%).  Nuclear stress EF: 48%.  Blood pressure demonstrated a normal response to exercise.  There was no ST segment deviation noted during stress.  No T wave inversion was noted during stress.  Defect 1: There is a medium defect of mild severity present in the basal inferior, mid inferior and apical inferior location.  This is a low risk study.  No prior study for comparison.  Mild fixed inferior defect, did not significantly improve with upright positioning. With extracardiac activity noted in diaphragm, may be attenuation artifact, but cannot exclude mild fixed defect. Low normal EF with mildly hypokinetic base to mid septum.   ECHOCARDIOGRAM  ECHOCARDIOGRAM COMPLETE 07/12/2020  Narrative ECHOCARDIOGRAM REPORT    Patient Name:   Reginald Walsh Date of Exam: 07/12/2020 Medical Rec #:  [id number]      Height:       75.0 in Accession #:    [id number]     Weight:       254.2 lb Date of Birth:  [birth year]       BSA:          2.430 m Patient Age:    55 years       BP:           118/76 mmHg Patient Gender: M              HR:           65 bpm. Exam Location:  [address]  Procedure: 2D Echo, Cardiac Doppler, Color Doppler and Intracardiac Opacification Agent  Indications:    I77.810 Ascending Aortic  History:        Patient has prior history of Echocardiogram examinations, most recent 04/18/2019. Signs/Symptoms:lower extremity edema; Risk Factors:Hypertension. Previous echo revealed LVEF 50-55%, ascending aorta 40 mm and  strain -18.5.  Sonographer:    Chanetta Marshall [facility], RDCS Referring Phys: [id number] Parke Poisson  IMPRESSIONS   1. Left ventricular ejection fraction, by estimation, is 55 to 60%. The left ventricle has normal function. The left ventricle has no regional wall motion abnormalities. The left ventricular internal cavity size was mildly dilated. Left ventricular diastolic parameters are consistent with Grade I diastolic dysfunction (impaired relaxation). 2. Right ventricular systolic function is normal. The right ventricular size is normal. 3. The mitral valve is normal in structure. No evidence of mitral valve regurgitation. No evidence of mitral stenosis. 4. The aortic valve is tricuspid. Aortic valve regurgitation is not visualized. Mild aortic valve sclerosis is present, with no evidence of aortic valve stenosis. 5. Aortic dilatation noted. There is mild dilatation of the aortic root, measuring 40 mm. There is mild dilatation of the ascending aorta, measuring 42 mm.  FINDINGS Left Ventricle: Left ventricular ejection fraction, by estimation, is 55 to 60%. The left ventricle has normal function. The left ventricle has  no regional wall motion abnormalities. Definity contrast agent was given IV to delineate the left ventricular endocardial borders. The left ventricular internal cavity size was mildly dilated. There is no left ventricular hypertrophy. Left ventricular diastolic parameters are consistent with Grade I diastolic dysfunction (impaired relaxation).  Right Ventricle: The right ventricular size is normal.Right ventricular systolic function is normal.  Left Atrium: Left atrial size was normal in size.  Right Atrium: Right atrial size was normal in size.  Pericardium: There is no evidence of pericardial effusion.  Mitral Valve: The mitral valve is normal in structure. No evidence of mitral valve regurgitation. No evidence of mitral valve stenosis.  Tricuspid Valve: The tricuspid  valve is normal in structure. Tricuspid valve regurgitation is not demonstrated. No evidence of tricuspid stenosis.  Aortic Valve: The aortic valve is tricuspid. Aortic valve regurgitation is not visualized. Mild aortic valve sclerosis is present, with no evidence of aortic valve stenosis.  Pulmonic Valve: The pulmonic valve was normal in structure. Pulmonic valve regurgitation is not visualized. No evidence of pulmonic stenosis.  Aorta: Aortic dilatation noted. There is mild dilatation of the aortic root, measuring 40 mm. There is mild dilatation of the ascending aorta, measuring 42 mm.  Venous: The inferior vena cava was not well visualized.  LEFT VENTRICLE PLAX 2D LVIDd:         5.60 cm  Diastology LVIDs:         3.70 cm  LV e' medial:    4.46 cm/s LV PW:         0.90 cm  LV E/e' medial:  8.4 LV IVS:        1.00 cm  LV e' lateral:   6.09 cm/s LVOT diam:     2.55 cm  LV E/e' lateral: 6.1 LV SV:         79 LV SV Index:   33 LVOT Area:     5.11 cm   RIGHT VENTRICLE RV Basal diam:  3.30 cm RV S prime:     10.20 cm/s TAPSE (M-mode): 2.2 cm  LEFT ATRIUM             Index       RIGHT ATRIUM           Index LA diam:        4.30 cm 1.77 cm/m  RA Area:     13.00 cm LA Vol (A2C):   55.0 ml 22.64 ml/m RA Volume:   32.20 ml  13.25 ml/m LA Vol (A4C):   56.6 ml 23.29 ml/m LA Biplane Vol: 56.7 ml 23.34 ml/m AORTIC VALVE LVOT Vmax:   68.50 cm/s LVOT Vmean:  50.600 cm/s LVOT VTI:    0.155 m  AORTA Ao Root diam: 4.00 cm Ao Asc diam:  4.25 cm  MITRAL VALVE MV Area (PHT): 2.01 cm    SHUNTS MV Decel Time: 378 msec    Systemic VTI:  0.16 m MV E velocity: 37.30 cm/s  Systemic Diam: 2.55 cm MV A velocity: 46.20 cm/s MV E/A ratio:  0.81  Olga Millers MD Electronically signed by Olga Millers MD Signature Date/Time: 07/12/2020/12:43:37 PM    Final             Risk Assessment/Calculations:             Physical Exam:   VS:  BP 106/70   Pulse 68   Ht 6\' 3"  (1.905 m)    Wt 232 lb 4.8 oz (105.4 kg)   BMI 29.04  kg/m    Wt Readings from Last 3 Encounters:  07/29/22 232 lb 4.8 oz (105.4 kg)  08/20/21 246 lb (111.6 kg)  07/25/21 250 lb (113.4 kg)    GEN: Well nourished, well developed in no acute distress NECK: No JVD; No carotid bruits CARDIAC: RRR, no murmurs, rubs, gallops RESPIRATORY:  Clear to auscultation without rales, wheezing or rhonchi  ABDOMEN: Soft, non-tender, non-distended EXTREMITIES:  No edema; No deformity   ASSESSMENT AND PLAN: .    LE edema / Varicose veins -prior increased swelling with amlodipine which has since been discontinued.  Lasix previously discontinued due to leg cramps.  Hydrochlorothiazide previously discontinued due to hypokalemia, hyponatremia. No recurrent edema, no indication for intervention at this time.  Recommend elevation, compression stockings, low sodium diet.   HTN - Hypotensive today with no lightheadedness but does endorse feeling sluggish. Reduce Coreg to 12.5mg  BID. Check in via MyChart in 2 weeks. Anticipate BP lower due to intentional weight loss and may be able to continue reducing antihypertensive regimen.  Previous intolerances include Amlodipine (LE edema) and Losartan (ineffective).    Ascending aorta enlargement -05/2021 CT stable ascending aorta 40 mm.  Repeat CT in 2 years per recommendations of Dr. Jacques Navy as has been overall stable.  Continue optimal blood pressure control.  HLD - Continue rosuvastatin 20 mg daily.  LDL goal less than 100. Per his report lipid panel earlier this year with PCP was good.   Shortness of breath / COPD - Notes dyspnea requiring more utilization of his inhaler. PFT 08/2021 low limits of normal with mild obstruction in small airways improved after bronchodilator. Plan for echocardiogram to rule out heart failure or valvular abnormality as contributory to dyspnea. This will also allow for assessment of ascending aorta size. If unremarkable and persistent dyspnea, low threshold to  refer to pulmonology.  Overweight  - Congratulated on 30 pound weight loss through lifestyle changes including eating out less and reduced etoh intake.   Hepatic steatosis -CT 05/2021 with suspected hepatic steatosis with nodular hepatic contour.  04/2021 normal LFT.  Has reduced his fried food and intake of beer. Reports labs with PCP earlier this year were unremarkable.        Dispo: follow up in 1 year with Dr. Jacques Navy or APP  Signed, Alver Sorrow, NP

## 2022-07-29 NOTE — Patient Instructions (Signed)
Medication Instructions:  Your physician has recommended you make the following change in your medication:   REDUCE Carvedilol to 12.5mg  twice daily May take half of your 25mg  tablet   *If you need a refill on your cardiac medications before your next appointment, please call your pharmacy*  Testing/Procedures: Your physician has requested that you have an echocardiogram. Echocardiography is a painless test that uses sound waves to create images of your heart. It provides your doctor with information about the size and shape of your heart and how well your heart's chambers and valves are working. This procedure takes approximately one hour. There are no restrictions for this procedure. Please do NOT wear cologne, perfume, aftershave, or lotions (deodorant is allowed). Please arrive 15 minutes prior to your appointment time.    Follow-Up: At [facility], you and your health needs are our priority.  As part of our continuing mission to provide you with exceptional heart care, we have created designated Provider Care Teams.  These Care Teams include your primary Cardiologist (physician) and Advanced Practice Providers (APPs -  Physician Assistants and Nurse Practitioners) who all work together to provide you with the care you need, when you need it.  We recommend signing up for the patient portal called "MyChart".  Sign up information is provided on this After Visit Summary.  MyChart is used to connect with patients for Virtual Visits (Telemedicine).  Patients are able to view lab/test results, encounter notes, upcoming appointments, etc.  Non-urgent messages can be sent to your provider as well.   To learn more about what you can do with MyChart, go to [email or web address].    Your next appointment:   1 year(s)  Provider:   Parke Poisson, MD  or Alver Sorrow, NP    Other Instructions  We will send you a MyChart message in 2 weeks to check in on blood pressure.

## 2022-08-13 ENCOUNTER — Encounter (HOSPITAL_BASED_OUTPATIENT_CLINIC_OR_DEPARTMENT_OTHER): Payer: Self-pay

## 2022-09-01 ENCOUNTER — Other Ambulatory Visit (HOSPITAL_BASED_OUTPATIENT_CLINIC_OR_DEPARTMENT_OTHER): Payer: Managed Care, Other (non HMO)

## 2022-09-04 ENCOUNTER — Encounter (HOSPITAL_COMMUNITY): Payer: Self-pay

## 2022-09-04 NOTE — Progress Notes (Signed)
Surgery orders requested via Epic inbox. °

## 2022-09-04 NOTE — Patient Instructions (Addendum)
SURGICAL WAITING ROOM VISITATION Patients having surgery or a procedure may have no more than 2 support people in the waiting area - these visitors may rotate.    Children under the age of 19 must have an adult with them who is not the patient.  If the patient needs to stay at the hospital during part of their recovery, the visitor guidelines for inpatient rooms apply. Pre-op nurse will coordinate an appropriate time for 1 support person to accompany patient in pre-op.  This support person may not rotate.    Please refer to the [facility] website for the visitor guidelines for Inpatients (after your surgery is over and you are in a regular room).       Your procedure is scheduled on: 09-08-22   Report to [facility] Main Entrance    Report to admitting at 12:00 PM   Call this number if you have problems the morning of surgery [phone]   Do not eat food :After Midnight.   After Midnight you may have the following liquids until 11:20 AM DAY OF SURGERY  Water Non-Citrus Juices (without pulp, NO RED-Apple, White grape, White cranberry) Black Coffee (NO MILK/CREAM OR CREAMERS, sugar ok)  Clear Tea (NO MILK/CREAM OR CREAMERS, sugar ok) regular and decaf                             Plain Jell-O (NO RED)                                           Fruit ices (not with fruit pulp, NO RED)                                     Popsicles (NO RED)                                                               Sports drinks like Gatorade (NO RED)                   The day of surgery:  Drink ONE (1) Pre-Surgery Clear Ensure at 11:20 AM the morning of surgery. Drink in one sitting. Do not sip.  This drink was given to you during your hospital  pre-op appointment visit. Nothing else to drink after completing the Pre-Surgery Clear Ensure.          If you have questions, please contact your surgeon's office.   FOLLOW ANY ADDITIONAL PRE OP INSTRUCTIONS YOU RECEIVED FROM YOUR  SURGEON'S OFFICE!!!     Oral Hygiene is also important to reduce your risk of infection.                                    Remember - BRUSH YOUR TEETH THE MORNING OF SURGERY WITH YOUR REGULAR TOOTHPASTE   Do NOT smoke after Midnight   Take these medicines the morning of surgery with A SIP OF WATER:   Carvedilol  Duloxetine  Hydrocodone  Levofloxacin  Singulair  Linezolid  Pantoprazole  Rosuvastatin  Okay to use inhalers and nasal spray  Stop all vitamins and herbal supplements 7 days before surgery  Bring CPAP mask and tubing day of surgery.                              You may not have any metal on your body including jewelry, and body piercing             Do not wear lotions, powders, cologne, or deodorant              Men may shave face and neck.   Do not bring valuables to the hospital. [facility] IS NOT RESPONSIBLE   FOR VALUABLES.   Contacts, dentures or bridgework may not be worn into surgery.   Bring small overnight bag day of surgery.   DO NOT BRING YOUR HOME MEDICATIONS TO THE HOSPITAL. PHARMACY WILL DISPENSE MEDICATIONS LISTED ON YOUR MEDICATION LIST TO YOU DURING YOUR ADMISSION IN THE HOSPITAL!    Special Instructions: Bring a copy of your healthcare power of attorney and living will documents the day of surgery if you haven't scanned them before.              Please read over the following fact sheets you were given: IF YOU HAVE QUESTIONS ABOUT YOUR PRE-OP INSTRUCTIONS PLEASE CALL [phone] [facility]  If you received a COVID test during your pre-op visit  it is requested that you wear a mask when out in public, stay away from anyone that may not be feeling well and notify your surgeon if you develop symptoms. If you test positive for Covid or have been in contact with anyone that has tested positive in the last 10 days please notify you surgeon.  [facility] - Preparing for Surgery Before surgery, you can play an important role.  Because skin is not sterile,  your skin needs to be as free of germs as possible.  You can reduce the number of germs on your skin by washing with CHG (chlorahexidine gluconate) soap before surgery.  CHG is an antiseptic cleaner which kills germs and bonds with the skin to continue killing germs even after washing. Please DO NOT use if you have an allergy to CHG or antibacterial soaps.  If your skin becomes reddened/irritated stop using the CHG and inform your nurse when you arrive at Short Stay. Do not shave (including legs and underarms) for at least 48 hours prior to the first CHG shower.  You may shave your face/neck.  Please follow these instructions carefully:  1.  Shower with CHG Soap the night before surgery and the  morning of surgery.  2.  If you choose to wash your hair, wash your hair first as usual with your normal  shampoo.  3.  After you shampoo, rinse your hair and body thoroughly to remove the shampoo.                             4.  Use CHG as you would any other liquid soap.  You can apply chg directly to the skin and wash.  Gently with a scrungie or clean washcloth.  5.  Apply the CHG Soap to your body ONLY FROM THE NECK DOWN.   Do   not use on face/ open  Wound or open sores. Avoid contact with eyes, ears mouth and   genitals (private parts).                       Wash face,  Genitals (private parts) with your normal soap.             6.  Wash thoroughly, paying special attention to the area where your    surgery  will be performed.  7.  Thoroughly rinse your body with warm water from the neck down.  8.  DO NOT shower/wash with your normal soap after using and rinsing off the CHG Soap.                9.  Pat yourself dry with a clean towel.            10.  Wear clean pajamas.            11.  Place clean sheets on your bed the night of your first shower and do not  sleep with pets. Day of Surgery : Do not apply any lotions/deodorants the morning of surgery.  Please wear clean  clothes to the hospital/surgery center.  FAILURE TO FOLLOW THESE INSTRUCTIONS MAY RESULT IN THE CANCELLATION OF YOUR SURGERY  PATIENT SIGNATURE_________________________________  NURSE SIGNATURE__________________________________  ________________________________________________________________________

## 2022-09-04 NOTE — Progress Notes (Addendum)
COVID Vaccine Completed:  Date of COVID positive in last 90 days:  PCP - [name], NP Cardiologist - Weston Brass, MD  Chest x-ray -  EKG - 07-29-22 Epic Stress Test - 03-14-19 Epic ECHO - 07-12-20 Epic Cardiac Cath - 03-21-19 Epic Pacemaker/ICD device last checked: Spinal Cord Stimulator:  Bowel Prep -   Sleep Study -  CPAP -   Fasting Blood Sugar -  Checks Blood Sugar _____ times a day  Last dose of GLP1 agonist-  N/A GLP1 instructions:  N/A   Last dose of SGLT-2 inhibitors-  N/A SGLT-2 instructions: N/A   Blood Thinner Instructions:  Time Aspirin Instructions: Last Dose:  Activity level:  Can go up a flight of stairs and perform activities of daily living without stopping and without symptoms of chest pain or shortness of breath.  Able to exercise without symptoms  Unable to go up a flight of stairs without symptoms of     Anesthesia review:  COPD, ascending aorta enlargement, COPD, HTN  Patient denies shortness of breath, fever, cough and chest pain at PAT appointment  Patient verbalized understanding of instructions that were given to them at the PAT appointment. Patient was also instructed that they will need to review over the PAT instructions again at home before surgery.

## 2022-09-07 ENCOUNTER — Encounter (HOSPITAL_COMMUNITY): Payer: Self-pay

## 2022-09-07 ENCOUNTER — Other Ambulatory Visit: Payer: Self-pay

## 2022-09-07 ENCOUNTER — Telehealth (HOSPITAL_BASED_OUTPATIENT_CLINIC_OR_DEPARTMENT_OTHER): Payer: Self-pay | Admitting: Family

## 2022-09-07 ENCOUNTER — Encounter (HOSPITAL_COMMUNITY)
Admission: RE | Admit: 2022-09-07 | Discharge: 2022-09-07 | Disposition: A | Discharge status: Home / Self Care | Payer: No Typology Code available for payment source | Source: Ambulatory Visit | Attending: Orthopedic Surgery | Admitting: Orthopedic Surgery

## 2022-09-07 VITALS — BP 119/93 | HR 78 | Temp 98.2°F | Resp 16 | Ht 75.0 in | Wt 230.0 lb

## 2022-09-07 DIAGNOSIS — I10 Essential (primary) hypertension: Secondary | ICD-10-CM

## 2022-09-07 DIAGNOSIS — T8451XA Infection and inflammatory reaction due to internal right hip prosthesis, initial encounter: Secondary | ICD-10-CM | POA: Diagnosis not present

## 2022-09-07 DIAGNOSIS — Z01812 Encounter for preprocedural laboratory examination: Secondary | ICD-10-CM | POA: Diagnosis not present

## 2022-09-07 DIAGNOSIS — Z01818 Encounter for other preprocedural examination: Secondary | ICD-10-CM | POA: Diagnosis present

## 2022-09-07 DIAGNOSIS — D649 Anemia, unspecified: Secondary | ICD-10-CM

## 2022-09-07 HISTORY — DX: Anemia, unspecified: D64.9

## 2022-09-07 HISTORY — DX: Gastro-esophageal reflux disease without esophagitis: K21.9

## 2022-09-07 HISTORY — DX: Anxiety disorder, unspecified: F41.9

## 2022-09-07 HISTORY — DX: Unspecified osteoarthritis, unspecified site: M19.90

## 2022-09-07 HISTORY — DX: Post-traumatic stress disorder, unspecified: F43.10

## 2022-09-07 HISTORY — DX: Depression, unspecified: F32.A

## 2022-09-07 LAB — CBC
HCT: 38 % — ABNORMAL LOW (ref 39.0–52.0)
Hemoglobin: 12.7 g/dL — ABNORMAL LOW (ref 13.0–17.0)
MCH: 28.8 pg (ref 26.0–34.0)
MCHC: 33.4 g/dL (ref 30.0–36.0)
MCV: 86.2 fL (ref 80.0–100.0)
Platelets: 190 10*3/uL (ref 150–400)
RBC: 4.41 MIL/uL (ref 4.22–5.81)
RDW: 15.5 % (ref 11.5–15.5)
WBC: 4.7 10*3/uL (ref 4.0–10.5)
nRBC: 0 % (ref 0.0–0.2)

## 2022-09-07 LAB — BASIC METABOLIC PANEL
Anion gap: 11 (ref 5–15)
BUN: 8 mg/dL (ref 6–20)
CO2: 26 mmol/L (ref 22–32)
Calcium: 9.2 mg/dL (ref 8.9–10.3)
Chloride: 95 mmol/L — ABNORMAL LOW (ref 98–111)
Creatinine, Ser: 0.69 mg/dL (ref 0.61–1.24)
GFR, Estimated: >60 mL/min (ref >60)
Glucose, Bld: 99 mg/dL (ref 70–99)
Potassium: 4.5 mmol/L (ref 3.5–5.1)
Sodium: 132 mmol/L — ABNORMAL LOW (ref 135–145)

## 2022-09-07 NOTE — Telephone Encounter (Signed)
   Name: Reginald Walsh  DOB: 09/09/1965  MRN: [id number]   Primary Cardiologist: Parke Poisson, MD  Chart reviewed as part of pre-operative protocol coverage. Patient was contacted 09/07/2022 in reference to pre-operative risk assessment for pending surgery as outlined below.  Reginald Walsh was last seen on 07/29/22 by Alver Sorrow, NP .  Since that day, Reginald Walsh has done well. Exercise tolerance >4 METS. No new chest pain. Echo ordered at last visit for monitoring of mild dilation of ascending aorta - this has been stable since 2021 and does not need to be completed prior to surgery.  Therefore, based on ACC/AHA guidelines, the patient would be at acceptable risk for the planned procedure without further cardiovascular testing.   The patient was advised that if he develops new symptoms prior to surgery to contact our office to arrange for a follow-up visit, and he verbalized understanding.  I will route this recommendation to the requesting party via Epic fax function and remove from pre-op pool. Please call with questions.  Alver Sorrow, NP 09/07/2022, 12:07 PM

## 2022-09-07 NOTE — Progress Notes (Signed)
Anesthesia Chart Review   Case: [id number] Date/Time: 09/08/22 1240   Procedure: IRRIGATION AND DEBRIDEMENT HIP (Right: Hip)   Anesthesia type: Spinal   Pre-op diagnosis: Right hip superfical versus deep infection, status post right total hip arthroplasty   Location: [facility] 09 / [facility]   Surgeons: Durene Romans, MD       DISCUSSION:57 y.o. former smoker with h/o HTN, GERD, COPD, right hip infection scheduled for above procedure 09/08/2022 with Dr. Durene Romans.   Discussed with cardiology. Per cardio note 09/07/2022, "Chart reviewed as part of pre-operative protocol coverage. Patient was contacted 09/07/2022 in reference to pre-operative risk assessment for pending surgery as outlined below.  Reginald Walsh was last seen on 07/29/22 by Alver Sorrow, NP .  Since that day, Reginald Walsh has done well. Exercise tolerance >4 METS. No new chest pain. Echo ordered at last visit for monitoring of mild dilation of ascending aorta - this has been stable since 2021 and does not need to be completed prior to surgery.   Therefore, based on ACC/AHA guidelines, the patient would be at acceptable risk for the planned procedure without further cardiovascular testing."  VS: BP (!) 119/93   Pulse 78   Temp 36.8 C (Oral)   Resp 16   Ht 6\' 3"  (1.905 m)   Wt 104.3 kg   SpO2 97%   BMI 28.75 kg/m   PROVIDERS: [name], NP is PCP   Cardiologist:  Parke Poisson, MD    LABS: Labs reviewed: Acceptable for surgery. (all labs ordered are listed, but only abnormal results are displayed)  Labs Reviewed  BASIC METABOLIC PANEL - Abnormal; Notable for the following components:      Result Value   Sodium 132 (*)    Chloride 95 (*)    All other components within normal limits  CBC - Abnormal; Notable for the following components:   Hemoglobin 12.7 (*)    HCT 38.0 (*)    All other components within normal limits     IMAGES:   EKG:   CV: Echo 07/12/2020 1. Left ventricular ejection  fraction, by estimation, is 55 to 60%. The  left ventricle has normal function. The left ventricle has no regional  wall motion abnormalities. The left ventricular internal cavity size was  mildly dilated. Left ventricular  diastolic parameters are consistent with Grade I diastolic dysfunction  (impaired relaxation).   2. Right ventricular systolic function is normal. The right ventricular  size is normal.   3. The mitral valve is normal in structure. No evidence of mitral valve  regurgitation. No evidence of mitral stenosis.   4. The aortic valve is tricuspid. Aortic valve regurgitation is not  visualized. Mild aortic valve sclerosis is present, with no evidence of  aortic valve stenosis.   5. Aortic dilatation noted. There is mild dilatation of the aortic root,  measuring 40 mm. There is mild dilatation of the ascending aorta,  measuring 42 mm.   Cardiac Cath 03/21/19 There is mild left ventricular systolic dysfunction. LV end diastolic pressure is normal. There is no mitral valve regurgitation. The left ventricular ejection fraction is 45-50% by visual estimate.   1. No angiographic evidence of CAD 2. LVEDP 17 mmHg 3. Mild LV systolic dysfunction with segmental wall motion abnormality   Recommendations: No further ischemic workup.   Myocardial Perfusion 03/14/2019 The left ventricular ejection fraction is mildly decreased (45-54%). Nuclear stress EF: 48%. Blood pressure demonstrated a normal response to exercise. There  was no ST segment deviation noted during stress. No T wave inversion was noted during stress. Defect 1: There is a medium defect of mild severity present in the basal inferior, mid inferior and apical inferior location. This is a low risk study. No prior study for comparison.   Mild fixed inferior defect, did not significantly improve with upright positioning. With extracardiac activity noted in diaphragm, may be attenuation artifact, but cannot exclude mild  fixed defect. Low normal EF with mildly hypokinetic base to mid septum.   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   40 mm by echo   Chest pain of uncertain etiology    Normal coronaries after abnormal Myoview Feb 2021 Echo March 2021 showed normal LVF- mild LVH    Chest pain of unknown etiology 02/2019   normal coronaries after abnormal Myoview   COPD (chronic obstructive pulmonary disease) (HCC)    Depression    Dyspnea    Encounter for screening for COVID-19 07/01/2021   Generalized enlarged lymph nodes 07/01/2021   GERD (gastroesophageal reflux disease)    Hypertension    normal RA dopplers   Hyponatremia    Other allergy status, other than to drugs and biological substances 07/01/2021   Other and unspecified complications of medical care, not elsewhere classified 07/01/2021   Other ill-defined and unknown causes of morbidity and mortality 07/01/2021   Overweight    Pain in left knee 07/01/2021   Pain in right foot 07/01/2021   PTSD (post-traumatic stress disorder)     Past Surgical History:  Procedure Laterality Date   HEMATOMA EVACUATION Right 07/27/2021   Procedure: EVACUATION HEMATOMA;  Surgeon: Durene Romans, MD;  Location: [facility];  Service: Orthopedics;  Laterality: Right;   HIP ARTHROPLASTY Left 2021   HIP ARTHROPLASTY Right 2023   INCISION AND DRAINAGE Right 07/27/2021   Procedure: INCISIONAL AND NON-INCISIONAL WOUND DEBRIDEMENT AND PRIMARY WOUND CLOSURE;  Surgeon: Durene Romans, MD;  Location: [facility];  Service: Orthopedics;  Laterality: Right;   INCISION AND DRAINAGE HIP Right 07/08/2021   Procedure: EXCISIONAL AND NON EXCISIONAL DEBRIDEMENT HIP;  Surgeon: Durene Romans, MD;  Location: [facility];  Service: Orthopedics;  Laterality: Right;   KNEE ARTHROSCOPY Left 2008   LEFT HEART CATH AND CORONARY ANGIOGRAPHY N/A 03/21/2019   Procedure: LEFT HEART CATH AND CORONARY  ANGIOGRAPHY;  Surgeon: Kathleene Hazel, MD;  Location: [facility];  Service: Cardiovascular;  Laterality: N/A;   LUMBAR LAMINECTOMY  2000   TENDON RELEASE Right 2018   foot   WISDOM TOOTH EXTRACTION     age 78    MEDICATIONS:  albuterol (VENTOLIN HFA) 108 (90 Base) MCG/ACT inhaler   augmented betamethasone dipropionate (DIPROLENE-AF) 0.05 % cream   carvedilol (COREG) 25 MG tablet   DULoxetine (CYMBALTA) 60 MG capsule   fluticasone (FLONASE) 50 MCG/ACT nasal spray   HYDROcodone-acetaminophen (NORCO/VICODIN) 5-325 MG tablet   hydrocortisone 2.5 % cream   influenza vac split quadrivalent PF (FLUARIX) 0.5 ML injection   Ipratropium-Albuterol (COMBIVENT) 20-100 MCG/ACT AERS respimat   levofloxacin (LEVAQUIN) 750 MG tablet   linezolid (ZYVOX) 600 MG tablet   montelukast (SINGULAIR) 10 MG tablet   Multiple Vitamins-Minerals (MENS MULTIVITAMIN PLUS PO)   pantoprazole (PROTONIX) 40 MG tablet   REFRESH TEARS 0.5 % SOLN   rosuvastatin (CRESTOR) 20 MG tablet   Vitamin D, Ergocalciferol, (DRISDOL) 1.25  MG (50000 UNIT) CAPS capsule   No current facility-administered medications for this encounter.    Jodell Cipro Ward, PA-C [facility] [phone]

## 2022-09-07 NOTE — Anesthesia Preprocedure Evaluation (Signed)
Anesthesia Evaluation  Patient identified by MRN, date of birth, ID band Patient awake    Reviewed: Allergy & Precautions, NPO status , Patient's Chart, lab work & pertinent test results  Airway Mallampati: I  TM Distance: >3 FB Neck ROM: Full    Dental  (+) Teeth Intact, Dental Advisory Given   Pulmonary asthma , COPD, former smoker   breath sounds clear to auscultation       Cardiovascular hypertension,  Rhythm:Regular Rate:Normal     Neuro/Psych  PSYCHIATRIC DISORDERS Anxiety Depression     Neuromuscular disease    GI/Hepatic Neg liver ROS,GERD  ,,  Endo/Other  negative endocrine ROS    Renal/GU negative Renal ROS     Musculoskeletal  (+) Arthritis ,    Abdominal   Peds  Hematology  (+) Blood dyscrasia, anemia   Anesthesia Other Findings   Reproductive/Obstetrics                             Anesthesia Physical Anesthesia Plan  ASA: 3  Anesthesia Plan: General   Post-op Pain Management: Tylenol PO (pre-op)* and Toradol IV (intra-op)*   Induction: Intravenous  PONV Risk Score and Plan: 3 and Ondansetron, Dexamethasone and Midazolam  Airway Management Planned: LMA  Additional Equipment: None  Intra-op Plan:   Post-operative Plan: Extubation in OR  Informed Consent: I have reviewed the patients History and Physical, chart, labs and discussed the procedure including the risks, benefits and alternatives for the proposed anesthesia with the patient or authorized representative who has indicated his/her understanding and acceptance.     Dental advisory given  Plan Discussed with: CRNA  Anesthesia Plan Comments: (See PAT note 09/07/2022  Lab Results      Component                Value               Date                      WBC                      4.7                 09/07/2022                HGB                      12.7 (L)            09/07/2022                HCT                       38.0 (L)            09/07/2022                MCV                      86.2                09/07/2022                PLT                      190  09/07/2022           )       Anesthesia Quick Evaluation

## 2022-09-08 ENCOUNTER — Other Ambulatory Visit: Payer: Self-pay

## 2022-09-08 ENCOUNTER — Encounter (HOSPITAL_COMMUNITY)
Admission: AD | Disposition: A | Discharge status: Home / Self Care | Payer: Self-pay | Source: Home / Self Care | Attending: Orthopedic Surgery

## 2022-09-08 ENCOUNTER — Ambulatory Visit (HOSPITAL_COMMUNITY): Payer: No Typology Code available for payment source | Admitting: Physician Assistant

## 2022-09-08 ENCOUNTER — Ambulatory Visit (HOSPITAL_BASED_OUTPATIENT_CLINIC_OR_DEPARTMENT_OTHER): Payer: No Typology Code available for payment source | Admitting: Anesthesiology

## 2022-09-08 ENCOUNTER — Encounter (HOSPITAL_COMMUNITY): Payer: Self-pay | Admitting: Orthopedic Surgery

## 2022-09-08 ENCOUNTER — Inpatient Hospital Stay (HOSPITAL_COMMUNITY)
Admission: AD | Admit: 2022-09-08 | Discharge: 2022-09-14 | DRG: 501 | Disposition: A | Discharge status: Home / Self Care | Payer: No Typology Code available for payment source | Attending: Orthopedic Surgery | Admitting: Orthopedic Surgery

## 2022-09-08 DIAGNOSIS — T8451XA Infection and inflammatory reaction due to internal right hip prosthesis, initial encounter: Principal | ICD-10-CM

## 2022-09-08 DIAGNOSIS — D508 Other iron deficiency anemias: Secondary | ICD-10-CM

## 2022-09-08 DIAGNOSIS — B3789 Other sites of candidiasis: Secondary | ICD-10-CM | POA: Diagnosis present

## 2022-09-08 DIAGNOSIS — F17290 Nicotine dependence, other tobacco product, uncomplicated: Secondary | ICD-10-CM | POA: Diagnosis present

## 2022-09-08 DIAGNOSIS — I10 Essential (primary) hypertension: Secondary | ICD-10-CM

## 2022-09-08 DIAGNOSIS — J4489 Other specified chronic obstructive pulmonary disease: Secondary | ICD-10-CM | POA: Diagnosis present

## 2022-09-08 DIAGNOSIS — Z8042 Family history of malignant neoplasm of prostate: Secondary | ICD-10-CM

## 2022-09-08 DIAGNOSIS — Z9109 Other allergy status, other than to drugs and biological substances: Secondary | ICD-10-CM

## 2022-09-08 DIAGNOSIS — B49 Unspecified mycosis: Secondary | ICD-10-CM

## 2022-09-08 DIAGNOSIS — Z6828 Body mass index (BMI) 28.0-28.9, adult: Secondary | ICD-10-CM

## 2022-09-08 DIAGNOSIS — D649 Anemia, unspecified: Secondary | ICD-10-CM

## 2022-09-08 DIAGNOSIS — M96842 Postprocedural seroma of a musculoskeletal structure following a musculoskeletal system procedure: Secondary | ICD-10-CM | POA: Diagnosis present

## 2022-09-08 DIAGNOSIS — E785 Hyperlipidemia, unspecified: Secondary | ICD-10-CM | POA: Diagnosis present

## 2022-09-08 DIAGNOSIS — Y831 Surgical operation with implant of artificial internal device as the cause of abnormal reaction of the patient, or of later complication, without mention of misadventure at the time of the procedure: Secondary | ICD-10-CM | POA: Diagnosis present

## 2022-09-08 DIAGNOSIS — Z792 Long term (current) use of antibiotics: Secondary | ICD-10-CM

## 2022-09-08 DIAGNOSIS — K219 Gastro-esophageal reflux disease without esophagitis: Secondary | ICD-10-CM | POA: Diagnosis present

## 2022-09-08 DIAGNOSIS — Z96642 Presence of left artificial hip joint: Secondary | ICD-10-CM | POA: Diagnosis present

## 2022-09-08 DIAGNOSIS — J449 Chronic obstructive pulmonary disease, unspecified: Secondary | ICD-10-CM | POA: Diagnosis not present

## 2022-09-08 DIAGNOSIS — Z86010 Personal history of colonic polyps: Secondary | ICD-10-CM

## 2022-09-08 DIAGNOSIS — Z888 Allergy status to other drugs, medicaments and biological substances status: Secondary | ICD-10-CM

## 2022-09-08 DIAGNOSIS — E871 Hypo-osmolality and hyponatremia: Secondary | ICD-10-CM | POA: Diagnosis present

## 2022-09-08 DIAGNOSIS — Z801 Family history of malignant neoplasm of trachea, bronchus and lung: Secondary | ICD-10-CM

## 2022-09-08 DIAGNOSIS — Z87891 Personal history of nicotine dependence: Secondary | ICD-10-CM | POA: Diagnosis not present

## 2022-09-08 DIAGNOSIS — Z79899 Other long term (current) drug therapy: Secondary | ICD-10-CM

## 2022-09-08 DIAGNOSIS — Z8616 Personal history of COVID-19: Secondary | ICD-10-CM

## 2022-09-08 DIAGNOSIS — F4312 Post-traumatic stress disorder, chronic: Secondary | ICD-10-CM | POA: Diagnosis present

## 2022-09-08 DIAGNOSIS — F17220 Nicotine dependence, chewing tobacco, uncomplicated: Secondary | ICD-10-CM | POA: Diagnosis present

## 2022-09-08 DIAGNOSIS — Z87892 Personal history of anaphylaxis: Secondary | ICD-10-CM

## 2022-09-08 DIAGNOSIS — D62 Acute posthemorrhagic anemia: Secondary | ICD-10-CM | POA: Diagnosis not present

## 2022-09-08 HISTORY — PX: INCISION AND DRAINAGE HIP: SHX1801

## 2022-09-08 LAB — TYPE AND SCREEN
ABO/RH(D): A POS
Antibody Screen: NEGATIVE

## 2022-09-08 LAB — AEROBIC/ANAEROBIC CULTURE W GRAM STAIN (SURGICAL/DEEP WOUND)

## 2022-09-08 SURGERY — IRRIGATION AND DEBRIDEMENT HIP
Anesthesia: General | Site: Hip | Laterality: Right

## 2022-09-08 MED ORDER — DIPHENHYDRAMINE HCL 50 MG/ML IJ SOLN
INTRAMUSCULAR | Status: AC
  Filled 2022-09-08: qty 1

## 2022-09-08 MED ORDER — ORAL CARE MOUTH RINSE: 15.0000 mL | Freq: Once | OROMUCOSAL | Status: AC

## 2022-09-08 MED ORDER — LACTATED RINGERS IV SOLN: INTRAVENOUS | Status: DC

## 2022-09-08 MED ORDER — CEFAZOLIN SODIUM-DEXTROSE 2-4 GM/100ML-% IV SOLN
2.0000 g | Freq: Four times a day (QID) | INTRAVENOUS | Status: AC
  Administered 2022-09-08 – 2022-09-09 (×2): 2 g via INTRAVENOUS
  Filled 2022-09-08 (×2): qty 100

## 2022-09-08 MED ORDER — AMISULPRIDE (ANTIEMETIC) 5 MG/2ML IV SOLN: 10.0000 mg | Freq: Once | INTRAVENOUS | Status: DC | PRN

## 2022-09-08 MED ORDER — CARVEDILOL 12.5 MG PO TABS
12.5000 mg | ORAL_TABLET | Freq: Two times a day (BID) | ORAL | Status: DC
  Administered 2022-09-08 – 2022-09-14 (×12): 12.5 mg via ORAL
  Filled 2022-09-08 (×12): qty 1

## 2022-09-08 MED ORDER — FLUTICASONE PROPIONATE 50 MCG/ACT NA SUSP
2.0000 | Freq: Every day | NASAL | Status: DC
  Administered 2022-09-09 – 2022-09-14 (×6): 2 via NASAL
  Filled 2022-09-08: qty 16

## 2022-09-08 MED ORDER — ASPIRIN 81 MG PO CHEW
81.0000 mg | CHEWABLE_TABLET | Freq: Two times a day (BID) | ORAL | Status: DC
  Administered 2022-09-08 – 2022-09-14 (×12): 81 mg via ORAL
  Filled 2022-09-08 (×12): qty 1

## 2022-09-08 MED ORDER — PHENOL 1.4 % MT LIQD: 1.0000 | OROMUCOSAL | Status: DC | PRN

## 2022-09-08 MED ORDER — DEXMEDETOMIDINE HCL IN NACL 80 MCG/20ML IV SOLN
INTRAVENOUS | Status: DC | PRN
  Administered 2022-09-08: 8 ug via INTRAVENOUS

## 2022-09-08 MED ORDER — HYDROMORPHONE HCL 1 MG/ML IJ SOLN
0.2500 mg | INTRAMUSCULAR | Status: DC | PRN
  Administered 2022-09-08: 0.5 mg via INTRAVENOUS

## 2022-09-08 MED ORDER — MEPERIDINE HCL 50 MG/ML IJ SOLN: 6.2500 mg | INTRAMUSCULAR | Status: DC | PRN

## 2022-09-08 MED ORDER — HYDROCODONE-ACETAMINOPHEN 5-325 MG PO TABS
1.0000 | ORAL_TABLET | ORAL | Status: DC | PRN
  Administered 2022-09-08 – 2022-09-11 (×2): 2 via ORAL
  Filled 2022-09-08 (×3): qty 2

## 2022-09-08 MED ORDER — HYDROMORPHONE HCL 1 MG/ML IJ SOLN
INTRAMUSCULAR | Status: DC | PRN
  Administered 2022-09-08 (×2): .5 mg via INTRAVENOUS

## 2022-09-08 MED ORDER — PHENYLEPHRINE 80 MCG/ML (10ML) SYRINGE FOR IV PUSH (FOR BLOOD PRESSURE SUPPORT)
PREFILLED_SYRINGE | INTRAVENOUS | Status: DC | PRN
  Administered 2022-09-08: 160 ug via INTRAVENOUS

## 2022-09-08 MED ORDER — PANTOPRAZOLE SODIUM 40 MG PO TBEC
40.0000 mg | DELAYED_RELEASE_TABLET | Freq: Every day | ORAL | Status: DC
  Administered 2022-09-09 – 2022-09-14 (×6): 40 mg via ORAL
  Filled 2022-09-08 (×6): qty 1

## 2022-09-08 MED ORDER — ONDANSETRON HCL 4 MG/2ML IJ SOLN
INTRAMUSCULAR | Status: AC
  Filled 2022-09-08: qty 2

## 2022-09-08 MED ORDER — IPRATROPIUM-ALBUTEROL 0.5-2.5 (3) MG/3ML IN SOLN
3.0000 mL | Freq: Two times a day (BID) | RESPIRATORY_TRACT | Status: DC
  Administered 2022-09-08 – 2022-09-11 (×6): 3 mL via RESPIRATORY_TRACT
  Filled 2022-09-08 (×7): qty 3

## 2022-09-08 MED ORDER — FENTANYL CITRATE PF 50 MCG/ML IJ SOSY
50.0000 ug | PREFILLED_SYRINGE | INTRAMUSCULAR | Status: AC
  Administered 2022-09-08: 25 ug via INTRAVENOUS

## 2022-09-08 MED ORDER — MONTELUKAST SODIUM 10 MG PO TABS
10.0000 mg | ORAL_TABLET | Freq: Every day | ORAL | Status: DC
  Administered 2022-09-09 – 2022-09-14 (×6): 10 mg via ORAL
  Filled 2022-09-08 (×6): qty 1

## 2022-09-08 MED ORDER — HYDROCODONE-ACETAMINOPHEN 7.5-325 MG PO TABS
1.0000 | ORAL_TABLET | ORAL | Status: DC | PRN
  Administered 2022-09-08 – 2022-09-14 (×22): 2 via ORAL
  Filled 2022-09-08 (×22): qty 2

## 2022-09-08 MED ORDER — MENTHOL 3 MG MT LOZG: 1.0000 | LOZENGE | OROMUCOSAL | Status: DC | PRN

## 2022-09-08 MED ORDER — MIDAZOLAM HCL 5 MG/5ML IJ SOLN
INTRAMUSCULAR | Status: DC | PRN
  Administered 2022-09-08: 2 mg via INTRAVENOUS

## 2022-09-08 MED ORDER — TRANEXAMIC ACID-NACL 1000-0.7 MG/100ML-% IV SOLN
1000.0000 mg | INTRAVENOUS | Status: AC
  Administered 2022-09-08: 1000 mg via INTRAVENOUS
  Filled 2022-09-08: qty 100

## 2022-09-08 MED ORDER — TRANEXAMIC ACID-NACL 1000-0.7 MG/100ML-% IV SOLN
1000.0000 mg | Freq: Once | INTRAVENOUS | Status: AC
  Administered 2022-09-08: 1000 mg via INTRAVENOUS
  Filled 2022-09-08: qty 100

## 2022-09-08 MED ORDER — DEXAMETHASONE SODIUM PHOSPHATE 10 MG/ML IJ SOLN
INTRAMUSCULAR | Status: AC
  Filled 2022-09-08: qty 1

## 2022-09-08 MED ORDER — PROPOFOL 10 MG/ML IV BOLUS
INTRAVENOUS | Status: DC | PRN
Start: 2022-09-08 — End: 2022-09-08
  Administered 2022-09-08: 200 mg via INTRAVENOUS
  Administered 2022-09-08 (×2): 30 mg via INTRAVENOUS

## 2022-09-08 MED ORDER — ACETAMINOPHEN 160 MG/5ML PO SOLN: 325.0000 mg | Freq: Once | ORAL | Status: DC | PRN

## 2022-09-08 MED ORDER — ONDANSETRON HCL 4 MG/2ML IJ SOLN
INTRAMUSCULAR | Status: DC | PRN
  Administered 2022-09-08: 4 mg via INTRAVENOUS

## 2022-09-08 MED ORDER — POVIDONE-IODINE 10 % EX SWAB: 2.0000 | Freq: Once | CUTANEOUS | Status: DC

## 2022-09-08 MED ORDER — CEFAZOLIN SODIUM-DEXTROSE 2-4 GM/100ML-% IV SOLN
2.0000 g | INTRAVENOUS | Status: AC
  Administered 2022-09-08: 2 g via INTRAVENOUS
  Filled 2022-09-08: qty 100

## 2022-09-08 MED ORDER — PROMETHAZINE HCL 25 MG/ML IJ SOLN: 6.2500 mg | INTRAMUSCULAR | Status: DC | PRN

## 2022-09-08 MED ORDER — PROPOFOL 10 MG/ML IV BOLUS
INTRAVENOUS | Status: AC
  Filled 2022-09-08: qty 20

## 2022-09-08 MED ORDER — HYDROMORPHONE HCL 1 MG/ML IJ SOLN
INTRAMUSCULAR | Status: AC
  Filled 2022-09-08: qty 2

## 2022-09-08 MED ORDER — FENTANYL CITRATE (PF) 100 MCG/2ML IJ SOLN
INTRAMUSCULAR | Status: DC | PRN
Start: 2022-09-08 — End: 2022-09-08
  Administered 2022-09-08 (×4): 25 ug via INTRAVENOUS

## 2022-09-08 MED ORDER — ACETAMINOPHEN 325 MG PO TABS: 325.0000 mg | ORAL_TABLET | Freq: Once | ORAL | Status: DC | PRN

## 2022-09-08 MED ORDER — METOCLOPRAMIDE HCL 5 MG/ML IJ SOLN: 5.0000 mg | Freq: Three times a day (TID) | INTRAMUSCULAR | Status: DC | PRN

## 2022-09-08 MED ORDER — ROSUVASTATIN CALCIUM 20 MG PO TABS
20.0000 mg | ORAL_TABLET | Freq: Every day | ORAL | Status: DC
  Administered 2022-09-09 – 2022-09-14 (×6): 20 mg via ORAL
  Filled 2022-09-08 (×6): qty 1

## 2022-09-08 MED ORDER — PROPOFOL 1000 MG/100ML IV EMUL
INTRAVENOUS | Status: AC
  Filled 2022-09-08: qty 100

## 2022-09-08 MED ORDER — HYDROMORPHONE HCL 2 MG/ML IJ SOLN
INTRAMUSCULAR | Status: AC
  Filled 2022-09-08: qty 1

## 2022-09-08 MED ORDER — METHOCARBAMOL 1000 MG/10ML IJ SOLN: 500.0000 mg | Freq: Four times a day (QID) | INTRAVENOUS | Status: DC | PRN

## 2022-09-08 MED ORDER — FENTANYL CITRATE (PF) 100 MCG/2ML IJ SOLN
INTRAMUSCULAR | Status: AC
  Filled 2022-09-08: qty 2

## 2022-09-08 MED ORDER — ACETAMINOPHEN 500 MG PO TABS: 1000.0000 mg | ORAL_TABLET | Freq: Four times a day (QID) | ORAL | Status: DC | PRN

## 2022-09-08 MED ORDER — ONDANSETRON HCL 4 MG PO TABS: 4.0000 mg | ORAL_TABLET | Freq: Four times a day (QID) | ORAL | Status: DC | PRN

## 2022-09-08 MED ORDER — LIDOCAINE HCL (CARDIAC) PF 100 MG/5ML IV SOSY
PREFILLED_SYRINGE | INTRAVENOUS | Status: DC | PRN
  Administered 2022-09-08: 50 mg via INTRAVENOUS

## 2022-09-08 MED ORDER — DULOXETINE HCL 60 MG PO CPEP
60.0000 mg | ORAL_CAPSULE | Freq: Every day | ORAL | Status: DC
  Administered 2022-09-09 – 2022-09-14 (×6): 60 mg via ORAL
  Filled 2022-09-08 (×6): qty 1

## 2022-09-08 MED ORDER — EPHEDRINE 5 MG/ML INJ
INTRAVENOUS | Status: AC
  Filled 2022-09-08: qty 5

## 2022-09-08 MED ORDER — METHOCARBAMOL 500 MG PO TABS
500.0000 mg | ORAL_TABLET | Freq: Four times a day (QID) | ORAL | Status: DC | PRN
  Administered 2022-09-08 – 2022-09-14 (×13): 500 mg via ORAL
  Filled 2022-09-08 (×14): qty 1

## 2022-09-08 MED ORDER — DEXAMETHASONE SODIUM PHOSPHATE 10 MG/ML IJ SOLN
10.0000 mg | Freq: Once | INTRAMUSCULAR | Status: AC
  Administered 2022-09-09: 10 mg via INTRAVENOUS
  Filled 2022-09-08: qty 1

## 2022-09-08 MED ORDER — ONDANSETRON HCL 4 MG/2ML IJ SOLN: 4.0000 mg | Freq: Four times a day (QID) | INTRAMUSCULAR | Status: DC | PRN

## 2022-09-08 MED ORDER — DIPHENHYDRAMINE HCL 12.5 MG/5ML PO ELIX
12.5000 mg | ORAL_SOLUTION | ORAL | Status: DC | PRN
  Administered 2022-09-09 – 2022-09-10 (×3): 25 mg via ORAL
  Filled 2022-09-08 (×3): qty 10

## 2022-09-08 MED ORDER — POLYETHYLENE GLYCOL 3350 17 G PO PACK
17.0000 g | PACK | Freq: Two times a day (BID) | ORAL | Status: DC
  Administered 2022-09-08 – 2022-09-13 (×7): 17 g via ORAL
  Filled 2022-09-08 (×9): qty 1

## 2022-09-08 MED ORDER — SODIUM CHLORIDE 0.9 % IR SOLN
Status: DC | PRN
  Administered 2022-09-08 (×2): 3000 mL

## 2022-09-08 MED ORDER — DIPHENHYDRAMINE HCL 50 MG/ML IJ SOLN: 12.5000 mg | Freq: Once | INTRAMUSCULAR | Status: DC

## 2022-09-08 MED ORDER — DEXMEDETOMIDINE HCL IN NACL 80 MCG/20ML IV SOLN
INTRAVENOUS | Status: AC
  Filled 2022-09-08: qty 20

## 2022-09-08 MED ORDER — CHLORHEXIDINE GLUCONATE 0.12 % MT SOLN
15.0000 mL | Freq: Once | OROMUCOSAL | Status: AC
  Administered 2022-09-08: 15 mL via OROMUCOSAL

## 2022-09-08 MED ORDER — METOCLOPRAMIDE HCL 5 MG PO TABS: 5.0000 mg | ORAL_TABLET | Freq: Three times a day (TID) | ORAL | Status: DC | PRN

## 2022-09-08 MED ORDER — EPHEDRINE SULFATE-NACL 50-0.9 MG/10ML-% IV SOSY
PREFILLED_SYRINGE | INTRAVENOUS | Status: DC | PRN
  Administered 2022-09-08: 10 mg via INTRAVENOUS

## 2022-09-08 MED ORDER — BISACODYL 10 MG RE SUPP: 10.0000 mg | Freq: Every day | RECTAL | Status: DC | PRN

## 2022-09-08 MED ORDER — FENTANYL CITRATE PF 50 MCG/ML IJ SOSY
PREFILLED_SYRINGE | INTRAMUSCULAR | Status: AC
  Administered 2022-09-08: 25 ug via INTRAVENOUS
  Filled 2022-09-08: qty 1

## 2022-09-08 MED ORDER — DEXAMETHASONE SODIUM PHOSPHATE 10 MG/ML IJ SOLN
8.0000 mg | Freq: Once | INTRAMUSCULAR | Status: AC
  Administered 2022-09-08: 8 mg via INTRAVENOUS

## 2022-09-08 MED ORDER — MORPHINE SULFATE (PF) 2 MG/ML IV SOLN
0.5000 mg | INTRAVENOUS | Status: DC | PRN
  Administered 2022-09-08 – 2022-09-09 (×3): 1 mg via INTRAVENOUS
  Filled 2022-09-08 (×3): qty 1

## 2022-09-08 MED ORDER — ACETAMINOPHEN 500 MG PO TABS
1000.0000 mg | ORAL_TABLET | Freq: Once | ORAL | Status: AC
  Administered 2022-09-08: 1000 mg via ORAL
  Filled 2022-09-08: qty 2

## 2022-09-08 MED ORDER — ACETAMINOPHEN 325 MG PO TABS: 325.0000 mg | ORAL_TABLET | Freq: Four times a day (QID) | ORAL | Status: DC | PRN

## 2022-09-08 MED ORDER — ALBUTEROL SULFATE HFA 108 (90 BASE) MCG/ACT IN AERS: 2.0000 | INHALATION_SPRAY | Freq: Four times a day (QID) | RESPIRATORY_TRACT | Status: DC | PRN

## 2022-09-08 MED ORDER — 0.9 % SODIUM CHLORIDE (POUR BTL) OPTIME
TOPICAL | Status: DC | PRN
  Administered 2022-09-08: 1000 mL

## 2022-09-08 MED ORDER — ACETAMINOPHEN 10 MG/ML IV SOLN: 1000.0000 mg | Freq: Once | INTRAVENOUS | Status: DC | PRN

## 2022-09-08 MED ORDER — DOCUSATE SODIUM 100 MG PO CAPS
100.0000 mg | ORAL_CAPSULE | Freq: Two times a day (BID) | ORAL | Status: DC
  Administered 2022-09-08 – 2022-09-13 (×11): 100 mg via ORAL
  Filled 2022-09-08 (×12): qty 1

## 2022-09-08 MED ORDER — SODIUM CHLORIDE 0.9 % IV SOLN: INTRAVENOUS | Status: DC

## 2022-09-08 MED ORDER — MIDAZOLAM HCL 2 MG/2ML IJ SOLN
INTRAMUSCULAR | Status: AC
  Filled 2022-09-08: qty 2

## 2022-09-08 SURGICAL SUPPLY — 46 items
ADH SKN CLS APL DERMABOND .7 (GAUZE/BANDAGES/DRESSINGS) ×1
BAG COUNTER SPONGE SURGICOUNT (BAG) IMPLANT
BAG SPEC THK2 15X12 ZIP CLS (MISCELLANEOUS) ×1
BAG SPNG CNTER NS LX DISP (BAG)
BAG ZIPLOCK 12X15 (MISCELLANEOUS) ×1 IMPLANT
BLADE SAW SGTL 11.0X1.19X90.0M (BLADE) IMPLANT
COVER SURGICAL LIGHT HANDLE (MISCELLANEOUS) ×1 IMPLANT
DERMABOND ADVANCED .7 DNX12 (GAUZE/BANDAGES/DRESSINGS) ×1 IMPLANT
DRAPE ORTHO SPLIT 77X108 STRL (DRAPES) ×2
DRAPE SURG 17X11 SM STRL (DRAPES) ×1 IMPLANT
DRAPE SURG ORHT 6 SPLT 77X108 (DRAPES) ×2 IMPLANT
DRAPE U-SHAPE 47X51 STRL (DRAPES) ×1 IMPLANT
DRSG AQUACEL AG ADV 3.5X10 (GAUZE/BANDAGES/DRESSINGS) ×1 IMPLANT
DURAPREP 26ML APPLICATOR (WOUND CARE) ×1 IMPLANT
ELECT REM PT RETURN 15FT ADLT (MISCELLANEOUS) ×1 IMPLANT
EVACUATOR 1/8 PVC DRAIN (DRAIN) ×1 IMPLANT
GAUZE PAD ABD 7.5X8 STRL (GAUZE/BANDAGES/DRESSINGS) IMPLANT
GAUZE SPONGE 2X2 8PLY STRL LF (GAUZE/BANDAGES/DRESSINGS) ×1 IMPLANT
GAUZE SPONGE 4X4 12PLY STRL (GAUZE/BANDAGES/DRESSINGS) IMPLANT
GAUZE XEROFORM 5X9 LF (GAUZE/BANDAGES/DRESSINGS) IMPLANT
GLOVE BIOGEL M 7.0 STRL (GLOVE) IMPLANT
GLOVE BIOGEL PI IND STRL 7.5 (GLOVE) ×1 IMPLANT
GLOVE BIOGEL PI IND STRL 8.5 (GLOVE) ×1 IMPLANT
GLOVE ECLIPSE 8.0 STRL XLNG CF (GLOVE) IMPLANT
GLOVE INDICATOR 6.5 STRL GRN (GLOVE) ×1 IMPLANT
GLOVE SURG ORTHO 8.0 STRL STRW (GLOVE) ×1 IMPLANT
GOWN STRL REUS W/ TWL LRG LVL3 (GOWN DISPOSABLE) ×2 IMPLANT
GOWN STRL REUS W/TWL LRG LVL3 (GOWN DISPOSABLE) ×2
HANDPIECE INTERPULSE COAX TIP (DISPOSABLE) ×1
IV NS IRRIG 3000ML ARTHROMATIC (IV SOLUTION) ×1 IMPLANT
KIT BASIN OR (CUSTOM PROCEDURE TRAY) ×1 IMPLANT
KIT TURNOVER KIT A (KITS) IMPLANT
MANIFOLD NEPTUNE II (INSTRUMENTS) ×1 IMPLANT
PACK TOTAL JOINT (CUSTOM PROCEDURE TRAY) ×1 IMPLANT
PROTECTOR NERVE ULNAR (MISCELLANEOUS) ×1 IMPLANT
SET HNDPC FAN SPRY TIP SCT (DISPOSABLE) ×1 IMPLANT
SOLUTION IRRIG SURGIPHOR (IV SOLUTION) IMPLANT
STAPLER VISISTAT 35W (STAPLE) ×1 IMPLANT
SUT ETHILON 2 0 PS N (SUTURE) IMPLANT
SUT MNCRL AB 4-0 PS2 18 (SUTURE) ×1 IMPLANT
SUT VIC AB 1 CT1 36 (SUTURE) ×2 IMPLANT
SUT VIC AB 2-0 CT1 27 (SUTURE) ×2
SUT VIC AB 2-0 CT1 TAPERPNT 27 (SUTURE) ×2 IMPLANT
SWAB COLLECTION DEVICE MRSA (MISCELLANEOUS) ×1 IMPLANT
SWAB CULTURE ESWAB REG 1ML (MISCELLANEOUS) ×1 IMPLANT
TOWEL OR 17X26 10 PK STRL BLUE (TOWEL DISPOSABLE) ×2 IMPLANT

## 2022-09-08 NOTE — Discharge Instructions (Signed)
INSTRUCTIONS AFTER SURGERY  Remove items at home which could result in a fall. This includes throw rugs or furniture in walking pathways ICE to the affected joint every three hours while awake for 30 minutes at a time, for at least the first 3-5 days, and then as needed for pain and swelling.  Continue to use ice for pain and swelling. You may notice swelling that will progress down to the foot and ankle.  This is normal after surgery.  Elevate your leg when you are not up walking on it.   Continue to use the breathing machine you got in the hospital (incentive spirometer) which will help keep your temperature down.  It is common for your temperature to cycle up and down following surgery, especially at night when you are not up moving around and exerting yourself.  The breathing machine keeps your lungs expanded and your temperature down.   DIET:  As you were doing prior to hospitalization, we recommend a well-balanced diet.  DRESSING / WOUND CARE / SHOWERING  You may shower 3 days after surgery, but keep the wounds dry during showering.  You may use an occlusive plastic wrap (Press'n Seal for example), NO SOAKING/SUBMERGING IN THE BATHTUB.  If the bandage gets wet, change with a clean dry gauze.  If the incision gets wet, pat the wound dry with a clean towel.  ACTIVITY  Increase activity slowly as tolerated, but follow the weight bearing instructions below.   No driving for 6 weeks or until further direction given by your physician.  You cannot drive while taking narcotics.  No lifting or carrying greater than 10 lbs. until further directed by your surgeon. Avoid periods of inactivity such as sitting longer than an hour when not asleep. This helps prevent blood clots.  You may return to work once you are authorized by your doctor.     WEIGHT BEARING   Weight bearing as tolerated with assist device (walker, cane, etc) as directed, use it as long as suggested by your surgeon or therapist,  typically at least 4-6 weeks.  CONSTIPATION  Constipation is defined medically as fewer than three stools per week and severe constipation as less than one stool per week.  Even if you have a regular bowel pattern at home, your normal regimen is likely to be disrupted due to multiple reasons following surgery.  Combination of anesthesia, postoperative narcotics, change in appetite and fluid intake all can affect your bowels.   YOU MUST use at least one of the following options; they are listed in order of increasing strength to get the job done.  They are all available over the counter, and you may need to use some, POSSIBLY even all of these options:    Drink plenty of fluids (prune juice may be helpful) and high fiber foods Colace 100 mg by mouth twice a day  Senokot for constipation as directed and as needed Dulcolax (bisacodyl), take with full glass of water  Miralax (polyethylene glycol) once or twice a day as needed.  If you have tried all these things and are unable to have a bowel movement in the first 3-4 days after surgery call either your surgeon or your primary doctor.    If you experience loose stools or diarrhea, hold the medications until you stool forms back up.  If your symptoms do not get better within 1 week or if they get worse, check with your doctor.  If you experience "the worst abdominal pain ever"  or develop nausea or vomiting, please contact the office immediately for further recommendations for treatment.   ITCHING:  If you experience itching with your medications, try taking only a single pain pill, or even half a pain pill at a time.  You can also use Benadryl over the counter for itching or also to help with sleep.   TED HOSE STOCKINGS:  Use stockings on both legs until for at least 2 weeks or as directed by physician office. They may be removed at night for sleeping.  MEDICATIONS:  See your medication summary on the "After Visit Summary" that nursing will review  with you.  You may have some home medications which will be placed on hold until you complete the course of blood thinner medication.  It is important for you to complete the blood thinner medication as prescribed.  PRECAUTIONS:  If you experience chest pain or shortness of breath - call 911 immediately for transfer to the hospital emergency department.   If you develop a fever greater that 101 F, purulent drainage from wound, increased redness or drainage from wound, foul odor from the wound/dressing, or calf pain - CONTACT YOUR SURGEON.                                                   FOLLOW-UP APPOINTMENTS:  If you do not already have a post-op appointment, please call the office for an appointment to be seen by your surgeon.  Guidelines for how soon to be seen are listed in your "After Visit Summary", but are typically between 1-4 weeks after surgery.  POST-OPERATIVE OPIOID TAPER INSTRUCTIONS: It is important to wean off of your opioid medication as soon as possible. If you do not need pain medication after your surgery it is ok to stop day one. Opioids include: Codeine, Hydrocodone(Norco, Vicodin), Oxycodone(Percocet, oxycontin) and hydromorphone amongst others.  Long term and even short term use of opiods can cause: Increased pain response Dependence Constipation Depression Respiratory depression And more.  Withdrawal symptoms can include Flu like symptoms Nausea, vomiting And more Techniques to manage these symptoms Hydrate well Eat regular healthy meals Stay active Use relaxation techniques(deep breathing, meditating, yoga) Do Not substitute Alcohol to help with tapering If you have been on opioids for less than two weeks and do not have pain than it is ok to stop all together.  Plan to wean off of opioids This plan should start within one week post op of your joint replacement. Maintain the same interval or time between taking each dose and first decrease the dose.  Cut the  total daily intake of opioids by one tablet each day Next start to increase the time between doses. The last dose that should be eliminated is the evening dose.   MAKE SURE YOU:  Understand these instructions.  Get help right away if you are not doing well or get worse.    Thank you for letting us be a part of your medical care team.  It is a privilege we respect greatly.  We hope these instructions will help you stay on track for a fast and full recovery!

## 2022-09-08 NOTE — Transfer of Care (Signed)
Immediate Anesthesia Transfer of Care Note  Patient: Wess Botts  Procedure(s) Performed: IRRIGATION AND DEBRIDEMENT HIP (Right: Hip)  Patient Location: PACU  Anesthesia Type:General  Level of Consciousness: drowsy and patient cooperative  Airway & Oxygen Therapy: Patient Spontanous Breathing and Patient connected to face mask oxygen  Post-op Assessment: Report given to RN and Post -op Vital signs reviewed and stable  Post vital signs: Reviewed and stable  Last Vitals:  Vitals Value Taken Time  BP 133/89 09/08/22 1446  Temp 36.4 C 09/08/22 1445  Pulse 76 09/08/22 1448  Resp 11 09/08/22 1448  SpO2 100 % 09/08/22 1448  Vitals shown include unfiled device data.  Last Pain:  Vitals:   09/08/22 1221  TempSrc:   PainSc: 5       Patients Stated Pain Goal: 3 (09/08/22 1105)  Complications: No notable events documented.

## 2022-09-08 NOTE — H&P (Signed)
ORTHO ADMISSION H&P  Patient is admitted for I&D of the right hip   Subjective:  Chief Complaint: right hip pain, drainage  HPI: Reginald Walsh is a 57 year old male who is status post right total hip arthroplasty in March 2023. He initially was doing well, but developed late onset acute cellulitic response around his right hip. This was initially managed and controlled with oral antibiotics however upon cessation of the antibiotics the erythema recurred and there was noted to be a small area of drainage. He was subsequently taken to the operating room in June 2023 and had his right hip opened and debrided down to the joint. He had been placed on daptomycin and ceftriaxone postoperatively. At the time of that procedure I elected to close his wound with nylon sutures. He had unfortunately persistent drainage and incomplete wound healing.   He was taken to the OR on 07/27/21 for excisional and nonexcisional debridement with primary wound closure. He recovered well from this surgery, without signs of recurrent infection.  He then presented in July 2024 after increased pain for several weeks and the development of blistering around his incision. Dr. Charlann Boxer aspirated 80 cc of fluid from the superficial space around his hip, which was bloody with a tinge of purulence. This was sent for synovasure analysis despite his chronic use of antibiotics. These were positive for infection, and I will separately attach these results.   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   Cellulitis of right lower limb 07/01/2021   Chronic sinusitis 07/01/2021    Complication of surgical procedure 07/01/2021   Depression 07/01/2021   Eczema 07/01/2021   Achilles tendinitis 07/01/2021   Flat foot 07/01/2021   History of alcohol abuse 07/01/2021   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   Other and unspecified noninfectious gastroenteritis and colitis 07/01/2021   Other osteonecrosis, 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HCC) 07/01/2021   Presbyopia 07/01/2021   Thoracic or lumbosacral neuritis or radiculitis 07/01/2021   Tobacco use disorder 07/01/2021   Vitamin D deficiency 07/01/2021   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   History of total left hip arthroplasty 07/01/2021   Hyponatremia 04/21/2021   Hypomagnesemia 04/21/2021   Hypokalemia 04/21/2021   Chronic post-traumatic stress disorder 04/21/2021   Chronic obstructive pulmonary disease, unspecified (HCC) 04/21/2021   Gastroesophageal reflux disease 04/21/2021   Major depressive disorder, recurrent, moderate (HCC) 04/21/2021   Hyperlipidemia 04/21/2021   Cervical spondylosis 12/06/2019   Essential hypertension 06/02/2019   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HCC) 06/02/2019   Cervical radiculopathy 10/21/2016   Chronic back pain 04/21/2016   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   40 mm by echo   Chest pain of uncertain etiology    Normal coronaries after abnormal Myoview Feb 2021 Echo March 2021 showed normal LVF- mild LVH    Chest pain of unknown etiology 02/2019   normal coronaries after abnormal Myoview   COPD (chronic obstructive pulmonary disease) (HCC)    Depression    Dyspnea    Encounter  for screening for COVID-19 07/01/2021   Generalized enlarged lymph nodes 07/01/2021   GERD (gastroesophageal reflux disease)    Hypertension    normal RA dopplers   Hyponatremia    Other allergy status, other than to drugs and biological substances 07/01/2021   Other and unspecified  complications of medical care, not elsewhere classified 07/01/2021   Other ill-defined and unknown causes of morbidity and mortality 07/01/2021   Overweight    Pain in left knee 07/01/2021   Pain in right foot 07/01/2021   PTSD (post-traumatic stress disorder)     Past Surgical History:  Procedure Laterality Date   HEMATOMA EVACUATION Right 07/27/2021   Procedure: EVACUATION HEMATOMA;  Surgeon: Durene Romans, MD;  Location: [facility];  Service: Orthopedics;  Laterality: Right;   HIP ARTHROPLASTY Left 2021   HIP ARTHROPLASTY Right 2023   INCISION AND DRAINAGE Right 07/27/2021   Procedure: INCISIONAL AND NON-INCISIONAL WOUND DEBRIDEMENT AND PRIMARY WOUND CLOSURE;  Surgeon: Durene Romans, MD;  Location: [facility];  Service: Orthopedics;  Laterality: Right;   INCISION AND DRAINAGE HIP Right 07/08/2021   Procedure: EXCISIONAL AND NON EXCISIONAL DEBRIDEMENT HIP;  Surgeon: Durene Romans, MD;  Location: [facility];  Service: Orthopedics;  Laterality: Right;   KNEE ARTHROSCOPY Left 2008   LEFT HEART CATH AND CORONARY ANGIOGRAPHY N/A 03/21/2019   Procedure: LEFT HEART CATH AND CORONARY ANGIOGRAPHY;  Surgeon: Kathleene Hazel, MD;  Location: [facility];  Service: Cardiovascular;  Laterality: N/A;   LUMBAR LAMINECTOMY  2000   TENDON RELEASE Right 2018   foot   WISDOM TOOTH EXTRACTION     age 45    No current facility-administered medications for this encounter.   Current Outpatient Medications  Medication Sig Dispense Refill Last Dose   albuterol (VENTOLIN HFA) 108 (90 Base) MCG/ACT inhaler Inhale 2 puffs into the lungs every 6 (six) hours as needed for wheezing or shortness of breath.      augmented betamethasone dipropionate (DIPROLENE-AF) 0.05 % cream Apply 1 application topically 2 (two) times daily as needed (eczema).      carvedilol (COREG) 25 MG tablet Take 0.5 tablets (12.5 mg total) by mouth 2 (two) times daily. 60 tablet 0    DULoxetine (CYMBALTA) 60 MG capsule Take 60 mg by mouth  daily.      fluticasone (FLONASE) 50 MCG/ACT nasal spray Place 2 sprays into both nostrils in the morning and at bedtime. 18.2 mL 5    HYDROcodone-acetaminophen (NORCO/VICODIN) 5-325 MG tablet Take 1 tablet by mouth every 6 (six) hours as needed for moderate pain or severe pain.      hydrocortisone 2.5 % cream Apply topically 2 (two) times daily. (Patient taking differently: Apply 1 Application topically daily as needed (itching).) 30 g 3    Ipratropium-Albuterol (COMBIVENT) 20-100 MCG/ACT AERS respimat Inhale 1 puff into the lungs in the morning and at bedtime.      levofloxacin (LEVAQUIN) 750 MG tablet Take 750 mg by mouth daily. (Patient not taking: Reported on 09/07/2022)      linezolid (ZYVOX) 600 MG tablet Take 600 mg by mouth 2 (two) times daily. (Patient not taking: Reported on 09/07/2022)      montelukast (SINGULAIR) 10 MG tablet TAKE 1 TABLET BY MOUTH EVERY DAY 90 tablet 1    Multiple Vitamins-Minerals (MENS MULTIVITAMIN PLUS PO) Take 1 tablet by mouth daily.       pantoprazole (PROTONIX) 40 MG tablet Take 40 mg by mouth daily.      REFRESH TEARS 0.5 %  SOLN Place 1 drop into both eyes 3 (three) times daily as needed (dry eyes).      rosuvastatin (CRESTOR) 20 MG tablet TAKE 1 TABLET BY MOUTH EVERY DAY 90 tablet 1    Vitamin D, Ergocalciferol, (DRISDOL) 1.25 MG (50000 UNIT) CAPS capsule Take 50,000 Units by mouth once a week.      influenza vac split quadrivalent PF (FLUARIX) 0.5 ML injection Inject into the muscle. 0.5 mL 0    Allergies  Allergen Reactions   Pollen Extract Anaphylaxis    Combined with congestion   Poison Ivy Extract Hives   Iron Nausea And Vomiting   Tegaderm Alginate Ag Rope Other (See Comments)    Can use plain Tegaderm   Escitalopram Oxalate Anxiety    Social History   Tobacco Use   Smoking status: Former    Current packs/day: 0.00    Average packs/day: 0.5 packs/day for 7.0 years (3.5 ttl pk-yrs)    Types: Cigarettes    Start date: 76    Quit date: 1989     Years since quitting: 35.6   Smokeless tobacco: Current    Types: Snuff, Chew  Substance Use Topics   Alcohol use: Yes    Alcohol/week: 1.0 standard drink of alcohol    Types: 1 Cans of beer per week    Comment: occasional    Family History  Adopted: Yes  Problem Relation Age of Onset   Lung cancer Father    Prostate cancer Father        Posey Rea of age of onset     Review of Systems  Objective:  Physical Exam  Vital signs in last 24 hours: Temp:  [98.2 F (36.8 C)] 98.2 F (36.8 C) (08/12 0946) Pulse Rate:  [78] 78 (08/12 0946) Resp:  [16] 16 (08/12 0946) BP: (119)/(93) 119/93 (08/12 0946) SpO2:  [97 %] 97 % (08/12 0946) Weight:  [104.3 kg] 104.3 kg (08/12 0946)  Labs:   Estimated body mass index is 28.75 kg/m as calculated from the following:   Height as of 09/07/22: 6\' 3"  (1.905 m).   Weight as of 09/07/22: 104.3 kg.   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 The treatment options including medical management, I&D, and 1 vs 2 stage revision were discussed at length. The risks and benefits of I&D were presented and reviewed. The risks due to aseptic loosening, recurrent infection, stiffness, dislocation/subluxation,  thromboembolic complications and other imponderables were discussed.  The patient acknowledged the explanation, agreed to proceed with the plan and consent was signed. Patient is being admitted for inpatient treatment for surgery, pain control, PT, OT, prophylactic antibiotics, VTE prophylaxis, progressive ambulation and ADL's and discharge planning.The patient is planning to be discharged  home.   Rosalene Billings, PA-C Orthopedic Surgery [facility] [place] [phone]

## 2022-09-08 NOTE — Brief Op Note (Signed)
09/08/2022  3:59 PM  PATIENT:  Wess Botts  57 y.o. male  PRE-OPERATIVE DIAGNOSIS:  Right hip superfical versus deep infection, status post right total hip arthroplasty  POST-OPERATIVE DIAGNOSIS:  Right hip superfical versus deep infection, status post right total hip arthroplasty  PROCEDURE:  Procedure(s): IRRIGATION AND DEBRIDEMENT HIP (Right)  SURGEON:  Surgeons and Role:    Durene Romans, MD - Primary  PHYSICIAN ASSISTANT: Rosalene Billings, PA-C  ANESTHESIA:   spinal  EBL:  600 mL   BLOOD ADMINISTERED:none  DRAINS: (1 medium) Hemovact drain(s) in the right superficial hip with  Suction Open   LOCAL MEDICATIONS USED:  NONE  SPECIMEN:  Source of Specimen:  right hip fluid and tissue  DISPOSITION OF SPECIMEN:  PATHOLOGY  COUNTS:  YES  TOURNIQUET:  * No tourniquets in log *  DICTATION: .Other Dictation: Dictation Number [id number]  PLAN OF CARE: Admit to inpatient   PATIENT DISPOSITION:  PACU - hemodynamically stable.   Delay start of Pharmacological VTE agent (>24hrs) due to surgical blood loss or risk of bleeding: no

## 2022-09-08 NOTE — Plan of Care (Signed)
  Problem: Activity: Goal: Ability to avoid complications of mobility impairment will improve Outcome: Progressing Goal: Ability to tolerate increased activity will improve Outcome: Progressing   Problem: Safety: Goal: Ability to remain free from injury will improve Outcome: Progressing   Problem: Pain Managment: Goal: General experience of comfort will improve Outcome: Progressing   

## 2022-09-08 NOTE — Op Note (Signed)
NAMEDESTINY, SCHMUCK MEDICAL RECORD NO: [id number] ACCOUNT NO: [id number] DATE OF BIRTH: 27-Jun-1965 FACILITY: [facility] LOCATION: [facility] PHYSICIAN: Madlyn Frankel. Charlann Boxer, MD  Operative Report   DATE OF PROCEDURE: 09/08/2022  PREOPERATIVE DIAGNOSIS:  Right hip superficial versus deep fungal infection status post right total hip arthroplasty.  POSTOPERATIVE DIAGNOSIS:  Right hip superficial versus deep fungal infection status post right total hip arthroplasty.  PROCEDURE:  Excisional and non-excisional debridement of right hip.  Excisional debridement was carried out sharply with a knife as well as a Bovie over a 6-inch incision including skin and nonviable subcutaneous tissue, fat, scar and muscle fascia.  A  non-excisional debridement was carried out with 6 liters of normal saline solution as well as 450 mL of Surgiphor Betadine based product as well as 400+ mL of Prontosan antimicrobial fluid.  SURGEON:  Madlyn Frankel. Charlann Boxer, MD  ASSISTANT:  Rosalene Billings, PA-C.  Note that Ms. Domenic Schwab was present for the entirety of the case from preoperative positioning, perioperative management of the operative extremity, general facilitation of the case and primary wound closure.  ANESTHESIA:  Spinal.  BLOOD LOSS:  600 mL.  DRAINS:  1 medium Hemovac drain.  COMPLICATIONS:  None apparent.  INDICATIONS FOR PROCEDURE:  The patient is a pleasant 57 year old male with history of right total hip arthroplasty in 2023.  His postoperative course was unfortunately complicated by superficial infected seroma.  This was diagnosed I believe with a  staph species.  He underwent I and D of the right hip.  He was placed on antibiotics and was on suppressive doxycycline.  He has been doing very well until recently when he presented to the office last week with increased swelling, pain and concern for  wound breakdown anteriorly.  In the office, I aspirated his hip.  Preliminary cultures came back concerning for yeast with  eventual culture growth of Candida.  We had lengthy discussion in the office regarding the significant challenges regarding  management of yeast infections.  I had some initial concerns regarding his skin integrity.  Given the infrequent nature in terms of management of yeast infections I at least wanted to superficially debride this wound from a wound standpoint, but also to  try to provide some relief and prevent further spread of illness.  We did discuss that ultimately he may necessitate more definitive treatment in the form of resection arthroplasty.  I reviewed with him the risks and the potential need for future  surgery.  At the time of this charting I will subsequently review with the patient that I may choose to refer him to a tertiary care center for definitive management of yeast infection based on the challenges of this unfortunate and rare infection  etiology.  Consent was obtained for management of his right hip currently.  DESCRIPTION OF PROCEDURE:  The patient was brought to the operative theater.  Once adequate anesthesia, preoperative antibiotics were administered, which was Ancef.  He had been placed on fluconazole orally over the weekend.  He was positioned supine on  the Hana table.  Once adequately positioned and bony prominences safely padded his right hip was then prepped and draped in sterile fashion.  A timeout was performed identifying the patient, planned procedure, and extremity.  At this point, I excised his  incision.  I first made a linear incision along one of the borders of his slightly widened scar in the area of where he was having some early drainage.  Once we got through the subcutaneous fat,  skin and subcutaneous layer, we encountered a fluid  collection.  We did our best at this point to aspirate about 15 mL of fluid from the hip.  We had already aspirated in the office and had culture results.  However, we were obtaining new cultures including fungal specimens here.   Following this, initial  opening of the hip and evacuation of this fluid I sharply excised the skin edges on the posterior side of the incision and then used a knife and the Bovie to perform a sharp excisional debridement including the subcutaneous skin, subdermal tissue, fat as  well as muscle fascia.  I was unable to directly palpate or visualize the joint at this point.  Given the nature of his infection, I do have concerns that this is a deep infection, but I did not open up his joint today.  Once I performed the excisional  debridements, we irrigated the hip first with 3 liters of normal saline solution followed by the bottles of Surgiphor and Prontosan.  Once this was completed, we re-irrigated with 3 liters of normal saline solution.  Based on his inflammatory nature to  the infection he has a lot of punctate bleeding that was difficult to obtain full hemostasis.  I placed a medium Hemovac drain into the subcutaneous layer.  I then reapproximated the skin edges using 2-0 nylon.  The wound was clean, dry and dressed  sterilely using Xeroform and a bulky dressing.  He was transferred to the recovery room in stable condition, tolerating the procedure well with a Hemovac in place.  Postoperatively, we will consult infectious disease to determine next steps whether or not it is IV and/or oral antifungals.  Given the fact I may very well decide to try to get his care at a tertiary care center based on the nature of the fungal  infection.  I reviewed these findings with his wife.  He will be admitted to the hospital.  We will keep in the hospital for pain control as well as work on consultation about the infection.   PUS D: 09/08/2022 3:59:11 pm T: 09/08/2022 4:23:00 pm  JOB: [id number]/ [id number]

## 2022-09-08 NOTE — Anesthesia Procedure Notes (Signed)
Procedure Name: LMA Insertion Date/Time: 09/08/2022 1:03 PM  Performed by: Oletha Cruel, CRNAPre-anesthesia Checklist: Patient identified, Emergency Drugs available, Suction available and Patient being monitored Patient Re-evaluated:Patient Re-evaluated prior to induction Oxygen Delivery Method: Circle system utilized Preoxygenation: Pre-oxygenation with 100% oxygen Induction Type: IV induction Ventilation: Mask ventilation without difficulty LMA: LMA inserted LMA Size: 5.0 Number of attempts: 1 Tube secured with: Tape Dental Injury: Teeth and Oropharynx as per pre-operative assessment

## 2022-09-09 ENCOUNTER — Encounter (HOSPITAL_COMMUNITY): Payer: Self-pay | Admitting: Orthopedic Surgery

## 2022-09-09 DIAGNOSIS — Z79899 Other long term (current) drug therapy: Secondary | ICD-10-CM | POA: Diagnosis not present

## 2022-09-09 DIAGNOSIS — Z8616 Personal history of COVID-19: Secondary | ICD-10-CM | POA: Diagnosis not present

## 2022-09-09 DIAGNOSIS — D62 Acute posthemorrhagic anemia: Secondary | ICD-10-CM | POA: Diagnosis not present

## 2022-09-09 DIAGNOSIS — Z6828 Body mass index (BMI) 28.0-28.9, adult: Secondary | ICD-10-CM | POA: Diagnosis not present

## 2022-09-09 DIAGNOSIS — Z87892 Personal history of anaphylaxis: Secondary | ICD-10-CM | POA: Diagnosis not present

## 2022-09-09 DIAGNOSIS — B49 Unspecified mycosis: Secondary | ICD-10-CM | POA: Diagnosis not present

## 2022-09-09 DIAGNOSIS — M96842 Postprocedural seroma of a musculoskeletal structure following a musculoskeletal system procedure: Secondary | ICD-10-CM | POA: Diagnosis present

## 2022-09-09 DIAGNOSIS — F4312 Post-traumatic stress disorder, chronic: Secondary | ICD-10-CM | POA: Diagnosis present

## 2022-09-09 DIAGNOSIS — Z792 Long term (current) use of antibiotics: Secondary | ICD-10-CM | POA: Diagnosis not present

## 2022-09-09 DIAGNOSIS — Z8042 Family history of malignant neoplasm of prostate: Secondary | ICD-10-CM | POA: Diagnosis not present

## 2022-09-09 DIAGNOSIS — J4489 Other specified chronic obstructive pulmonary disease: Secondary | ICD-10-CM | POA: Diagnosis present

## 2022-09-09 DIAGNOSIS — Z801 Family history of malignant neoplasm of trachea, bronchus and lung: Secondary | ICD-10-CM | POA: Diagnosis not present

## 2022-09-09 DIAGNOSIS — Z86010 Personal history of colonic polyps: Secondary | ICD-10-CM | POA: Diagnosis not present

## 2022-09-09 DIAGNOSIS — I10 Essential (primary) hypertension: Secondary | ICD-10-CM | POA: Diagnosis present

## 2022-09-09 DIAGNOSIS — K219 Gastro-esophageal reflux disease without esophagitis: Secondary | ICD-10-CM | POA: Diagnosis present

## 2022-09-09 DIAGNOSIS — B3789 Other sites of candidiasis: Secondary | ICD-10-CM | POA: Diagnosis present

## 2022-09-09 DIAGNOSIS — D649 Anemia, unspecified: Secondary | ICD-10-CM | POA: Diagnosis not present

## 2022-09-09 DIAGNOSIS — F17290 Nicotine dependence, other tobacco product, uncomplicated: Secondary | ICD-10-CM | POA: Diagnosis present

## 2022-09-09 DIAGNOSIS — Z96642 Presence of left artificial hip joint: Secondary | ICD-10-CM | POA: Diagnosis present

## 2022-09-09 DIAGNOSIS — T8451XD Infection and inflammatory reaction due to internal right hip prosthesis, subsequent encounter: Secondary | ICD-10-CM | POA: Diagnosis not present

## 2022-09-09 DIAGNOSIS — F17220 Nicotine dependence, chewing tobacco, uncomplicated: Secondary | ICD-10-CM | POA: Diagnosis present

## 2022-09-09 DIAGNOSIS — Y831 Surgical operation with implant of artificial internal device as the cause of abnormal reaction of the patient, or of later complication, without mention of misadventure at the time of the procedure: Secondary | ICD-10-CM | POA: Diagnosis present

## 2022-09-09 DIAGNOSIS — T8451XA Infection and inflammatory reaction due to internal right hip prosthesis, initial encounter: Secondary | ICD-10-CM | POA: Diagnosis present

## 2022-09-09 DIAGNOSIS — Z9109 Other allergy status, other than to drugs and biological substances: Secondary | ICD-10-CM | POA: Diagnosis not present

## 2022-09-09 DIAGNOSIS — E871 Hypo-osmolality and hyponatremia: Secondary | ICD-10-CM | POA: Diagnosis present

## 2022-09-09 DIAGNOSIS — E785 Hyperlipidemia, unspecified: Secondary | ICD-10-CM | POA: Diagnosis present

## 2022-09-09 DIAGNOSIS — Z888 Allergy status to other drugs, medicaments and biological substances status: Secondary | ICD-10-CM | POA: Diagnosis not present

## 2022-09-09 MED ORDER — HYDROMORPHONE HCL 1 MG/ML IJ SOLN
0.5000 mg | INTRAMUSCULAR | Status: DC | PRN
  Administered 2022-09-09 – 2022-09-14 (×21): 1 mg via INTRAVENOUS
  Filled 2022-09-09 (×21): qty 1

## 2022-09-09 MED ORDER — FLUCONAZOLE 100 MG PO TABS
400.0000 mg | ORAL_TABLET | Freq: Every day | ORAL | Status: DC
  Administered 2022-09-09 – 2022-09-14 (×6): 400 mg via ORAL
  Filled 2022-09-09 (×6): qty 4

## 2022-09-09 NOTE — Anesthesia Postprocedure Evaluation (Addendum)
Anesthesia Post Note  Patient: Reginald Walsh  Procedure(s) Performed: IRRIGATION AND DEBRIDEMENT HIP (Right: Hip)     Patient location during evaluation: PACU Anesthesia Type: General Level of consciousness: awake and alert Pain management: pain level controlled Vital Signs Assessment: post-procedure vital signs reviewed and stable Respiratory status: spontaneous breathing, nonlabored ventilation, respiratory function stable and patient connected to nasal cannula oxygen Cardiovascular status: blood pressure returned to baseline and stable Postop Assessment: no apparent nausea or vomiting Anesthetic complications: no  No notable events documented.               Shelton Silvas

## 2022-09-09 NOTE — Progress Notes (Signed)
Physical Therapy Treatment Patient Details Name: Reginald Walsh MRN: [id number] DOB: 06-02-1965 Today's Date: 09/09/2022   History of Present Illness 57 y.o. male admitted 09/08/22 for R hip I&D. PMH: R THA 03/2021, R hip I&D June 2023 and July 2023, COPD, HTN, PTSD. Pt stated plan is for R THA to be resected at [facility] in the future, date is TBD.    PT Comments  Pt is progressing well with mobility, he ambulated 180' with RW, no loss of balance. Pt performed ROM exercises for R hip without difficulty.     If plan is discharge home, recommend the following: A little help with bathing/dressing/bathroom;Assistance with cooking/housework;Assist for transportation;Help with stairs or ramp for entrance   Can travel by private vehicle        Equipment Recommendations  None recommended by PT    Recommendations for Other Services       Precautions / Restrictions Precautions Precautions: Fall Restrictions Weight Bearing Restrictions: No     Mobility  Bed Mobility Overal bed mobility: Modified Independent             General bed mobility comments: HOB up, used rail    Transfers Overall transfer level: Needs assistance Equipment used: Rolling walker (2 wheels) Transfers: Sit to/from Stand Sit to Stand: Contact guard assist, From elevated surface           General transfer comment: VCs hand placement    Ambulation/Gait Ambulation/Gait assistance: Contact guard assist Gait Distance (Feet): 180 Feet Assistive device: Rolling walker (2 wheels) Gait Pattern/deviations: Step-through pattern, Decreased stride length Gait velocity: decr     General Gait Details: steady, no loss of balance   Stairs             Wheelchair Mobility     Tilt Bed    Modified Rankin (Stroke Patients Only)       Balance Overall balance assessment: Modified Independent                                          Cognition Arousal: Alert Behavior During Therapy:  WFL for tasks assessed/performed Overall Cognitive Status: Within Functional Limits for tasks assessed                                          Exercises Total Joint Exercises Ankle Circles/Pumps: AROM, Both, 10 reps, Supine Quad Sets: AROM, Both, 5 reps, Supine Short Arc Quad: AROM, Right, 10 reps, Supine Heel Slides: AAROM, Right, Supine, 10 reps Hip ABduction/ADduction: AAROM, Right, Supine, 10 reps    General Comments        Pertinent Vitals/Pain Pain Assessment Pain Assessment: 0-10 Pain Score: 6  Pain Location: R hip Pain Descriptors / Indicators: Sore Pain Intervention(s): Limited activity within patient's tolerance, Monitored during session, Premedicated before session (pt declined ice)    Home Living Family/patient expects to be discharged to:: Private residence Living Arrangements: Spouse/significant other Available Help at Discharge: Family;Available 24 hours/day Type of Home: House Home Access: Stairs to enter Entrance Stairs-Rails: Can reach both;Left;Right Entrance Stairs-Number of Steps: 4 Alternate Level Stairs-Number of Steps: 10 Home Layout: Two level;Able to live on main level with bedroom/bathroom Home Equipment: Rolling Walker (2 wheels);Cane - quad      Prior Function  PT Goals (current goals can now be found in the care plan section) Acute Rehab PT Goals Patient Stated Goal: walk without pain PT Goal Formulation: With patient/family Time For Goal Achievement: 09/16/22 Potential to Achieve Goals: Good Progress towards PT goals: Progressing toward goals    Frequency    7X/week      PT Plan      Co-evaluation              AM-PAC PT "6 Clicks" Mobility   Outcome Measure  Help needed turning from your back to your side while in a flat bed without using bedrails?: A Little Help needed moving from lying on your back to sitting on the side of a flat bed without using bedrails?: A Little Help needed  moving to and from a bed to a chair (including a wheelchair)?: A Little Help needed standing up from a chair using your arms (e.g., wheelchair or bedside chair)?: A Little Help needed to walk in hospital room?: A Little Help needed climbing 3-5 steps with a railing? : A Little 6 Click Score: 18    End of Session Equipment Utilized During Treatment: Gait belt Activity Tolerance: Patient tolerated treatment well Patient left: in bed;with call bell/phone within reach;with bed alarm set;with nursing/sitter in room Nurse Communication: Mobility status PT Visit Diagnosis: Difficulty in walking, not elsewhere classified (R26.2);Other abnormalities of gait and mobility (R26.89)     Time: 7829-5621 PT Time Calculation (min) (ACUTE ONLY): 22 min  Charges:    $Gait Training: 8-22 mins PT General Charges $$ ACUTE PT VISIT: 1 Visit                     Ralene Bathe Kistler PT 09/09/2022  Acute Rehabilitation Services  Office [phone]

## 2022-09-09 NOTE — Evaluation (Signed)
Physical Therapy Evaluation Patient Details Name: Reginald Walsh MRN: [id number] DOB: Feb 04, 1965 Today's Date: 09/09/2022  History of Present Illness  57 y.o. male admitted 09/08/22 for R hip I&D. PMH: R THA 03/2021, R hip I&D June 2023 and July 2023, COPD, HTN, PTSD. Pt stated plan is for R THA to be resected at [facility] in the future.  Clinical Impression  Pt admitted with above diagnosis. Pt ambulated 160' with RW, no loss of balance. Initiated HEP. Good progress expected.  Pt currently with functional limitations due to the deficits listed below (see PT Problem List). Pt will benefit from acute skilled PT to increase their independence and safety with mobility to allow discharge.           If plan is discharge home, recommend the following: A little help with bathing/dressing/bathroom;Assistance with cooking/housework;Assist for transportation;Help with stairs or ramp for entrance   Can travel by private vehicle        Equipment Recommendations None recommended by PT  Recommendations for Other Services       Functional Status Assessment Patient has had a recent decline in their functional status and demonstrates the ability to make significant improvements in function in a reasonable and predictable amount of time.     Precautions / Restrictions Precautions Precautions: Fall Restrictions Weight Bearing Restrictions: No      Mobility  Bed Mobility Overal bed mobility: Modified Independent             General bed mobility comments: HOB up, used rail    Transfers Overall transfer level: Needs assistance Equipment used: Rolling walker (2 wheels) Transfers: Sit to/from Stand Sit to Stand: Contact guard assist, From elevated surface           General transfer comment: VCs hand placement    Ambulation/Gait Ambulation/Gait assistance: Contact guard assist Gait Distance (Feet): 160 Feet Assistive device: Rolling walker (2 wheels) Gait Pattern/deviations: Step-through  pattern, Decreased stride length Gait velocity: decr     General Gait Details: steady, no loss of balance  Stairs            Wheelchair Mobility     Tilt Bed    Modified Rankin (Stroke Patients Only)       Balance Overall balance assessment: Modified Independent                                           Pertinent Vitals/Pain Pain Assessment Pain Assessment: 0-10 Pain Score: 7  Pain Location: R hip Pain Descriptors / Indicators: Sore Pain Intervention(s): Limited activity within patient's tolerance, Monitored during session, Premedicated before session    Home Living Family/patient expects to be discharged to:: Private residence Living Arrangements: Spouse/significant other Available Help at Discharge: Family;Available 24 hours/day Type of Home: House Home Access: Stairs to enter Entrance Stairs-Rails: Can reach both;Left;Right Entrance Stairs-Number of Steps: 4 Alternate Level Stairs-Number of Steps: 10 Home Layout: Two level;Able to live on main level with bedroom/bathroom Home Equipment: Rolling Walker (2 wheels);Cane - quad      Prior Function Prior Level of Function : Independent/Modified Independent             Mobility Comments: walked without AD       Extremity/Trunk Assessment   Upper Extremity Assessment Upper Extremity Assessment: Overall WFL for tasks assessed    Lower Extremity Assessment Lower Extremity Assessment: RLE deficits/detail RLE Deficits / Details:  hip flexion -3/5, hip AAROM [facility] RLE Sensation: WNL    Cervical / Trunk Assessment Cervical / Trunk Assessment: Normal  Communication   Communication Communication: No apparent difficulties  Cognition Arousal: Alert Behavior During Therapy: WFL for tasks assessed/performed Overall Cognitive Status: Within Functional Limits for tasks assessed                                          General Comments      Exercises Total Joint  Exercises Ankle Circles/Pumps: AROM, Both, 10 reps, Supine Quad Sets: AROM, Both, 5 reps, Supine Heel Slides: AAROM, Right, 5 reps, Supine Hip ABduction/ADduction: AAROM, Right, 5 reps, Supine   Assessment/Plan    PT Assessment Patient needs continued PT services  PT Problem List Decreased activity tolerance;Decreased mobility;Pain       PT Treatment Interventions Gait training;Stair training;DME instruction;Therapeutic activities;Therapeutic exercise;Patient/family education    PT Goals (Current goals can be found in the Care Plan section)  Acute Rehab PT Goals Patient Stated Goal: walk without pain PT Goal Formulation: With patient/family Time For Goal Achievement: 09/16/22 Potential to Achieve Goals: Good    Frequency 7X/week     Co-evaluation               AM-PAC PT "6 Clicks" Mobility  Outcome Measure Help needed turning from your back to your side while in a flat bed without using bedrails?: A Little Help needed moving from lying on your back to sitting on the side of a flat bed without using bedrails?: A Little Help needed moving to and from a bed to a chair (including a wheelchair)?: A Little Help needed standing up from a chair using your arms (e.g., wheelchair or bedside chair)?: A Little Help needed to walk in hospital room?: A Little Help needed climbing 3-5 steps with a railing? : A Little 6 Click Score: 18    End of Session Equipment Utilized During Treatment: Gait belt Activity Tolerance: Patient tolerated treatment well Patient left: in bed;with call bell/phone within reach;with family/visitor present;with bed alarm set Nurse Communication: Mobility status PT Visit Diagnosis: Difficulty in walking, not elsewhere classified (R26.2);Other abnormalities of gait and mobility (R26.89)    Time: 4132-4401 PT Time Calculation (min) (ACUTE ONLY): 26 min   Charges:   PT Evaluation $PT Eval Moderate Complexity: 1 Mod PT Treatments $Gait Training: 8-22  mins PT General Charges $$ ACUTE PT VISIT: 1 Visit        Tamala Ser PT 09/09/2022  Acute Rehabilitation Services  Office [phone]

## 2022-09-09 NOTE — TOC Initial Note (Signed)
Transition of Care [facility]) - Initial/Assessment Note   Patient Details  Name: Reginald Walsh MRN: [id number] Date of Birth: [birth year]  Transition of Care [facility] CM/SW Contact:    Ewing Schlein, LCSW Phone Number: 09/09/2022, 12:41 PM  Clinical Narrative: TOC following for possible discharge needs.               Expected Discharge Plan: Home/Self Care Barriers to Discharge: Continued Medical Work up  Expected Discharge Plan and Services In-house Referral: Clinical Social Work Living arrangements for the past 2 months: Single Family Home  Prior Living Arrangements/Services Living arrangements for the past 2 months: Single Family Home Patient language and need for interpreter reviewed:: Yes Need for Family Participation in Patient Care: No (Comment) Care giver support system in place?: Yes (comment) Criminal Activity/Legal Involvement Pertinent to Current Situation/Hospitalization: No - Comment as needed  Activities of Daily Living Home Assistive Devices/Equipment: Eyeglasses, Walker (specify type), Grab bars in shower, Cane (specify quad or straight) ADL Screening (condition at time of admission) Patient's cognitive ability adequate to safely complete daily activities?: Yes Is the patient deaf or have difficulty hearing?: No Does the patient have difficulty seeing, even when wearing glasses/contacts?: No Does the patient have difficulty concentrating, remembering, or making decisions?: No Patient able to express need for assistance with ADLs?: Yes Does the patient have difficulty dressing or bathing?: No Independently performs ADLs?: Yes (appropriate for developmental age) Does the patient have difficulty walking or climbing stairs?: No Weakness of Legs: None Weakness of Arms/Hands: None  Emotional Assessment Orientation: : Oriented to Self, Oriented to Place, Oriented to  Time, Oriented to Situation Psych Involvement: No (comment)  Admission diagnosis:  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) [T84.51XA] 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   Cellulitis of right lower limb 07/01/2021   Chronic sinusitis 07/01/2021   Complication of surgical procedure 07/01/2021   Depression 07/01/2021   Eczema 07/01/2021   Achilles tendinitis 07/01/2021   Flat foot 07/01/2021   History of alcohol abuse 07/01/2021   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   Other and unspecified noninfectious gastroenteritis and colitis 07/01/2021   Other osteonecrosis, 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HCC) 07/01/2021   Presbyopia 07/01/2021   Thoracic or lumbosacral neuritis or radiculitis 07/01/2021   Tobacco use disorder 07/01/2021   Vitamin D deficiency 07/01/2021   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   History of total left hip arthroplasty 07/01/2021   Hyponatremia 04/21/2021   Hypomagnesemia 04/21/2021   Hypokalemia 04/21/2021   Chronic post-traumatic stress disorder 04/21/2021   Chronic obstructive pulmonary disease, unspecified (HCC) 04/21/2021   Gastroesophageal reflux disease 04/21/2021   Major depressive disorder, recurrent, moderate (HCC) 04/21/2021   Hyperlipidemia 04/21/2021   Cervical spondylosis 12/06/2019   Essential hypertension 06/02/2019   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HCC) 06/02/2019   Cervical radiculopathy 10/21/2016   Chronic back pain 04/21/2016   PCP:  [name], NP Pharmacy:   [id number] - [place], [place] - [address] [place] [address] Phone: [phone] Fax: [phone]  [facility] -  [place], [place] -  [employer] [facility] [address] [place] [address] Phone: [phone] Fax: [phone]  Social Determinants of Health (SDOH) Social History: SDOH Screenings   Food Insecurity: No Food Insecurity (09/08/2022)  Housing: Low Risk  (09/08/2022)  Transportation Needs: No Transportation Needs (09/08/2022)  Utilities: Not At Risk (09/08/2022)  Depression (PHQ2-9): Medium Risk (07/01/2021)  Social Connections: Unknown (06/10/2021)   Received from [facility]  Tobacco Use: High Risk (09/08/2022)   SDOH Interventions:    Readmission Risk Interventions    07/30/2021   10:37 AM 07/09/2021    1:00 PM  Readmission Risk Prevention Plan  Post Dischage Appt  Complete  Medication Screening  Complete  Transportation Screening Complete Complete  PCP or Specialist Appt within 5-7 Days Complete   Home Care Screening Complete   Medication Review (RN CM) Complete

## 2022-09-09 NOTE — Consult Note (Addendum)
[facility]    Date of Admission:  09/08/2022     Reason for Consult: prosthetic joint infection    Referring Provider: Durene Romans     Abx: 8/14-c fluconazole 400 mg po once a day        Assessment: 57 yo male hx bilateral hip arthroplasty (left 2021, right 03/2021), complicated by delayed right hip infected seroma s/p 1 year of doxy/cefadroxil suppression, admitted 8/13 for concern of recurrent infection  Patient developed discomfort/blistering-red-induration along incision while on doxycycline (he stopped cefadroxil at advice of his ID doc 01/2022). Doxy was stopped and given 10 day of linezolid/levoflox. 09/07/22 ortho office aspirate of the fluid collection (not joint -- 80 cc fluid bloody/tinge of purulence) 1 day after linezolid/levoflox and the synovosure grew candida albican. He had received one dose of fluconazole 150 mg prior to 8/13 I&D -- the hip joint wasn't opened but the fluid collection was excised / debrided  8/13 operative cx in progress 8/12 office right hip joint aspirate synovosure -- candida albican     Plan: Discuss with him oral fluconazole 100% orally absorbed and has excellent penetration in virtually all tissues and will be an excellent medication for candida albican Discuss with him that culture could still grow something else or might not grow anything at all given prior abx Asked micro to hold culture for 2 weeks Start fluconazole 400 mg po once a day He is being referred to [facility] for consideration of hardware removal If hardware is ultimately removed, I would recommend treating for another 3-6 months beyond that, otherwise if it remains in I suggest to continue fluconazole for at least a year if not longer He'll follow up with his ID physician at the [facility] and I advised within 2-3 weeks to follow finalized culture and adjust/add abx as needed Ok to discharge from id standpoint While he is here will follow chart for  culture result Discussed with Dr Charlann Boxer      ------------------------------------------------ Principal Problem:   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 male 57 yo male hx bilateral hip arthroplasty (left 2021, right 03/2021), complicated by delayed right hip infected seroma s/p 1 year of doxy/cefadroxil suppression, admitted 8/13 for concern of recurrent infection  Patient developed discomfort/blistering-red-induration along incision while on doxycycline (he stopped cefadroxil at advice of his ID doc 01/2022). Doxy was stopped and given 10 day of linezolid/levoflox. 09/07/22 ortho office aspirate of the fluid collection (not joint -- 80 cc fluid bloody/tinge of purulence) 1 day after linezolid/levoflox and the synovosure grew candida albican. He had received one dose of fluconazole 150 mg prior to 8/13 I&D -- the hip joint wasn't opened but the fluid collection was excised / debrided  He reported doing well on doxycycline after 01/2022 when he was taken off cefadroxil. About 2 weeks ago he was mowing lawn doing yardwork and noticed swelling/discomfort around right lateral incision. He described it as "induration." Subsequently blisters over the incision developed and coalesced. No fever/chill.   [facility] who transitioned abx to linezolid/levoflox for 10 days.   Despite these abx his blistering process was not better  The aspirate on 8/12 in ortho grew candida albican (see ortho note) and he received 1 dose fluconazole 150 mg before this admission 8/13  I reviewed operative note   Afebrile Hds No leukocytosis No crp/sed rate No imaging  No other complaint  Family History  Adopted: Yes  Problem Relation Age of Onset   Lung cancer Father    Prostate cancer Father        Unsure of age of onset    Social History   Tobacco Use   Smoking status: Former    Current packs/day: 0.00    Average packs/day: 0.5 packs/day for  7.0 years (3.5 ttl pk-yrs)    Types: Cigarettes    Start date: 65    Quit date: 1989    Years since quitting: 35.6   Smokeless tobacco: Current    Types: Snuff, Chew  Vaping Use   Vaping status: Never Used  Substance Use Topics   Alcohol use: Yes    Alcohol/week: 1.0 standard drink of alcohol    Types: 1 Cans of beer per week    Comment: occasional   Drug use: Never    Allergies  Allergen Reactions   Pollen Extract Anaphylaxis    Combined with congestion   Poison Ivy Extract Hives   Iron Nausea And Vomiting   Tegaderm Alginate Ag Rope Other (See Comments)    Can use plain Tegaderm   Escitalopram Oxalate Anxiety    Review of Systems: ROS All Other ROS was negative, except mentioned above   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   40 mm by echo   Chest pain of uncertain etiology    Normal coronaries after abnormal Myoview Feb 2021 Echo March 2021 showed normal LVF- mild LVH    Chest pain of unknown etiology 02/2019   normal coronaries after abnormal Myoview   COPD (chronic obstructive pulmonary disease) (HCC)    Depression    Dyspnea    Encounter for screening for COVID-19 07/01/2021   Generalized enlarged lymph nodes 07/01/2021   GERD (gastroesophageal reflux disease)    Hypertension    normal RA dopplers   Hyponatremia    Other allergy status, other than to drugs and biological substances 07/01/2021   Other and unspecified complications of medical care, not elsewhere classified 07/01/2021   Other ill-defined and unknown causes of morbidity and mortality 07/01/2021   Overweight    Pain in left knee 07/01/2021   Pain in right foot 07/01/2021   PTSD (post-traumatic stress disorder)        Scheduled Meds:  aspirin  81 mg Oral BID   carvedilol  12.5 mg Oral BID   docusate sodium  100 mg Oral BID   DULoxetine  60 mg Oral Daily   fluconazole  400 mg  Oral Daily   fluticasone  2 spray Each Nare Daily   ipratropium-albuterol  3 mL Inhalation BID   montelukast  10 mg Oral Daily   pantoprazole  40 mg Oral Daily   polyethylene glycol  17 g Oral BID   rosuvastatin  20 mg Oral Daily   Continuous Infusions:  sodium chloride 75 mL/hr at 09/09/22 0618   methocarbamol (ROBAXIN) IV     PRN Meds:.acetaminophen, albuterol, bisacodyl, diphenhydrAMINE, HYDROcodone-acetaminophen, HYDROcodone-acetaminophen, HYDROmorphone (DILAUDID) injection, menthol-cetylpyridinium **OR** phenol, methocarbamol **OR** methocarbamol (ROBAXIN) IV, metoCLOPramide **OR** metoCLOPramide (REGLAN) injection, ondansetron **OR** ondansetron (ZOFRAN) IV   OBJECTIVE: Blood pressure 116/87, pulse 79, temperature 97.8 F (36.6 C), temperature source Oral, resp. rate 17, height 6\' 3"  (1.905 m), weight 104.3 kg, SpO2 98%.  Physical Exam  General/constitutional: no distress, pleasant  HEENT: Normocephalic, PER, Conj Clear, EOMI, Oropharynx clear Neck supple CV: rrr no mrg Lungs: clear to auscultation, normal respiratory effort Abd: Soft, Nontender Ext: no edema Skin: No Rash Neuro: nonfocal MSK: no peripheral joint swelling/tenderness/warmth; right lateral hip dressing clean/dry; the hemovac drains serosanguinous fluid     Lab Results Lab Results  Component Value Date   WBC 3.0 (L) 09/09/2022   HGB 9.8 (L) 09/09/2022   HCT 30.7 (L) 09/09/2022   MCV 88.0 09/09/2022   PLT 147 (L) 09/09/2022    Lab Results  Component Value Date   CREATININE 0.64 09/09/2022   BUN 9 09/09/2022   NA 128 (L) 09/09/2022   K 4.2 09/09/2022   CL 95 (L) 09/09/2022   CO2 23 09/09/2022    Lab Results  Component Value Date   ALT 19 08/14/2021   AST 22 08/14/2021   ALKPHOS 97 08/14/2021   BILITOT 0.3 08/14/2021      Microbiology: Recent Results (from the past 240 hour(s))  Aerobic/Anaerobic Culture w Gram Stain (surgical/deep wound)     Status: None (Preliminary result)    Collection Time: 09/08/22  2:00 PM   Specimen: Path fluid; Tissue  Result Value Ref Range Status   Specimen Description   Final    FLUID Performed at [facility], [address], [place], [place] [address]    Special Requests   Final    NONE Performed at [facility], [address] [place], [place] [address]    Gram Stain   Final    RARE WBC PRESENT,BOTH PMN AND MONONUCLEAR NO ORGANISMS SEEN    Culture   Final    NO GROWTH < 12 HOURS Performed at [facility], [address], [place], [place] [address]    Report Status PENDING  Incomplete     Serology:    Imaging: If present, new imagings (plain films, ct scans, and mri) have been personally visualized and interpreted; radiology reports have been reviewed. Decision making incorporated into the Impression / Recommendations.    Raymondo Band, MD [facility] [phone] pager    09/09/2022, 10:16 AM

## 2022-09-09 NOTE — Progress Notes (Signed)
Subjective: 1 Day Post-Op Procedure(s) (LRB): IRRIGATION AND DEBRIDEMENT HIP (Right) Patient reports pain as moderate.   Patient seen in rounds by Dr. Charlann Boxer. Patient is resting in bed on exam this morning. No acute events overnight. Foley catheter removed. Patient has not been up with PT yet.  We will start therapy today.   Objective: Vital signs in last 24 hours: Temp:  [97.5 F (36.4 C)-98.3 F (36.8 C)] 97.8 F (36.6 C) (08/14 0539) Pulse Rate:  [73-96] 79 (08/14 0539) Resp:  [12-19] 17 (08/14 0539) BP: (116-140)/(83-94) 116/87 (08/14 0539) SpO2:  [92 %-100 %] 97 % (08/14 0539) Weight:  [104.3 kg] 104.3 kg (08/13 1105)  Intake/Output from previous day:  Intake/Output Summary (Last 24 hours) at 09/09/2022 0821 Last data filed at 09/09/2022 0750 Gross per 24 hour  Intake 3908.75 ml  Output 3100 ml  Net 808.75 ml     Intake/Output this shift: Total I/O In: -  Out: 400 [Urine:400]  Labs: Recent Labs    09/07/22 1005 09/09/22 0346  HGB 12.7* 9.8*   Recent Labs    09/07/22 1005 09/09/22 0346  WBC 4.7 3.0*  RBC 4.41 3.49*  HCT 38.0* 30.7*  PLT 190 147*   Recent Labs    09/07/22 1005 09/09/22 0346  NA 132* 128*  K 4.5 4.2  CL 95* 95*  CO2 26 23  BUN 8 9  CREATININE 0.69 0.64  GLUCOSE 99 130*  CALCIUM 9.2 8.5*   No results for input(s): "LABPT", "INR" in the last 72 hours.  Exam: General - Patient is Alert and Oriented Extremity - Neurologically intact Sensation intact distally Intact pulses distally Dorsiflexion/Plantar flexion intact Dressing - dressing C/D/I. Hemovac in place. Motor Function - intact, moving foot and toes well on exam.   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   40 mm by echo   Chest pain of uncertain etiology    Normal coronaries after abnormal Myoview Feb 2021 Echo March 2021 showed normal LVF- mild LVH    Chest pain  of unknown etiology 02/2019   normal coronaries after abnormal Myoview   COPD (chronic obstructive pulmonary disease) (HCC)    Depression    Dyspnea    Encounter for screening for COVID-19 07/01/2021   Generalized enlarged lymph nodes 07/01/2021   GERD (gastroesophageal reflux disease)    Hypertension    normal RA dopplers   Hyponatremia    Other allergy status, other than to drugs and biological substances 07/01/2021   Other and unspecified complications of medical care, not elsewhere classified 07/01/2021   Other ill-defined and unknown causes of morbidity and mortality 07/01/2021   Overweight    Pain in left knee 07/01/2021   Pain in right foot 07/01/2021   PTSD (post-traumatic stress disorder)     Assessment/Plan: 1 Day Post-Op Procedure(s) (LRB): IRRIGATION AND DEBRIDEMENT HIP (Right) Principal Problem:   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   Weight as of this encounter: 104.3 kg. Advance diet Up with therapy  DVT Prophylaxis - Aspirin Weight bearing as tolerated.  Hgb stable at 9.8.  Will leave the drain in place until output is less than 20-30 cc/shift. Bulky dressing may be changed as needed when soiled.  Dr. Charlann Boxer to call for ID consult today. We obtained new intra-op cultures yesterday from the superficial space, and I also imported the synovasure results from clinic.  I have held off on PICC placement for now in the event that a PO medication is recommended.  Appreciate their assistance.   Up with PT today as able. Likely to be here for several more days.   Rosalene Billings, PA-C Orthopedic Surgery [phone] 09/09/2022, 8:21 AM

## 2022-09-09 NOTE — Plan of Care (Signed)
  Problem: Education: Goal: Knowledge of the prescribed therapeutic regimen will improve Outcome: Progressing Goal: Understanding of discharge needs will improve Outcome: Progressing Goal: Individualized Educational Video(s) Outcome: Progressing   Problem: Activity: Goal: Ability to avoid complications of mobility impairment will improve Outcome: Progressing Goal: Ability to tolerate increased activity will improve Outcome: Progressing   Problem: Pain Management: Goal: Pain level will decrease with appropriate interventions Outcome: Progressing   

## 2022-09-10 LAB — CBC
HCT: 25.7 % — ABNORMAL LOW (ref 39.0–52.0)
Hemoglobin: 8.4 g/dL — ABNORMAL LOW (ref 13.0–17.0)
MCH: 29.1 pg (ref 26.0–34.0)
MCHC: 32.7 g/dL (ref 30.0–36.0)
MCV: 88.9 fL (ref 80.0–100.0)
Platelets: 175 10*3/uL (ref 150–400)
RBC: 2.89 MIL/uL — ABNORMAL LOW (ref 4.22–5.81)
RDW: 15.8 % — ABNORMAL HIGH (ref 11.5–15.5)
WBC: 10.1 10*3/uL (ref 4.0–10.5)
nRBC: 0.4 % — ABNORMAL HIGH (ref 0.0–0.2)

## 2022-09-10 LAB — COMPREHENSIVE METABOLIC PANEL
ALT: 16 U/L (ref 0–44)
AST: 20 U/L (ref 15–41)
Albumin: 3 g/dL — ABNORMAL LOW (ref 3.5–5.0)
Alkaline Phosphatase: 58 U/L (ref 38–126)
Anion gap: 9 (ref 5–15)
BUN: 16 mg/dL (ref 6–20)
CO2: 27 mmol/L (ref 22–32)
Calcium: 8.3 mg/dL — ABNORMAL LOW (ref 8.9–10.3)
Chloride: 90 mmol/L — ABNORMAL LOW (ref 98–111)
Creatinine, Ser: 0.97 mg/dL (ref 0.61–1.24)
GFR, Estimated: >60 mL/min (ref >60)
Glucose, Bld: 102 mg/dL — ABNORMAL HIGH (ref 70–99)
Potassium: 3.8 mmol/L (ref 3.5–5.1)
Sodium: 126 mmol/L — ABNORMAL LOW (ref 135–145)
Total Bilirubin: 0.3 mg/dL (ref 0.3–1.2)
Total Protein: 5.6 g/dL — ABNORMAL LOW (ref 6.5–8.1)

## 2022-09-10 MED ORDER — KETOROLAC TROMETHAMINE 15 MG/ML IJ SOLN
7.5000 mg | Freq: Four times a day (QID) | INTRAMUSCULAR | Status: AC
  Administered 2022-09-10 – 2022-09-11 (×4): 7.5 mg via INTRAVENOUS
  Filled 2022-09-10 (×4): qty 1

## 2022-09-10 NOTE — Progress Notes (Signed)
Subjective: 2 Days Post-Op Procedure(s) (LRB): IRRIGATION AND DEBRIDEMENT HIP (Right) Patient reports pain as mild.   Patient seen in rounds for Dr. Charlann Boxer. Patient is resting in bed on exam this morning. No acute events overnight. He did have bleeding from his incision and they changed his bulky dressing yesterday. He is concerned about his hemoglobin based on his experience with the last surgery. We will continue therapy today.   Objective: Vital signs in last 24 hours: Temp:  [97.3 F (36.3 C)-98 F (36.7 C)] 97.6 F (36.4 C) (08/15 0526) Pulse Rate:  [77-92] 88 (08/15 0526) Resp:  [16-18] 16 (08/15 0526) BP: (110-125)/(79-90) 124/80 (08/15 0526) SpO2:  [91 %-98 %] 95 % (08/15 0958)  Intake/Output from previous day:  Intake/Output Summary (Last 24 hours) at 09/10/2022 1106 Last data filed at 09/10/2022 0900 Gross per 24 hour  Intake 1920 ml  Output 2915 ml  Net -995 ml     Intake/Output this shift: Total I/O In: -  Out: 750 [Urine:750]  Labs: Recent Labs    09/09/22 0346 09/10/22 0329  HGB 9.8* 8.4*   Recent Labs    09/09/22 0346 09/10/22 0329  WBC 3.0* 10.1  RBC 3.49* 2.89*  HCT 30.7* 25.7*  PLT 147* 175   Recent Labs    09/09/22 0346  NA 128*  K 4.2  CL 95*  CO2 23  BUN 9  CREATININE 0.64  GLUCOSE 130*  CALCIUM 8.5*   No results for input(s): "LABPT", "INR" in the last 72 hours.  Exam: General - Patient is Alert and Oriented Extremity - Neurologically intact Sensation intact distally Intact pulses distally Dorsiflexion/Plantar flexion intact Dressing - dressing C/D/I Motor Function - intact, moving foot and toes well on exam.   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   40 mm by echo   Chest pain of uncertain etiology    Normal coronaries after abnormal Myoview Feb 2021 Echo March 2021 showed normal LVF- mild LVH    Chest pain of  unknown etiology 02/2019   normal coronaries after abnormal Myoview   COPD (chronic obstructive pulmonary disease) (HCC)    Depression    Dyspnea    Encounter for screening for COVID-19 07/01/2021   Generalized enlarged lymph nodes 07/01/2021   GERD (gastroesophageal reflux disease)    Hypertension    normal RA dopplers   Hyponatremia    Other allergy status, other than to drugs and biological substances 07/01/2021   Other and unspecified complications of medical care, not elsewhere classified 07/01/2021   Other ill-defined and unknown causes of morbidity and mortality 07/01/2021   Overweight    Pain in left knee 07/01/2021   Pain in right foot 07/01/2021   PTSD (post-traumatic stress disorder)     Assessment/Plan: 2 Days Post-Op Procedure(s) (LRB): IRRIGATION AND DEBRIDEMENT HIP (Right) Principal Problem:   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   Weight as of this encounter: 104.3 kg. Advance diet Up with therapy  DVT Prophylaxis - Aspirin Weight bearing as tolerated.  ABLA: Hgb stable at 8.4 currently, but has dropped from 12.7 pre-operatively. We will recheck his CBC tomorrow.   Up with PT today as able.  I did redress his hip  today with mepilex dressings for comfort.  Hemovac drain left in place for now based on output of 115 yesterday.  Rosalene Billings, PA-C Orthopedic Surgery [phone] 09/10/2022, 11:06 AM

## 2022-09-10 NOTE — Progress Notes (Signed)
PT Cancellation Note  Patient Details Name: Reginald Walsh MRN: [id number] DOB: [birth year]   Cancelled Treatment:    Reason Eval/Treat Not Completed: Fatigue/lethargy limiting ability to participate (pt stated he's feeling "washed out" this afternoon, he attributes this to low Hgb. He politely declined PT and requested PT hold off until tomorrow. Pt is performeding R hip ROM exercises independently and was encourged to continue those.)  Tamala Ser PT 09/10/2022  Acute Rehabilitation Services  Office [phone]

## 2022-09-10 NOTE — Progress Notes (Signed)
Physical Therapy Treatment Patient Details Name: Reginald Walsh MRN: [id number] DOB: 22-Feb-1965 Today's Date: 09/10/2022   History of Present Illness 57 y.o. male admitted 09/08/22 for R hip I&D. PMH: R THA 03/2021, R hip I&D June 2023 and July 2023, COPD, HTN, PTSD. Pt stated plan is for R THA to be resected at [facility] in the future, date is TBD.    PT Comments  Pt is progressing well with mobility, he ambulated 180' with RW, no loss of balance. Pt performed ROM exercises for R hip and tolerated them well.      If plan is discharge home, recommend the following: A little help with bathing/dressing/bathroom;Assistance with cooking/housework;Assist for transportation;Help with stairs or ramp for entrance   Can travel by private vehicle        Equipment Recommendations  None recommended by PT    Recommendations for Other Services       Precautions / Restrictions Precautions Precautions: Fall Restrictions Weight Bearing Restrictions: No RLE Weight Bearing: Weight bearing as tolerated     Mobility  Bed Mobility Overal bed mobility: Modified Independent             General bed mobility comments: HOB up, used rail    Transfers Overall transfer level: Needs assistance Equipment used: Rolling walker (2 wheels) Transfers: Sit to/from Stand Sit to Stand: Supervision           General transfer comment: VCs hand placement    Ambulation/Gait Ambulation/Gait assistance: Supervision Gait Distance (Feet): 180 Feet Assistive device: Rolling walker (2 wheels) Gait Pattern/deviations: Step-through pattern, Decreased stride length Gait velocity: decr     General Gait Details: steady, no loss of balance   Stairs             Wheelchair Mobility     Tilt Bed    Modified Rankin (Stroke Patients Only)       Balance Overall balance assessment: Modified Independent                                          Cognition Arousal: Alert Behavior  During Therapy: WFL for tasks assessed/performed Overall Cognitive Status: Within Functional Limits for tasks assessed                                          Exercises Total Joint Exercises Ankle Circles/Pumps: AROM, Both, 10 reps, Supine Short Arc Quad: AROM, Right, 10 reps, Supine Heel Slides: AAROM, Right, Supine, 10 reps Hip ABduction/ADduction: AAROM, Right, Supine, 10 reps    General Comments        Pertinent Vitals/Pain Pain Assessment Pain Score: 6  Pain Location: R hip Pain Descriptors / Indicators: Sore Pain Intervention(s): Limited activity within patient's tolerance, Monitored during session, Premedicated before session, Ice applied    Home Living                          Prior Function            PT Goals (current goals can now be found in the care plan section) Acute Rehab PT Goals Patient Stated Goal: walk without pain PT Goal Formulation: With patient/family Time For Goal Achievement: 09/16/22 Potential to Achieve Goals: Good Progress towards PT goals: Progressing toward goals  Frequency    7X/week      PT Plan      Co-evaluation              AM-PAC PT "6 Clicks" Mobility   Outcome Measure  Help needed turning from your back to your side while in a flat bed without using bedrails?: A Little Help needed moving from lying on your back to sitting on the side of a flat bed without using bedrails?: A Little Help needed moving to and from a bed to a chair (including a wheelchair)?: A Little Help needed standing up from a chair using your arms (e.g., wheelchair or bedside chair)?: A Little Help needed to walk in hospital room?: A Little Help needed climbing 3-5 steps with a railing? : A Little 6 Click Score: 18    End of Session Equipment Utilized During Treatment: Gait belt Activity Tolerance: Patient tolerated treatment well Patient left: in bed;with call bell/phone within reach;with bed alarm set;with  nursing/sitter in room Nurse Communication: Mobility status PT Visit Diagnosis: Difficulty in walking, not elsewhere classified (R26.2);Other abnormalities of gait and mobility (R26.89)     Time: 2536-6440 PT Time Calculation (min) (ACUTE ONLY): 20 min  Charges:    $Gait Training: 8-22 mins PT General Charges $$ ACUTE PT VISIT: 1 Visit                     Tamala Ser PT 09/10/2022  Acute Rehabilitation Services  Office [phone]

## 2022-09-11 ENCOUNTER — Telehealth (HOSPITAL_BASED_OUTPATIENT_CLINIC_OR_DEPARTMENT_OTHER): Payer: Self-pay | Admitting: Family

## 2022-09-11 DIAGNOSIS — E871 Hypo-osmolality and hyponatremia: Secondary | ICD-10-CM | POA: Diagnosis not present

## 2022-09-11 LAB — BASIC METABOLIC PANEL
Anion gap: 9 (ref 5–15)
Anion gap: 9 (ref 5–15)
BUN: 15 mg/dL (ref 6–20)
BUN: 14 mg/dL (ref 6–20)
CO2: 25 mmol/L (ref 22–32)
CO2: 26 mmol/L (ref 22–32)
Calcium: 8.3 mg/dL — ABNORMAL LOW (ref 8.9–10.3)
Calcium: 8.4 mg/dL — ABNORMAL LOW (ref 8.9–10.3)
Chloride: 91 mmol/L — ABNORMAL LOW (ref 98–111)
Chloride: 92 mmol/L — ABNORMAL LOW (ref 98–111)
Creatinine, Ser: 0.94 mg/dL (ref 0.61–1.24)
Creatinine, Ser: 0.93 mg/dL (ref 0.61–1.24)
GFR, Estimated: >60 mL/min (ref >60)
GFR, Estimated: >60 mL/min (ref >60)
Glucose, Bld: 94 mg/dL (ref 70–99)
Glucose, Bld: 84 mg/dL (ref 70–99)
Potassium: 3.3 mmol/L — ABNORMAL LOW (ref 3.5–5.1)
Potassium: 3.8 mmol/L (ref 3.5–5.1)
Sodium: 125 mmol/L — ABNORMAL LOW (ref 135–145)
Sodium: 127 mmol/L — ABNORMAL LOW (ref 135–145)

## 2022-09-11 LAB — CBC
HCT: 18.9 % — ABNORMAL LOW (ref 39.0–52.0)
Hemoglobin: 6.2 g/dL — CL (ref 13.0–17.0)
MCH: 28.4 pg (ref 26.0–34.0)
MCHC: 32.8 g/dL (ref 30.0–36.0)
MCV: 86.7 fL (ref 80.0–100.0)
Platelets: 157 10*3/uL (ref 150–400)
RBC: 2.18 MIL/uL — ABNORMAL LOW (ref 4.22–5.81)
RDW: 15.7 % — ABNORMAL HIGH (ref 11.5–15.5)
WBC: 11.2 10*3/uL — ABNORMAL HIGH (ref 4.0–10.5)
nRBC: 0.4 % — ABNORMAL HIGH (ref 0.0–0.2)

## 2022-09-11 LAB — PROTIME-INR
INR: 1 (ref 0.8–1.2)
Prothrombin Time: 13.2 seconds (ref 11.4–15.2)

## 2022-09-11 LAB — PREPARE RBC (CROSSMATCH)

## 2022-09-11 MED ORDER — TRANEXAMIC ACID 650 MG PO TABS
1950.0000 mg | ORAL_TABLET | Freq: Every day | ORAL | Status: AC
  Administered 2022-09-11 – 2022-09-13 (×3): 1950 mg via ORAL
  Filled 2022-09-11 (×3): qty 3

## 2022-09-11 MED ORDER — SODIUM CHLORIDE 0.9 % IV BOLUS: 500.0000 mL | Freq: Once | INTRAVENOUS | Status: DC

## 2022-09-11 MED ORDER — SODIUM CHLORIDE 0.9% IV SOLUTION: Freq: Once | INTRAVENOUS | Status: AC

## 2022-09-11 MED ORDER — POTASSIUM CHLORIDE CRYS ER 20 MEQ PO TBCR: 20.0000 meq | EXTENDED_RELEASE_TABLET | Freq: Once | ORAL | Status: DC

## 2022-09-11 NOTE — Plan of Care (Signed)
  Problem: Activity: Goal: Ability to avoid complications of mobility impairment will improve Outcome: Progressing Goal: Ability to tolerate increased activity will improve Outcome: Progressing   Problem: Skin Integrity: Goal: Will show signs of wound healing Outcome: Progressing   Problem: Safety: Goal: Ability to remain free from injury will improve Outcome: Progressing

## 2022-09-11 NOTE — Progress Notes (Signed)
Subjective: 3 Days Post-Op Procedure(s) (LRB): IRRIGATION AND DEBRIDEMENT HIP (Right) Patient reports pain as mild.   Patient seen in rounds for Dr. Charlann Boxer. Patient is resting in bed on exam this morning.  Hgb dropped to 6.2 this AM. 2 units of blood running already   We discussed this and his overall plan this morning. We will c0ntinue therapy today.   Objective: Vital signs in last 24 hours: Temp:  [97.8 F (36.6 C)-98.2 F (36.8 C)] 97.8 F (36.6 C) (08/16 0528) Pulse Rate:  [81-96] 81 (08/16 0528) Resp:  [16-18] 18 (08/16 0528) BP: (112-132)/(69-93) 114/75 (08/16 0528) SpO2:  [92 %-98 %] 96 % (08/16 0528)  Intake/Output from previous day:  Intake/Output Summary (Last 24 hours) at 09/11/2022 0813 Last data filed at 09/11/2022 0600 Gross per 24 hour  Intake 828.52 ml  Output 1800 ml  Net -971.48 ml     Intake/Output this shift: No intake/output data recorded.  Labs: Recent Labs    09/09/22 0346 09/10/22 0329 09/11/22 0327  HGB 9.8* 8.4* 6.2*   Recent Labs    09/10/22 0329 09/11/22 0327  WBC 10.1 11.2*  RBC 2.89* 2.18*  HCT 25.7* 18.9*  PLT 175 157   Recent Labs    09/09/22 0346 09/10/22 1712  NA 128* 126*  K 4.2 3.8  CL 95* 90*  CO2 23 27  BUN 9 16  CREATININE 0.64 0.97  GLUCOSE 130* 102*  CALCIUM 8.5* 8.3*   No results for input(s): "LABPT", "INR" in the last 72 hours.  Exam: General - Patient is Alert and Oriented Extremity - Neurologically intact Sensation intact distally Intact pulses distally Dorsiflexion/Plantar flexion intact Dressing - dressing C/D/I Motor Function - intact, moving foot and toes well on exam.   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   40 mm by echo   Chest pain of uncertain etiology    Normal coronaries after abnormal Myoview Feb 2021 Echo March 2021 showed normal LVF- mild LVH    Chest pain of unknown  etiology 02/2019   normal coronaries after abnormal Myoview   COPD (chronic obstructive pulmonary disease) (HCC)    Depression    Dyspnea    Encounter for screening for COVID-19 07/01/2021   Generalized enlarged lymph nodes 07/01/2021   GERD (gastroesophageal reflux disease)    Hypertension    normal RA dopplers   Hyponatremia    Other allergy status, other than to drugs and biological substances 07/01/2021   Other and unspecified complications of medical care, not elsewhere classified 07/01/2021   Other ill-defined and unknown causes of morbidity and mortality 07/01/2021   Overweight    Pain in left knee 07/01/2021   Pain in right foot 07/01/2021   PTSD (post-traumatic stress disorder)     Assessment/Plan: 3 Days Post-Op Procedure(s) (LRB): IRRIGATION AND DEBRIDEMENT HIP (Right) Principal Problem:   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   Weight as of this encounter: 104.3 kg. Advance diet Up with therapy  DVT Prophylaxis - Aspirin Weight bearing as tolerated.  Hemovac drain pulled today, minimal output No active bleeding from his incision - dressings dry from yesterday No bleeding from drain site when pulled  Cultures show no bacterial growth +fungus,  pending cultures  ABLA: Hgb 6.2 this AM >> will give 2 units PRBC  Patient with hyponatremia, 126 - likely related to volume loss and third spacing of fluid  Will obtain hospitalist consult today   I did place an order for a normal saline bolus and 20 meq Kdur for now  Likely here through the weekend   Rosalene Billings, PA-C Orthopedic Surgery [phone] 09/11/2022, 8:13 AM

## 2022-09-11 NOTE — Plan of Care (Signed)

## 2022-09-11 NOTE — Telephone Encounter (Signed)
Left message for patient to call and discuss rescheduling the Echocardiofram appointment that was missed on 08/31/22

## 2022-09-11 NOTE — Consult Note (Signed)
[facility] Initial Consultation Note  [name] ZOX:[id number] DOB: 12-Oct-1965 DOA: 09/08/2022  PCP: [name], NP   Requesting Physician: Rosalene Billings, PA for Dr. Charlann Boxer  Reason for Consultation: Anemia, hyponatremia  HPI: Reginald Walsh is a 57 y.o. male with medical history significant for depression, COPD on room air, GERD, bilateral hip replacement with Dr. Charlann Boxer (left 2021, right March 2023) complicated by history of right hip infected seroma requiring 1 year of suppressive antibiotics, admitted by the orthopedic surgery service on 8/13 due to concern for recurrent infection underwent washout on that day followed by ID who has started him on oral fluconazole and now hospitalist service has been consulted due to blood loss anemia and hyponatremia.  Patient states he has overall been feeling well, maybe a little bit on the tired side, but eating well, denies any confusion, lethargy, chest pain, fevers, chills.  He had a drain in the right hip after the superficial fluid collection excision and debridement (notes state that hip joint was not opened) this was putting out some bloody material per the patient, controlled with about 115 cc.  This stopped working overnight, and was removed this morning.  Operative cultures from 8/13 positive fungal culture with sensitivities pending.  He is being referred to [facility] for definitive treatment consideration of hardware removal.  Says that he has some tracking ecchymosis and tenderness in the right groin which is a little bit worse than yesterday.  This morning, he was noted to have continued worsening anemia, and gradually worsening sodium level.  This morning sodium level is 126, hemoglobin down to 6.8.  He has been started on IV normal saline infusion, and has been ordered 2 units of blood transfusion which are ongoing.  Patient states that for the last 3 to 4 days, he has been feeling thirsty and drinking a lot of water.  Review of  Systems: Please see HPI for pertinent positives and negatives. A complete 10 system review of systems are otherwise negative.  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   40 mm by echo   Chest pain of uncertain etiology    Normal coronaries after abnormal Myoview Feb 2021 Echo March 2021 showed normal LVF- mild LVH    Chest pain of unknown etiology 02/2019   normal coronaries after abnormal Myoview   COPD (chronic obstructive pulmonary disease) (HCC)    Depression    Dyspnea    Encounter for screening for COVID-19 07/01/2021   Generalized enlarged lymph nodes 07/01/2021   GERD (gastroesophageal reflux disease)    Hypertension    normal RA dopplers   Hyponatremia    Other allergy status, other than to drugs and biological substances 07/01/2021   Other and unspecified complications of medical care, not elsewhere classified 07/01/2021   Other ill-defined and unknown causes of morbidity and mortality 07/01/2021   Overweight    Pain in left knee 07/01/2021   Pain in right foot 07/01/2021   PTSD (post-traumatic stress disorder)    Past Surgical History:  Procedure Laterality Date   HEMATOMA EVACUATION Right 07/27/2021   Procedure: EVACUATION HEMATOMA;  Surgeon: Durene Romans, MD;  Location: [facility];  Service: Orthopedics;  Laterality: Right;   HIP ARTHROPLASTY Left 2021   HIP ARTHROPLASTY Right 2023   INCISION AND DRAINAGE Right 07/27/2021   Procedure: INCISIONAL AND NON-INCISIONAL WOUND DEBRIDEMENT  AND PRIMARY WOUND CLOSURE;  Surgeon: Durene Romans, MD;  Location: [facility];  Service: Orthopedics;  Laterality: Right;   INCISION AND DRAINAGE HIP Right 07/08/2021   Procedure: EXCISIONAL AND NON EXCISIONAL DEBRIDEMENT HIP;  Surgeon: Durene Romans, MD;  Location: [facility];  Service: Orthopedics;  Laterality: Right;   INCISION AND DRAINAGE HIP Right 09/08/2022   Procedure: IRRIGATION AND DEBRIDEMENT  HIP;  Surgeon: Durene Romans, MD;  Location: [facility];  Service: Orthopedics;  Laterality: Right;   KNEE ARTHROSCOPY Left 2008   LEFT HEART CATH AND CORONARY ANGIOGRAPHY N/A 03/21/2019   Procedure: LEFT HEART CATH AND CORONARY ANGIOGRAPHY;  Surgeon: Kathleene Hazel, MD;  Location: [facility];  Service: Cardiovascular;  Laterality: N/A;   LUMBAR LAMINECTOMY  2000   TENDON RELEASE Right 2018   foot   WISDOM TOOTH EXTRACTION     age 42    Social History:  reports that he quit smoking about 35 years ago. His smoking use included cigarettes. He started smoking about 42 years ago. He has a 3.5 pack-year smoking history. His smokeless tobacco use includes snuff and chew. He reports current alcohol use of about 1.0 standard drink of alcohol per week. He reports that he does not use drugs.  Allergies  Allergen Reactions   Pollen Extract Anaphylaxis    Combined with congestion   Poison Ivy Extract Hives   Iron Nausea And Vomiting   Tegaderm Alginate Ag Rope Other (See Comments)    Can use plain Tegaderm   Escitalopram Oxalate Anxiety    Family History  Adopted: Yes  Problem Relation Age of Onset   Lung cancer Father    Prostate cancer Father        Unsure of age of onset     Prior to Admission medications   Medication Sig Start Date End Date Taking? Authorizing Provider  albuterol (VENTOLIN HFA) 108 (90 Base) MCG/ACT inhaler Inhale 2 puffs into the lungs every 6 (six) hours as needed for wheezing or shortness of breath.   Yes [provider]  augmented betamethasone dipropionate (DIPROLENE-AF) 0.05 % cream Apply 1 application topically 2 (two) times daily as needed (eczema).   Yes [provider]  carvedilol (COREG) 25 MG tablet Take 0.5 tablets (12.5 mg total) by mouth 2 (two) times daily. 07/29/22 07/24/23 Yes Alver Sorrow, NP  DULoxetine (CYMBALTA) 60 MG capsule Take 60 mg by mouth daily.   Yes [provider]  fluticasone (FLONASE) 50 MCG/ACT  nasal spray Place 2 sprays into both nostrils in the morning and at bedtime. 07/01/21  Yes [name], NP  HYDROcodone-acetaminophen (NORCO/VICODIN) 5-325 MG tablet Take 1 tablet by mouth every 6 (six) hours as needed for moderate pain or severe pain.   Yes [provider]  hydrocortisone 2.5 % cream Apply topically 2 (two) times daily. Patient taking differently: Apply 1 Application topically daily as needed (itching). 08/18/21  Yes Hudnell, Judeth Cornfield, NP  Ipratropium-Albuterol (COMBIVENT) 20-100 MCG/ACT AERS respimat Inhale 1 puff into the lungs in the morning and at bedtime.   Yes [provider]  levofloxacin (LEVAQUIN) 750 MG tablet Take 750 mg by mouth daily. Patient not taking: Reported on 09/07/2022 08/27/22  Yes [provider]  linezolid (ZYVOX) 600 MG tablet Take 600 mg by mouth 2 (two) times daily. Patient not taking: Reported on 09/07/2022 08/27/22  Yes [provider]  montelukast (SINGULAIR) 10 MG tablet TAKE 1 TABLET BY MOUTH EVERY DAY 02/18/22  Yes [name], NP  Multiple Vitamins-Minerals (MENS MULTIVITAMIN PLUS PO) Take 1 tablet by mouth daily.    Yes [provider]  pantoprazole (PROTONIX) 40 MG tablet Take 40 mg by mouth daily.   Yes [provider]  REFRESH TEARS 0.5 % SOLN Place 1 drop into both eyes 3 (three) times daily as needed (dry eyes). 07/16/21  Yes [provider]  rosuvastatin (CRESTOR) 20 MG tablet TAKE 1 TABLET BY MOUTH EVERY DAY 01/21/22  Yes Alver Sorrow, NP  Vitamin D, Ergocalciferol, (DRISDOL) 1.25 MG (50000 UNIT) CAPS capsule Take 50,000 Units by mouth once a week. 08/18/22  Yes [provider]  influenza vac split quadrivalent PF (FLUARIX) 0.5 ML injection Inject into the muscle. 10/23/21   Judyann Munson, MD  sertraline (ZOLOFT) 50 MG tablet Take 100 mg by mouth daily.   02/14/20  [provider]  topiramate (TOPAMAX) 25 MG tablet Take 1 tablet (25 mg total) by mouth 2  (two) times daily. Start one at night for 4 days and then start taking one twice a day 01/18/20 02/14/20  Thresa Ross, MD    Physical Exam: BP 114/75 (BP Location: Right Arm)   Pulse 81   Temp 97.8 F (36.6 C) (Oral)   Resp 18   Ht 6\' 3"  (1.905 m)   Wt 104.3 kg   SpO2 97%   BMI 28.75 kg/m   General:  Alert, oriented, calm, in no acute distress, pleasant and cooperative, looks quite comfortable Eyes: EOMI, clear conjuctivae, white sclerea Neck: supple, no masses, trachea mildline  Cardiovascular: RRR, no murmurs or rubs, no peripheral edema  Respiratory: clear to auscultation bilaterally, no wheezes, no crackles  Abdomen: soft, nontender, nondistended, normal bowel tones heard  Skin: dry, no rashes  Musculoskeletal: no joint effusions, normal range of motion, right hip with dressed lateral incision, no significant erythema, or areas of induration.  The right upper thigh/groin area is grossly edematous, with mild ecchymosis tracking to the groin.  No obvious areas of fluctuance, no drainage from incision. Psychiatric: appropriate affect, normal speech  Neurologic: extraocular muscles intact, clear speech, moving all extremities with intact sensorium          Recent Labs and Imaging Reviewed:  Basic Metabolic Panel: Recent Labs  Lab 09/07/22 1005 09/09/22 0346 09/10/22 1712  NA 132* 128* 126*  K 4.5 4.2 3.8  CL 95* 95* 90*  CO2 26 23 27   GLUCOSE 99 130* 102*  BUN 8 9 16   CREATININE 0.69 0.64 0.97  CALCIUM 9.2 8.5* 8.3*   Liver Function Tests: Recent Labs  Lab 09/10/22 1712  AST 20  ALT 16  ALKPHOS 58  BILITOT 0.3  PROT 5.6*  ALBUMIN 3.0*   No results for input(s): "LIPASE", "AMYLASE" in the last 168 hours. No results for input(s): "AMMONIA" in the last 168 hours. CBC: Recent Labs  Lab 09/07/22 1005 09/09/22 0346 09/10/22 0329 09/11/22 0327  WBC 4.7 3.0* 10.1 11.2*  HGB 12.7* 9.8* 8.4* 6.2*  HCT 38.0* 30.7* 25.7* 18.9*  MCV 86.2 88.0 88.9 86.7  PLT 190  147* 175 157   Cardiac Enzymes: No results for input(s): "CKTOTAL", "CKMB", "CKMBINDEX", "TROPONINI" in the last 168 hours.  BNP (last 3 results) No results for input(s): "BNP" in the last 8760 hours.  ProBNP (last 3 results) No results for input(s): "PROBNP" in the last 8760 hours.  CBG: No results for input(s): "GLUCAP" in the last 168 hours.  Radiological Exams on Admission: No results found.  Summary and Recommendations: This  is a pleasant 57 year old gentleman with a history of depression, GERD, COPD on room air, bilateral hip arthroplasty admitted by the orthopedic service on 8/13 with concern for recurrent infection of the right hip arthroplasty site.  He underwent I&D with washout of superficial fluid collection with Dr. Charlann Boxer on 8/13, with cultures returning positive for fungal infection.  He has been placed on oral fluconazole by Dr. Renold Don of ID.  TRH was consulted this morning due to concerns of blood loss anemia, hyponatremia.  Acute blood loss anemia-patient hemodynamically stable, likely this is postoperative bleeding in the right hip.  No evidence of other bleeding, no tachycardia, hypotension or other concerning findings.  Patient denies any vomiting, hematemesis, melena or hematochezia.  Per my discussion with Morrie Sheldon PA, the degree of drain output and ecchymosis is not particularly concerning and in keeping with his recent surgery. -Agree with transfusion of 2 unit PRBC -Follow-up posttransfusion hemoglobin, and again with morning labs -Reasonable to continue postoperative aspirin prophylaxis for now -Add on INR  Hyponatremia-sodium levels has been gradually declining since admission, patient is asymptomatic without any confusion, headaches, significant lethargy.  Overall he does not look clinically a little dehydrated, could also have been exacerbated by his increased p.o. water intake in the last 24 to 48 hours per patient.  He is also taking Cymbalta which commonly can cause  hyponatremia. -Continue normal saline infusion -Fluid restriction 1800 cc/day -Follow-up hemoglobin every 8 hours -Consider holding Cymbalta if sodium not improving with the above  Other chronic medical issues and orthopedic issues are stable being managed by the primary team.  Thank you for involving [place] in the care of your patient.  [facility] will continue to follow along with you.    Code Status: Full Code  Time spent: 55 minutes  Dajanique Robley Sharlette Dense MD [facility] Pager [phone]  If 7PM-7AM, please contact night-coverage [email or web address] Password [email or web address]  09/11/2022, 9:05 AM

## 2022-09-11 NOTE — Progress Notes (Signed)
PT Cancellation Note  Patient Details Name: Reginald Walsh MRN: [id number] DOB: 1965-11-15   Cancelled Treatment:    Reason Eval/Treat Not Completed: Medical issues which prohibited therapy (Hgb 6.2 this morning, transfusion in process. Will follow.)   Tamala Ser PT 09/11/2022  Acute Rehabilitation Services  Office [phone]

## 2022-09-12 DIAGNOSIS — I10 Essential (primary) hypertension: Secondary | ICD-10-CM | POA: Diagnosis not present

## 2022-09-12 DIAGNOSIS — T8451XD Infection and inflammatory reaction due to internal right hip prosthesis, subsequent encounter: Secondary | ICD-10-CM

## 2022-09-12 DIAGNOSIS — D649 Anemia, unspecified: Secondary | ICD-10-CM

## 2022-09-12 DIAGNOSIS — B49 Unspecified mycosis: Secondary | ICD-10-CM

## 2022-09-12 DIAGNOSIS — T8451XA Infection and inflammatory reaction due to internal right hip prosthesis, initial encounter: Principal | ICD-10-CM

## 2022-09-12 LAB — VITAMIN B12: Vitamin B-12: 197 pg/mL (ref 180–914)

## 2022-09-12 LAB — RETICULOCYTES
Immature Retic Fract: 40.9 % — ABNORMAL HIGH (ref 2.3–15.9)
RBC.: 2.51 MIL/uL — ABNORMAL LOW (ref 4.22–5.81)
Retic Count, Absolute: 13.3 10*3/uL — ABNORMAL LOW (ref 19.0–186.0)
Retic Ct Pct: 0.5 % (ref 0.4–3.1)

## 2022-09-12 LAB — TSH: TSH: 1.658 u[IU]/mL (ref 0.350–4.500)

## 2022-09-12 LAB — HEMOGLOBIN AND HEMATOCRIT, BLOOD
HCT: 23.1 % — ABNORMAL LOW (ref 39.0–52.0)
Hemoglobin: 7.7 g/dL — ABNORMAL LOW (ref 13.0–17.0)

## 2022-09-12 LAB — IRON AND TIBC
Iron: 27 ug/dL — ABNORMAL LOW (ref 45–182)
Saturation Ratios: 8 % — ABNORMAL LOW (ref 17.9–39.5)
TIBC: 360 ug/dL (ref 250–450)
UIBC: 333 ug/dL

## 2022-09-12 LAB — BASIC METABOLIC PANEL
Anion gap: 8 (ref 5–15)
BUN: 12 mg/dL (ref 6–20)
CO2: 27 mmol/L (ref 22–32)
Calcium: 8.3 mg/dL — ABNORMAL LOW (ref 8.9–10.3)
Chloride: 96 mmol/L — ABNORMAL LOW (ref 98–111)
Creatinine, Ser: 1 mg/dL (ref 0.61–1.24)
GFR, Estimated: >60 mL/min (ref >60)
Glucose, Bld: 102 mg/dL — ABNORMAL HIGH (ref 70–99)
Potassium: 3.9 mmol/L (ref 3.5–5.1)
Sodium: 131 mmol/L — ABNORMAL LOW (ref 135–145)

## 2022-09-12 LAB — CBC
HCT: 22.3 % — ABNORMAL LOW (ref 39.0–52.0)
Hemoglobin: 7.5 g/dL — ABNORMAL LOW (ref 13.0–17.0)
MCH: 29.2 pg (ref 26.0–34.0)
MCHC: 33.6 g/dL (ref 30.0–36.0)
MCV: 86.8 fL (ref 80.0–100.0)
Platelets: 146 10*3/uL — ABNORMAL LOW (ref 150–400)
RBC: 2.57 MIL/uL — ABNORMAL LOW (ref 4.22–5.81)
RDW: 15 % (ref 11.5–15.5)
WBC: 8.7 10*3/uL (ref 4.0–10.5)
nRBC: 0.7 % — ABNORMAL HIGH (ref 0.0–0.2)

## 2022-09-12 LAB — LACTATE DEHYDROGENASE: LDH: 135 U/L (ref 98–192)

## 2022-09-12 LAB — SODIUM, URINE, RANDOM: Sodium, Ur: 19 mmol/L

## 2022-09-12 LAB — FOLATE: Folate: 7.7 ng/mL (ref >5.9)

## 2022-09-12 LAB — FERRITIN: Ferritin: 92 ng/mL (ref 24–336)

## 2022-09-12 LAB — OSMOLALITY: Osmolality: 266 mOsm/kg — ABNORMAL LOW (ref 275–295)

## 2022-09-12 LAB — OSMOLALITY, URINE: Osmolality, Ur: 134 mOsm/kg — ABNORMAL LOW (ref 300–900)

## 2022-09-12 MED ORDER — SODIUM CHLORIDE 0.9 % IV SOLN
510.0000 mg | Freq: Once | INTRAVENOUS | Status: AC
  Administered 2022-09-12: 510 mg via INTRAVENOUS
  Filled 2022-09-12: qty 17

## 2022-09-12 MED ORDER — FOLIC ACID 1 MG PO TABS
1.0000 mg | ORAL_TABLET | Freq: Every day | ORAL | Status: DC
  Administered 2022-09-12 – 2022-09-14 (×3): 1 mg via ORAL
  Filled 2022-09-12 (×3): qty 1

## 2022-09-12 MED ORDER — IPRATROPIUM-ALBUTEROL 0.5-2.5 (3) MG/3ML IN SOLN: 3.0000 mL | Freq: Four times a day (QID) | RESPIRATORY_TRACT | Status: DC | PRN

## 2022-09-12 MED ORDER — VITAMIN B-12 1000 MCG PO TABS
1000.0000 ug | ORAL_TABLET | Freq: Every day | ORAL | Status: DC
  Administered 2022-09-12 – 2022-09-14 (×3): 1000 ug via ORAL
  Filled 2022-09-12 (×3): qty 1

## 2022-09-12 NOTE — Progress Notes (Signed)
Physical Therapy Treatment Patient Details Name: Reginald Walsh MRN: [id number] DOB: 1965-07-14 Today's Date: 09/12/2022   History of Present Illness 57 y.o. male admitted 09/08/22 for R hip I&D. PMH: R THA 03/2021, R hip I&D June 2023 and July 2023, COPD, HTN, PTSD. Pt stated plan is for R THA to be resected at [facility] in the future, date is TBD.    PT Comments  Pt is progressing toward goals;  incr amb distance and decr assist needed overall this session; will continue to follow in acute setting.    If plan is discharge home, recommend the following: A little help with bathing/dressing/bathroom;Assistance with cooking/housework;Assist for transportation;Help with stairs or ramp for entrance   Can travel by private vehicle        Equipment Recommendations  None recommended by PT    Recommendations for Other Services       Precautions / Restrictions Precautions Precautions: Fall Restrictions RLE Weight Bearing: Weight bearing as tolerated     Mobility  Bed Mobility Overal bed mobility: Modified Independent             General bed mobility comments: HOB up, used rail    Transfers Overall transfer level: Needs assistance Equipment used: Rolling walker (2 wheels) Transfers: Sit to/from Stand Sit to Stand: Supervision           General transfer comment: for safety    Ambulation/Gait Ambulation/Gait assistance: Supervision Gait Distance (Feet): 200 Feet Assistive device: IV Pole Gait Pattern/deviations: Step-through pattern, Decreased stride length, Antalgic       General Gait Details: mildly antalgic gait, no overt LOB   Stairs             Wheelchair Mobility     Tilt Bed    Modified Rankin (Stroke Patients Only)       Balance                                            Cognition Arousal: Alert Behavior During Therapy: WFL for tasks assessed/performed Overall Cognitive Status: Within Functional Limits for tasks assessed                                           Exercises Total Joint Exercises Heel Slides: AAROM, Right, Supine, 10 reps Hip ABduction/ADduction: AAROM, Right, Supine, 10 reps    General Comments        Pertinent Vitals/Pain Pain Assessment Pain Assessment: 0-10 Pain Score: 4  Pain Location: R hip Pain Descriptors / Indicators: Sore Pain Intervention(s): Limited activity within patient's tolerance, Monitored during session, Premedicated before session, Repositioned, Ice applied    Home Living                          Prior Function            PT Goals (current goals can now be found in the care plan section) Acute Rehab PT Goals PT Goal Formulation: With patient/family Time For Goal Achievement: 09/16/22 Potential to Achieve Goals: Good Progress towards PT goals: Progressing toward goals    Frequency    7X/week      PT Plan      Co-evaluation  AM-PAC PT "6 Clicks" Mobility   Outcome Measure  Help needed turning from your back to your side while in a flat bed without using bedrails?: None Help needed moving from lying on your back to sitting on the side of a flat bed without using bedrails?: None Help needed moving to and from a bed to a chair (including a wheelchair)?: A Little Help needed standing up from a chair using your arms (e.g., wheelchair or bedside chair)?: A Little Help needed to walk in hospital room?: A Little Help needed climbing 3-5 steps with a railing? : A Little 6 Click Score: 20    End of Session   Activity Tolerance: Patient tolerated treatment well Patient left: in bed;with call bell/phone within reach   PT Visit Diagnosis: Difficulty in walking, not elsewhere classified (R26.2);Other abnormalities of gait and mobility (R26.89)     Time: 8469-6295 PT Time Calculation (min) (ACUTE ONLY): 19 min  Charges:    $Gait Training: 8-22 mins PT General Charges $$ ACUTE PT VISIT: 1 Visit                      Shaketa Serafin, PT  Acute Rehab Dept [facility]) [phone]  09/12/2022    [facility] 09/12/2022, 4:46 PM

## 2022-09-12 NOTE — Progress Notes (Signed)
Subjective: 4 Days Post-Op Procedure(s) (LRB): IRRIGATION AND DEBRIDEMENT HIP (Right) Patient reports pain as mild.   Patient is resting in bed on exam this morning.  No negative for adverse reaction to blood transfusion yesterday.  He does have some concerns that he did not receive as much of a bump from the transfusion.  However, no worsening pain and he had a good night sleep last night.   Objective: Vital signs in last 24 hours: Temp:  [97.4 F (36.3 C)-98.6 F (37 C)] 98.6 F (37 C) (08/17 0510) Pulse Rate:  [71-78] 71 (08/17 0510) Resp:  [12-17] 17 (08/17 0510) BP: (109-121)/(75-83) 117/80 (08/17 0510) SpO2:  [95 %-98 %] 96 % (08/17 0510)  Intake/Output from previous day:  Intake/Output Summary (Last 24 hours) at 09/12/2022 1236 Last data filed at 09/12/2022 0520 Gross per 24 hour  Intake 1967.2 ml  Output 3550 ml  Net -1582.8 ml     Intake/Output this shift: No intake/output data recorded.  Labs: Recent Labs    09/10/22 0329 09/11/22 0327 09/12/22 0058  HGB 8.4* 6.2* 7.5*   Recent Labs    09/11/22 0327 09/12/22 0058 09/12/22 1152  WBC 11.2* 8.7  --   RBC 2.18* 2.57* 2.51*  HCT 18.9* 22.3*  --   PLT 157 146*  --    Recent Labs    09/11/22 1648 09/12/22 0058  NA 127* 131*  K 3.8 3.9  CL 92* 96*  CO2 26 27  BUN 14 12  CREATININE 0.93 1.00  GLUCOSE 84 102*  CALCIUM 8.4* 8.3*   Recent Labs    09/11/22 0937  INR 1.0    Exam: General - Patient is Alert and Oriented Extremity - Neurologically intact Sensation intact distally Intact pulses distally Dorsiflexion/Plantar flexion intact Dressing - dressing spotty areas of dried blood on the proximal aspect. Motor Function - intact, moving foot and toes well on exam.   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   40 mm by echo   Chest pain of uncertain etiology    Normal coronaries after  abnormal Myoview Feb 2021 Echo March 2021 showed normal LVF- mild LVH    Chest pain of unknown etiology 02/2019   normal coronaries after abnormal Myoview   COPD (chronic obstructive pulmonary disease) (HCC)    Depression    Dyspnea    Encounter for screening for COVID-19 07/01/2021   Generalized enlarged lymph nodes 07/01/2021   GERD (gastroesophageal reflux disease)    Hypertension    normal RA dopplers   Hyponatremia    Other allergy status, other than to drugs and biological substances 07/01/2021   Other and unspecified complications of medical care, not elsewhere classified 07/01/2021   Other ill-defined and unknown causes of morbidity and mortality 07/01/2021   Overweight    Pain in left knee 07/01/2021   Pain in right foot 07/01/2021   PTSD (post-traumatic stress disorder)     Assessment/Plan: 4 Days Post-Op Procedure(s) (LRB): IRRIGATION AND DEBRIDEMENT HIP (Right) Principal Problem:   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   Weight as of this encounter: 104.3 kg. Advance diet Up with therapy  DVT Prophylaxis - Aspirin Weight bearing as tolerated.  Hemovac drain pulled  today, minimal output No active bleeding from his incision -some spotty areas of bleeding from where drains were pulled yesterday.  Thigh continues to be soft with no signs of active bleeding more bruising noted.  Cultures show no bacterial growth +fungus, pending cultures  ABLA: Hgb 7.5 this morning status post 2 units packed red blood cells.  Will recheck this afternoon to ensure stabilization.  Patient without any signs or symptoms of anemia at this time.  Hospitalist following along, appreciate their recommendations.    Yolonda Kida  Orthopedic Surgery [phone] 09/12/2022, 12:36 PM

## 2022-09-12 NOTE — Plan of Care (Signed)
Problem: Education: Goal: Knowledge of the prescribed therapeutic regimen will improve Outcome: Progressing   Problem: Activity: Goal: Ability to avoid complications of mobility impairment will improve Outcome: Progressing   Problem: Clinical Measurements: Goal: Postoperative complications will be avoided or minimized Outcome: Progressing   Problem: Pain Management: Goal: Pain level will decrease with appropriate interventions Outcome: Progressing   Haydee Salter, RN 09/12/22 8:07 AM

## 2022-09-12 NOTE — Progress Notes (Signed)
PT Cancellation Note  Patient Details Name: Reginald Walsh MRN: [id number] DOB: August 24, 1965   Cancelled Treatment:    Reason Eval/Treat Not Completed: Other (comment) (politely declined)   [facility] 09/12/2022, 11:17 AM

## 2022-09-12 NOTE — Progress Notes (Addendum)
[facility] consult progress note                                                                              Reginald Walsh, is a 57 y.o. male, DOB - 09/11/1965, MVH:[id number] Admit date - 09/08/2022    Outpatient Primary MD for the patient is [name], NP  LOS - 3  days  No chief complaint on file.      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 Charlann Boxer (left in 2021, right in 2023) complicated by history of right hip infected seroma requiring 1 year of suppressive antibiotics admitted by orthopedic service, underwent I&D and washout.  Followed by ID for antibiotics. TRH consulted for anemia and hyponatremia.   He had a drain in the right hip after the superficial fluid collection excision and debridement (notes state that hip joint was not opened), was putting out bloody material palpation, Hemovac was removed on 8/16. Operative cultures from 8/13 positive fungal culture with sensitivities pending. He is being referred to [facility] for definitive treatment consideration of hardware removal.    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 Charlann Boxer on 8/13 - Cultures positive for fungal infection, placed on oral fluconazole by Dr. Renold Don ( ID) .  - management per orthopedics    Active problems Anemia, normocytic, acute on chronic -Likely component of acute blood loss anemia post op. -No evidence of bleeding, no hematemesis, melena or hematochezia. -Received 2 units of packed RBCs on 8/16, hemoglobin 7.5, asymptomatic -Transfuse for hemoglobin < 7 -Upon further questioning, patient reported that he has longstanding history of anemia, previously had leukopenia as well.  Had seen hematology in [place] in 2007 and has not followed up since then.  Currently lives in [place], follows  [place]. -Patient has been up-to-date with colonoscopies, every 3 years, has had polyps but no other issues. -Will order anemia panel, iron studies, LDH, haptoglobin, reticulocytes -He will need to follow-up with hematology, will send referral for outpatient follow-up at [facility] for further workup. Addendum -Anemia panel reviewed, FE 27, ferritin 92, low percent saturation ratio 8, folate 7.7, vitamin B12, borderline low 197 -Iron allergy listed however will check with the pharmacy about the allergy, patient would benefit from IV iron.  If he does have iron allergy, can try ESA - also place on B12 with folic acid replacement -LDH normal, haptoglobin pending  Hyponatremia -Asymptomatic, Na levels gradually trending down during admission -Follow serum osmolality, urine osmolality, urine sodium, TSH -Sodium improving today, 131.  - Continue Cymbalta for now, will hold if starts trending down again  GERD -Continue PPI    Estimated body mass index is 28.75 kg/m as calculated from the following:   Height as of this encounter: 6\' 3"  (1.905 m).   Weight as of this encounter: 104.3 kg.  Code Status: Full CODE STATUS DVT Prophylaxis:  SCDs Start: 09/08/22 1744 Place TED hose Start: 09/08/22 1744   Level  of Care: Level of care: Med-Surg Family Communication: Updated patient Disposition Plan:      Remains inpatient appropriate:   Per orthopedics  Antimicrobials:   Anti-infectives (From admission, onward)    Start     Dose/Rate Route Frequency Ordered Stop   09/09/22 1015  fluconazole (DIFLUCAN) tablet 400 mg        400 mg Oral Daily 09/09/22 0917     09/08/22 2000  ceFAZolin (ANCEF) IVPB 2g/100 mL premix        2 g 200 mL/hr over 30 Minutes Intravenous Every 6 hours 09/08/22 1743 09/09/22 0650   09/08/22 1045  ceFAZolin (ANCEF) IVPB 2g/100 mL premix        2 g 200 mL/hr over 30 Minutes Intravenous On call to O.R. 09/08/22 1037 09/08/22 1327          Medications  aspirin   81 mg Oral BID   carvedilol  12.5 mg Oral BID   docusate sodium  100 mg Oral BID   DULoxetine  60 mg Oral Daily   fluconazole  400 mg Oral Daily   fluticasone  2 spray Each Nare Daily   montelukast  10 mg Oral Daily   pantoprazole  40 mg Oral Daily   polyethylene glycol  17 g Oral BID   rosuvastatin  20 mg Oral Daily   tranexamic acid  1,950 mg Oral Daily      Subjective:   Reginald Walsh was seen and examined today.  No acute complaints, frustrated with his hemoglobin not coming up significantly after 2 units.  No hematemesis, hematochezia or melena.  No active bleeding.  No chest pain, dizziness, shortness of breath, fevers.  Objective:   Vitals:   09/11/22 1241 09/11/22 1332 09/11/22 2215 09/12/22 0510  BP: 121/83 111/81 109/75 117/80  Pulse: 72 77 78 71  Resp: 16 12 17 17   Temp: 98 F (36.7 C) (!) 97.4 F (36.3 C) 98.1 F (36.7 C) 98.6 F (37 C)  TempSrc: Oral     SpO2: 98% 95% 95% 96%  Weight:      Height:        Intake/Output Summary (Last 24 hours) at 09/12/2022 1143 Last data filed at 09/12/2022 0520 Gross per 24 hour  Intake 1967.2 ml  Output 3550 ml  Net -1582.8 ml     Wt Readings from Last 3 Encounters:  09/08/22 104.3 kg  09/07/22 104.3 kg  07/29/22 105.4 kg     Exam General: Alert and oriented x 3, NAD Cardiovascular: S1 S2 auscultated,  RRR Respiratory: CT ABD Gastrointestinal: Soft, nontender, nondistended, + bowel sounds Ext: no pedal edema bilaterally Neuro: no new deficits Psych: Normal affect     Data Reviewed:  I have personally reviewed following labs    CBC Lab Results  Component Value Date   WBC 8.7 09/12/2022   RBC 2.57 (L) 09/12/2022   HGB 7.5 (L) 09/12/2022   HCT 22.3 (L) 09/12/2022   MCV 86.8 09/12/2022   MCH 29.2 09/12/2022   PLT 146 (L) 09/12/2022   MCHC 33.6 09/12/2022   RDW 15.0 09/12/2022   LYMPHSABS 1.5 08/14/2021   MONOABS 0.6 08/14/2021   EOSABS 0.8 (H) 08/14/2021   BASOSABS 0.1 08/14/2021     Last  metabolic panel Lab Results  Component Value Date   NA 131 (L) 09/12/2022   K 3.9 09/12/2022   CL 96 (L) 09/12/2022   CO2 27 09/12/2022   BUN 12 09/12/2022   CREATININE 1.00 09/12/2022  GLUCOSE 102 (H) 09/12/2022   GFRNONAA >60 09/12/2022   GFRAA 99 03/17/2019   CALCIUM 8.3 (L) 09/12/2022   PROT 5.6 (L) 09/10/2022   ALBUMIN 3.0 (L) 09/10/2022   LABGLOB 2.4 06/21/2020   AGRATIO 1.8 06/21/2020   BILITOT 0.3 09/10/2022   ALKPHOS 58 09/10/2022   AST 20 09/10/2022   ALT 16 09/10/2022   ANIONGAP 8 09/12/2022    CBG (last 3)  No results for input(s): "GLUCAP" in the last 72 hours.    Coagulation Profile: Recent Labs  Lab 09/11/22 0937  INR 1.0     Radiology Studies: I have personally reviewed the imaging studies  No results found.     Thad Ranger M.D. [facility] 09/12/2022, 11:43 AM  Available via Epic secure chat 7am-7pm After 7 pm, please refer to night coverage provider listed on amion.

## 2022-09-13 DIAGNOSIS — I10 Essential (primary) hypertension: Secondary | ICD-10-CM | POA: Diagnosis not present

## 2022-09-13 DIAGNOSIS — B49 Unspecified mycosis: Secondary | ICD-10-CM | POA: Diagnosis not present

## 2022-09-13 DIAGNOSIS — T8451XA Infection and inflammatory reaction due to internal right hip prosthesis, initial encounter: Secondary | ICD-10-CM | POA: Diagnosis not present

## 2022-09-13 DIAGNOSIS — D649 Anemia, unspecified: Secondary | ICD-10-CM | POA: Diagnosis not present

## 2022-09-13 LAB — BASIC METABOLIC PANEL
Anion gap: 8 (ref 5–15)
BUN: 10 mg/dL (ref 6–20)
CO2: 25 mmol/L (ref 22–32)
Calcium: 8.6 mg/dL — ABNORMAL LOW (ref 8.9–10.3)
Chloride: 103 mmol/L (ref 98–111)
Creatinine, Ser: 0.88 mg/dL (ref 0.61–1.24)
GFR, Estimated: >60 mL/min (ref >60)
Glucose, Bld: 95 mg/dL (ref 70–99)
Potassium: 3.9 mmol/L (ref 3.5–5.1)
Sodium: 136 mmol/L (ref 135–145)

## 2022-09-13 LAB — CBC
HCT: 24.4 % — ABNORMAL LOW (ref 39.0–52.0)
Hemoglobin: 7.9 g/dL — ABNORMAL LOW (ref 13.0–17.0)
MCH: 29.2 pg (ref 26.0–34.0)
MCHC: 32.4 g/dL (ref 30.0–36.0)
MCV: 90 fL (ref 80.0–100.0)
Platelets: 188 10*3/uL (ref 150–400)
RBC: 2.71 MIL/uL — ABNORMAL LOW (ref 4.22–5.81)
RDW: 15.9 % — ABNORMAL HIGH (ref 11.5–15.5)
WBC: 7.3 10*3/uL (ref 4.0–10.5)
nRBC: 0.4 % — ABNORMAL HIGH (ref 0.0–0.2)

## 2022-09-13 NOTE — Progress Notes (Signed)
[facility] consult progress note                                                                              Reginald Walsh, is a 57 y.o. male, DOB - 08-Apr-1965, NWG:[id number] Admit date - 09/08/2022    Outpatient Primary MD for the patient is Reginald Sellar, NP  LOS - 4  days  No chief complaint on file.      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 Charlann Boxer (left in 2021, right in 2023) complicated by history of right hip infected seroma requiring 1 year of suppressive antibiotics admitted by orthopedic service, underwent I&D and washout.  Followed by ID for antibiotics. TRH consulted for anemia and hyponatremia.   He had a drain in the right hip after the superficial fluid collection excision and debridement (notes state that hip joint was not opened), was putting out bloody material palpation, Hemovac was removed on 8/16. Operative cultures from 8/13 positive fungal culture with sensitivities pending. He is being referred to [facility] for definitive treatment consideration of hardware removal.    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 Charlann Boxer on 8/13 - Cultures positive for fungal infection, placed on oral fluconazole by Dr. Renold Don ( ID) .  - management per orthopedics    Active problems Anemia, normocytic, acute on chronic Iron deficiency, borderline B12 and folate -Likely component of acute blood loss anemia post op.  -No evidence of bleeding, no hematemesis, melena or hematochezia. -Received 2 units of packed RBCs on 8/16 -Transfuse for hemoglobin < 7 -Upon further questioning, patient reported that he has longstanding history of anemia, previously had leukopenia as well.  Had seen hematology in [place] in 2007 and has not followed up since then.  Currently lives in  [place], follows [place]. -Patient has been up-to-date with colonoscopies, every 3 years, has had polyps but no other issues. --Anemia panel on 8/17 Fe 27, ferritin 92, low percent saturation ratio 8, folate 7.7, vitamin B12, borderline low 197 -Placed on B12 with folic acid replacement -LDH normal, haptoglobin pending, -Received IV iron infusion on 8/17 with improvement in hemoglobin of 7.9 today -Discussed with the patient, states oral iron gives him significant constipation and nausea hence prefers outpatient IV iron infusions.  Will place ambulatory referral to hematology outpatient for further workup and IV iron infusion.  Hyponatremia -Asymptomatic, Na levels gradually trending down during admission -Urine osmolality 134, urine sodium 19, serum osmolarity 266 -Improved, sodium 136  GERD -Continue PPI    Estimated body mass index is 28.75 kg/m as calculated from the following:   Height as of this encounter: 6\' 3"  (1.905 m).   Weight as of this encounter: 104.3 kg.  Code Status: Full CODE STATUS DVT Prophylaxis:  SCDs Start: 09/08/22 1744 Place TED hose Start: 09/08/22 1744   Level of Care: Level of care: Med-Surg Family Communication: Updated patient Disposition Plan:      Remains inpatient appropriate:  Per orthopedics  Antimicrobials:   Anti-infectives (From admission, onward)    Start     Dose/Rate Route Frequency Ordered Stop   09/09/22 1015  fluconazole (DIFLUCAN) tablet 400 mg        400 mg Oral Daily 09/09/22 0917     09/08/22 2000  ceFAZolin (ANCEF) IVPB 2g/100 mL premix        2 g 200 mL/hr over 30 Minutes Intravenous Every 6 hours 09/08/22 1743 09/09/22 0650   09/08/22 1045  ceFAZolin (ANCEF) IVPB 2g/100 mL premix        2 g 200 mL/hr over 30 Minutes Intravenous On call to O.R. 09/08/22 1037 09/08/22 1327          Medications  aspirin  81 mg Oral BID   carvedilol  12.5 mg Oral BID   vitamin B-12  1,000 mcg Oral Daily   docusate sodium  100 mg Oral BID    DULoxetine  60 mg Oral Daily   fluconazole  400 mg Oral Daily   fluticasone  2 spray Each Nare Daily   folic acid  1 mg Oral Daily   montelukast  10 mg Oral Daily   pantoprazole  40 mg Oral Daily   polyethylene glycol  17 g Oral BID   rosuvastatin  20 mg Oral Daily      Subjective:   Reginald Walsh was seen and examined today.  Feeling better today, no acute complaints.  No active bleeding, no chest pain, dizziness, shortness of breath, no bleeding.    Objective:   Vitals:   09/12/22 1357 09/12/22 2227 09/13/22 0522 09/13/22 1334  BP: 111/68 109/74 120/78 119/75  Pulse: 80 80 82 77  Resp: 18 18 20 17   Temp: 98.1 F (36.7 C) 97.7 F (36.5 C) 97.6 F (36.4 C) 97.8 F (36.6 C)  TempSrc:  Oral    SpO2: 100% 97% 97% 98%  Weight:      Height:        Intake/Output Summary (Last 24 hours) at 09/13/2022 1455 Last data filed at 09/13/2022 1000 Gross per 24 hour  Intake 1965.01 ml  Output 4000 ml  Net -2035.99 ml     Wt Readings from Last 3 Encounters:  09/08/22 104.3 kg  09/07/22 104.3 kg  07/29/22 105.4 kg    Physical Exam General: Alert and oriented x 3, NAD Cardiovascular: S1 S2 clear, RRR.  Respiratory: CTAB Gastrointestinal: Soft, nontender, nondistended, NBS Ext: no pedal edema bilaterally Neuro: no new deficits Psych: Normal affect     Data Reviewed:  I have personally reviewed following labs    CBC Lab Results  Component Value Date   WBC 7.3 09/13/2022   RBC 2.71 (L) 09/13/2022   HGB 7.9 (L) 09/13/2022   HCT 24.4 (L) 09/13/2022   MCV 90.0 09/13/2022   MCH 29.2 09/13/2022   PLT 188 09/13/2022   MCHC 32.4 09/13/2022   RDW 15.9 (H) 09/13/2022   LYMPHSABS 1.5 08/14/2021   MONOABS 0.6 08/14/2021   EOSABS 0.8 (H) 08/14/2021   BASOSABS 0.1 08/14/2021     Last metabolic panel Lab Results  Component Value Date   NA 136 09/13/2022   K 3.9 09/13/2022   CL 103 09/13/2022   CO2 25 09/13/2022   BUN 10 09/13/2022   CREATININE 0.88 09/13/2022    GLUCOSE 95 09/13/2022   GFRNONAA >60 09/13/2022   GFRAA 99 03/17/2019   CALCIUM 8.6 (L) 09/13/2022   PROT 5.6 (L) 09/10/2022   ALBUMIN 3.0 (L) 09/10/2022  LABGLOB 2.4 06/21/2020   AGRATIO 1.8 06/21/2020   BILITOT 0.3 09/10/2022   ALKPHOS 58 09/10/2022   AST 20 09/10/2022   ALT 16 09/10/2022   ANIONGAP 8 09/13/2022    CBG (last 3)  No results for input(s): "GLUCAP" in the last 72 hours.    Coagulation Profile: Recent Labs  Lab 09/11/22 0937  INR 1.0     Radiology Studies: I have personally reviewed the imaging studies  No results found.     Thad Ranger M.D. [facility] 09/13/2022, 2:55 PM  Available via Epic secure chat 7am-7pm After 7 pm, please refer to night coverage provider listed on amion.

## 2022-09-13 NOTE — Plan of Care (Signed)
  Problem: Pain Management: Goal: Pain level will decrease with appropriate interventions Outcome: Progressing   Problem: Coping: Goal: Level of anxiety will decrease Outcome: Progressing   

## 2022-09-13 NOTE — Progress Notes (Signed)
Physical Therapy Treatment Patient Details Name: Reginald Walsh MRN: [id number] DOB: 1965/03/30 Today's Date: 09/13/2022   History of Present Illness 57 y.o. male admitted 09/08/22 for R hip I&D. PMH: R THA 03/2021, R hip I&D June 2023 and July 2023, COPD, HTN, PTSD. Pt stated plan is for R THA to be resected at [facility] in the future, date is TBD.    PT Comments  Pt making steady gains, good progress with use of SPC for gait today; pt in good spirits, spouse visiting at time of PT session today. Pt is hopeful to transfer to [facility] soon    If plan is discharge home, recommend the following: A little help with bathing/dressing/bathroom;Assistance with cooking/housework;Assist for transportation;Help with stairs or ramp for entrance   Can travel by private vehicle        Equipment Recommendations  None recommended by PT    Recommendations for Other Services       Precautions / Restrictions Precautions Precautions: Fall Restrictions RLE Weight Bearing: Weight bearing as tolerated     Mobility  Bed Mobility Overal bed mobility: Modified Independent             General bed mobility comments: HOB up    Transfers Overall transfer level: Needs assistance Equipment used: Rolling walker (2 wheels) Transfers: Sit to/from Stand Sit to Stand: Supervision, Modified independent (Device/Increase time)           General transfer comment: for safety    Ambulation/Gait Ambulation/Gait assistance: Supervision Gait Distance (Feet): 200 Feet Assistive device: Straight cane Gait Pattern/deviations: Step-through pattern, Decreased stride length, Antalgic       General Gait Details: slightly unsteady at times, no overt LOB.   Stairs             Wheelchair Mobility     Tilt Bed    Modified Rankin (Stroke Patients Only)       Balance                                            Cognition Arousal: Alert Behavior During Therapy: WFL for tasks  assessed/performed Overall Cognitive Status: Within Functional Limits for tasks assessed                                          Exercises Total Joint Exercises Heel Slides: AAROM, Right, Supine, 10 reps Hip ABduction/ADduction: AAROM, Right, Supine, 10 reps    General Comments        Pertinent Vitals/Pain Pain Assessment Pain Assessment: 0-10 Pain Score: 3  Pain Location: R hip Pain Descriptors / Indicators: Sore Pain Intervention(s): Limited activity within patient's tolerance, Monitored during session, Premedicated before session, Ice applied    Home Living                          Prior Function            PT Goals (current goals can now be found in the care plan section) Acute Rehab PT Goals PT Goal Formulation: With patient/family Time For Goal Achievement: 09/16/22 Potential to Achieve Goals: Good Progress towards PT goals: Progressing toward goals    Frequency    7X/week      PT Plan      Co-evaluation  AM-PAC PT "6 Clicks" Mobility   Outcome Measure  Help needed turning from your back to your side while in a flat bed without using bedrails?: None Help needed moving from lying on your back to sitting on the side of a flat bed without using bedrails?: None Help needed moving to and from a bed to a chair (including a wheelchair)?: A Little Help needed standing up from a chair using your arms (e.g., wheelchair or bedside chair)?: A Little Help needed to walk in hospital room?: A Little Help needed climbing 3-5 steps with a railing? : A Little 6 Click Score: 20    End of Session   Activity Tolerance: Patient tolerated treatment well Patient left: in bed;with call bell/phone within reach;with family/visitor present   PT Visit Diagnosis: Difficulty in walking, not elsewhere classified (R26.2);Other abnormalities of gait and mobility (R26.89)     Time: 1140-1157 PT Time Calculation (min) (ACUTE ONLY):  17 min  Charges:    $Gait Training: 8-22 mins PT General Charges $$ ACUTE PT VISIT: 1 Visit                     Kaniel Kiang, PT  Acute Rehab Dept [facility]) [phone]  09/13/2022    [facility] 09/13/2022, 1:22 PM

## 2022-09-13 NOTE — Progress Notes (Signed)
Orthopedics Progress Note  Subjective: Patient states that his pain control has been excellent. He is awaiting word on transport to [facility]  Objective:  Vitals:   09/12/22 2227 09/13/22 0522  BP: 109/74 120/78  Pulse: 80 82  Resp: 18 20  Temp: 97.7 F (36.5 C) 97.6 F (36.4 C)  SpO2: 97% 97%    General: Awake and alert  Musculoskeletal: Right hip dressings changed. Wound looks good with minimal drainage. Moderate swelling in the lateral upper hip and thigh area Neurovascularly intact  Lab Results  Component Value Date   WBC 7.3 09/13/2022   HGB 7.9 (L) 09/13/2022   HCT 24.4 (L) 09/13/2022   MCV 90.0 09/13/2022   PLT 188 09/13/2022       Component Value Date/Time   NA 136 09/13/2022 0340   NA 133 (L) 07/02/2020 0843   K 3.9 09/13/2022 0340   CL 103 09/13/2022 0340   CO2 25 09/13/2022 0340   GLUCOSE 95 09/13/2022 0340   BUN 10 09/13/2022 0340   BUN 11 07/02/2020 0843   CREATININE 0.88 09/13/2022 0340   CALCIUM 8.6 (L) 09/13/2022 0340   GFRNONAA >60 09/13/2022 0340   GFRAA 99 03/17/2019 1117    Lab Results  Component Value Date   INR 1.0 09/11/2022    Assessment/Plan: s/p Procedure(s): IRRIGATION AND DEBRIDEMENT HIP, superficial vs deep yeast infection Hgb remains low but up slightly Continue supportive care with Dr Charlann Boxer team returning tomorrow for ongoing care plan for hip  Patient has concerns about transport to [facility] and request skilled transport like [facility]	[place] R. Ranell Patrick, MD 09/13/2022 9:07 AM

## 2022-09-13 NOTE — Plan of Care (Signed)
  Problem: Education: Goal: Knowledge of the prescribed therapeutic regimen will improve Outcome: Progressing   Problem: Pain Management: Goal: Pain level will decrease with appropriate interventions Outcome: Progressing   Problem: Education: Goal: Knowledge of General Education information will improve Description: Including pain rating scale, medication(s)/side effects and non-pharmacologic comfort measures Outcome: Progressing   

## 2022-09-14 ENCOUNTER — Other Ambulatory Visit (HOSPITAL_COMMUNITY): Payer: Self-pay

## 2022-09-14 DIAGNOSIS — D649 Anemia, unspecified: Secondary | ICD-10-CM | POA: Diagnosis not present

## 2022-09-14 DIAGNOSIS — I10 Essential (primary) hypertension: Secondary | ICD-10-CM | POA: Diagnosis not present

## 2022-09-14 DIAGNOSIS — B49 Unspecified mycosis: Secondary | ICD-10-CM | POA: Diagnosis not present

## 2022-09-14 DIAGNOSIS — D508 Other iron deficiency anemias: Secondary | ICD-10-CM

## 2022-09-14 DIAGNOSIS — T8451XA Infection and inflammatory reaction due to internal right hip prosthesis, initial encounter: Secondary | ICD-10-CM | POA: Diagnosis not present

## 2022-09-14 LAB — CBC
HCT: 26.1 % — ABNORMAL LOW (ref 39.0–52.0)
Hemoglobin: 8.5 g/dL — ABNORMAL LOW (ref 13.0–17.0)
MCH: 29.6 pg (ref 26.0–34.0)
MCHC: 32.6 g/dL (ref 30.0–36.0)
MCV: 90.9 fL (ref 80.0–100.0)
Platelets: 220 10*3/uL (ref 150–400)
RBC: 2.87 MIL/uL — ABNORMAL LOW (ref 4.22–5.81)
RDW: 16.6 % — ABNORMAL HIGH (ref 11.5–15.5)
WBC: 5 10*3/uL (ref 4.0–10.5)
nRBC: 0.4 % — ABNORMAL HIGH (ref 0.0–0.2)

## 2022-09-14 LAB — BASIC METABOLIC PANEL
Anion gap: 7 (ref 5–15)
BUN: 7 mg/dL (ref 6–20)
CO2: 28 mmol/L (ref 22–32)
Calcium: 9 mg/dL (ref 8.9–10.3)
Chloride: 100 mmol/L (ref 98–111)
Creatinine, Ser: 0.78 mg/dL (ref 0.61–1.24)
GFR, Estimated: >60 mL/min (ref >60)
Glucose, Bld: 103 mg/dL — ABNORMAL HIGH (ref 70–99)
Potassium: 3.9 mmol/L (ref 3.5–5.1)
Sodium: 135 mmol/L (ref 135–145)

## 2022-09-14 LAB — HAPTOGLOBIN: Haptoglobin: 163 mg/dL (ref 29–370)

## 2022-09-14 MED ORDER — FOLIC ACID 1 MG PO TABS
1.0000 mg | ORAL_TABLET | Freq: Every day | ORAL | 3 refills | Status: AC
  Filled 2022-09-14: qty 30, 30d supply, fill #0

## 2022-09-14 MED ORDER — POLYETHYLENE GLYCOL 3350 17 GM/SCOOP PO POWD
17.0000 g | Freq: Two times a day (BID) | ORAL | 0 refills | Status: AC
  Filled 2022-09-14: qty 238, 7d supply, fill #0

## 2022-09-14 MED ORDER — FLUCONAZOLE 200 MG PO TABS
400.0000 mg | ORAL_TABLET | Freq: Every day | ORAL | 1 refills | Status: AC
  Filled 2022-09-14: qty 30, 15d supply, fill #0

## 2022-09-14 MED ORDER — ASPIRIN 81 MG PO CHEW
81.0000 mg | CHEWABLE_TABLET | Freq: Two times a day (BID) | ORAL | 0 refills | Status: AC
  Filled 2022-09-14: qty 42, 21d supply, fill #0

## 2022-09-14 MED ORDER — CYANOCOBALAMIN 1000 MCG PO TABS
1000.0000 ug | ORAL_TABLET | Freq: Every day | ORAL | 3 refills | Status: AC
  Filled 2022-09-14: qty 30, 30d supply, fill #0

## 2022-09-14 MED ORDER — METHOCARBAMOL 500 MG PO TABS
500.0000 mg | ORAL_TABLET | Freq: Four times a day (QID) | ORAL | 2 refills | Status: AC | PRN
  Filled 2022-09-14: qty 40, 10d supply, fill #0

## 2022-09-14 MED ORDER — HYDROCODONE-ACETAMINOPHEN 7.5-325 MG PO TABS
1.0000 | ORAL_TABLET | ORAL | 0 refills | Status: DC | PRN
  Filled 2022-09-14: qty 42, 7d supply, fill #0

## 2022-09-14 NOTE — TOC Transition Note (Signed)
Transition of Care [facility]) - CM/SW Discharge Note  Patient Details  Name: Monty Caliendo MRN: [id number] Date of Birth: Nov 08, 1965  [facility] CM/SW Contact:  Ewing Schlein, LCSW Phone Number: 09/14/2022, 9:23 AM  Clinical Narrative: There are no identified discharge needs at this time. TOC signing off.    Final next level of care: Home/Self Care Barriers to Discharge: Barriers Resolved  Patient Goals and CMS Choice Choice offered to / list presented to : NA  Discharge Plan and Services Additional resources added to the After Visit Summary for   In-house Referral: Clinical Social Work       DME Arranged: N/A DME Agency: NA  Social Determinants of Health (SDOH) Interventions SDOH Screenings   Food Insecurity: No Food Insecurity (09/08/2022)  Housing: Low Risk  (09/08/2022)  Transportation Needs: No Transportation Needs (09/08/2022)  Utilities: Not At Risk (09/08/2022)  Depression (PHQ2-9): Medium Risk (07/01/2021)  Social Connections: Unknown (06/10/2021)   Received from [facility]  Tobacco Use: High Risk (09/08/2022)   Readmission Risk Interventions    07/30/2021   10:37 AM 07/09/2021    1:00 PM  Readmission Risk Prevention Plan  Post Dischage Appt  Complete  Medication Screening  Complete  Transportation Screening Complete Complete  PCP or Specialist Appt within 5-7 Days Complete   Home Care Screening Complete   Medication Review (RN CM) Complete

## 2022-09-14 NOTE — Progress Notes (Addendum)
[facility] consult progress note                                                                              Reginald Walsh, is a 56 y.o. male, DOB - 08/14/1965, SEG:[id number] Admit date - 09/08/2022    Outpatient Primary MD for the patient is [name], NP  LOS - 5  days  No chief complaint on file.      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 Charlann Boxer (left in 2021, right in 2023) complicated by history of right hip infected seroma requiring 1 year of suppressive antibiotics admitted by orthopedic service, underwent I&D and washout.  Followed by ID for antibiotics. [facility] consulted for anemia and hyponatremia.   He had a drain in the right hip after the superficial fluid collection excision and debridement (notes state that hip joint was not opened), was putting out bloody material palpation, Hemovac was removed on 8/16. Operative cultures from 8/13 positive fungal culture with sensitivities pending. [facility] is being [facility] for definitive treatment consideration of hardware removal.    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 Charlann Boxer on 8/13 - Cultures positive for fungal infection, placed on oral fluconazole by Dr. Renold Don ( ID) .  - management per orthopedics    Active problems Anemia, normocytic, acute on chronic Iron deficiency, borderline B12 and folate -Likely component of acute blood loss anemia post op.  -No evidence of bleeding, no hematemesis, melena or hematochezia. -Received 2 units of packed RBCs on 8/16 - patient reported that he has longstanding history of anemia, previously had leukopenia as well.  Had seen hematology in [place] in 2007 and has not followed up since then.  Currently lives in [place], follows [place]. -Patient has been up-to-date with  colonoscopies, every 3 years, has had polyps but no other issues. --Anemia panel on 8/17 Fe 27, ferritin 92, low percent saturation ratio 8, folate 7.7, vitamin B12, borderline low 197 -Placed on B12 with folic acid replacement -LDH normal, haptoglobin pending, -Received IV iron infusion on 8/17 -Discussed with the patient, states oral iron gives him significant constipation and nausea hence prefers outpatient IV iron infusions.  -H&H stable, 8.5 and improving -Sent ambulatory referral to hematology outpatient for further workup and IV iron infusion. -Sent prescriptions for B12, folic acid, will need outpatient IV iron infusion, outpatient workup by hematology  Hyponatremia -Asymptomatic, Na levels gradually trending down during admission -Urine osmolality 134, urine sodium 19, serum osmolarity 266 -Improved, sodium 135 -Tolerating diet  GERD -Continue PPI    Estimated body mass index is 28.75 kg/m as calculated from the following:   Height as of this encounter: 6\' 3"  (1.905 m).   Weight as of this encounter: 104.3 kg.  Code Status: Full CODE STATUS DVT Prophylaxis:  SCDs Start: 09/08/22 1744 Place TED hose Start: 09/08/22 1744   Level of Care: Level of care: Med-Surg Family Communication: Updated patient Disposition Plan:  Remains inpatient appropriate:   Per orthopedics.  I will sign off, please call for any questions  Antimicrobials:   Anti-infectives (From admission, onward)    Start     Dose/Rate Route Frequency Ordered Stop   09/14/22 0000  fluconazole (DIFLUCAN) 200 MG tablet        400 mg Oral Daily 09/14/22 0724     09/09/22 1015  fluconazole (DIFLUCAN) tablet 400 mg        400 mg Oral Daily 09/09/22 0917     09/08/22 2000  ceFAZolin (ANCEF) IVPB 2g/100 mL premix        2 g 200 mL/hr over 30 Minutes Intravenous Every 6 hours 09/08/22 1743 09/09/22 0650   09/08/22 1045  ceFAZolin (ANCEF) IVPB 2g/100 mL premix        2 g 200 mL/hr over 30 Minutes Intravenous  On call to O.R. 09/08/22 1037 09/08/22 1327          Medications  aspirin  81 mg Oral BID   carvedilol  12.5 mg Oral BID   vitamin B-12  1,000 mcg Oral Daily   docusate sodium  100 mg Oral BID   DULoxetine  60 mg Oral Daily   fluconazole  400 mg Oral Daily   fluticasone  2 spray Each Nare Daily   folic acid  1 mg Oral Daily   montelukast  10 mg Oral Daily   pantoprazole  40 mg Oral Daily   polyethylene glycol  17 g Oral BID   rosuvastatin  20 mg Oral Daily      Subjective:   Reginald Walsh was seen and examined today.  Doing well, states he is going home today, wife at the bedside.  No acute complaints.    Objective:   Vitals:   09/13/22 0522 09/13/22 1334 09/13/22 2241 09/14/22 0542  BP: 120/78 119/75 126/82 115/86  Pulse: 82 77 85 80  Resp: 20 17 16 16   Temp: 97.6 F (36.4 C) 97.8 F (36.6 C) 98.1 F (36.7 C) (!) 97.5 F (36.4 C)  TempSrc:   Oral Oral  SpO2: 97% 98% 99% 97%  Weight:      Height:        Intake/Output Summary (Last 24 hours) at 09/14/2022 0931 Last data filed at 09/14/2022 0554 Gross per 24 hour  Intake 1248.75 ml  Output --  Net 1248.75 ml     Wt Readings from Last 3 Encounters:  09/08/22 104.3 kg  09/07/22 104.3 kg  07/29/22 105.4 kg    Physical Exam General: Alert and oriented x 3, NAD Cardiovascular: S1 S2 clear, RRR.  Respiratory: CTAB Gastrointestinal: Soft, nontender, nondistended, NBS Ext: no pedal edema bilaterally Neuro: no new deficits Psych: Normal affect     Data Reviewed:  I have personally reviewed following labs    CBC Lab Results  Component Value Date   WBC 5.0 09/14/2022   RBC 2.87 (L) 09/14/2022   HGB 8.5 (L) 09/14/2022   HCT 26.1 (L) 09/14/2022   MCV 90.9 09/14/2022   MCH 29.6 09/14/2022   PLT 220 09/14/2022   MCHC 32.6 09/14/2022   RDW 16.6 (H) 09/14/2022   LYMPHSABS 1.5 08/14/2021   MONOABS 0.6 08/14/2021   EOSABS 0.8 (H) 08/14/2021   BASOSABS 0.1 08/14/2021     Last metabolic panel Lab  Results  Component Value Date   NA 135 09/14/2022   K 3.9 09/14/2022   CL 100 09/14/2022   CO2 28 09/14/2022   BUN 7 09/14/2022  CREATININE 0.78 09/14/2022   GLUCOSE 103 (H) 09/14/2022   GFRNONAA >60 09/14/2022   GFRAA 99 03/17/2019   CALCIUM 9.0 09/14/2022   PROT 5.6 (L) 09/10/2022   ALBUMIN 3.0 (L) 09/10/2022   LABGLOB 2.4 06/21/2020   AGRATIO 1.8 06/21/2020   BILITOT 0.3 09/10/2022   ALKPHOS 58 09/10/2022   AST 20 09/10/2022   ALT 16 09/10/2022   ANIONGAP 7 09/14/2022    CBG (last 3)  No results for input(s): "GLUCAP" in the last 72 hours.    Coagulation Profile: Recent Labs  Lab 09/11/22 0937  INR 1.0     Radiology Studies: I have personally reviewed the imaging studies  No results found.     Thad Ranger M.D. [facility] 09/14/2022, 9:31 AM  Available via Epic secure chat 7am-7pm After 7 pm, please refer to night coverage provider listed on amion.

## 2022-09-14 NOTE — Plan of Care (Signed)
  Problem: Pain Management: Goal: Pain level will decrease with appropriate interventions Outcome: Progressing   Problem: Coping: Goal: Level of anxiety will decrease Outcome: Progressing   

## 2022-09-14 NOTE — Progress Notes (Signed)
D/c instructions reviewed w/ patient and wife at the bedside - both verbalized an understanding. No othe questions at this time. PIV 's to be d/c'd by primary RN. Meds form outpatient pharmacy to be delivered by 1030 and then pt will be able to leave. Pt verbalized an understanding that he should call tomorrow for a follow up appt w/ [facility] if he does not hear from the office today

## 2022-09-14 NOTE — Progress Notes (Signed)
Patient ID: Reginald Walsh, male   DOB: 05/24/1965, 57 y.o.   MRN: [id number] Subjective: 6 Days Post-Op Procedure(s) (LRB): IRRIGATION AND DEBRIDEMENT HIP (Right)    Patient reports pain as moderate. No events  Objective:   VITALS:   Vitals:   09/13/22 2241 09/14/22 0542  BP: 126/82 115/86  Pulse: 85 80  Resp: 16 16  Temp: 98.1 F (36.7 C) (!) 97.5 F (36.4 C)  SpO2: 99% 97%    Neurovascular intact Incision: scant drainage Associated significant subcutaneous hematoma with ecchymosis Post op dressing removed and Aquacell placed over wound  LABS Recent Labs    09/12/22 0058 09/12/22 1411 09/13/22 0340 09/14/22 0352  HGB 7.5* 7.7* 7.9* 8.5*  HCT 22.3* 23.1* 24.4* 26.1*  WBC 8.7  --  7.3 5.0  PLT 146*  --  188 220    Recent Labs    09/12/22 0058 09/13/22 0340 09/14/22 0352  NA 131* 136 135  K 3.9 3.9 3.9  BUN 12 10 7   CREATININE 1.00 0.88 0.78  GLUCOSE 102* 95 103*    Recent Labs    09/11/22 0937  INR 1.0     Assessment/Plan: 6 Days Post-Op Procedure(s) (LRB): IRRIGATION AND DEBRIDEMENT HIP (Right)   Up with therapy  ABLA - required 2 units of PRBCs but Hgb stable and improving  Discharge to home today with meds provided from [facility] pharmacy including pain meds and Fluconazole  We have consulted the [facility] to see him as outpatient to set up surgical plan  RTC in 2 weeks

## 2022-09-19 NOTE — Discharge Summary (Signed)
Physician Discharge Summary   Patient ID: Reginald Walsh MRN: [id number] DOB/AGE: 1965-01-30 57 y.o.  Admit date: 09/08/2022 Discharge date: 09/14/2022  Primary Diagnosis: Right hip superficial versus deep fungal infection status post right total hip arthroplasty.   Admission Diagnoses:  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   40 mm by echo   Chest pain of uncertain etiology    Normal coronaries after abnormal Myoview Feb 2021 Echo March 2021 showed normal LVF- mild LVH    Chest pain of unknown etiology 02/2019   normal coronaries after abnormal Myoview   COPD (chronic obstructive pulmonary disease) (HCC)    Depression    Dyspnea    Encounter for screening for COVID-19 07/01/2021   Generalized enlarged lymph nodes 07/01/2021   GERD (gastroesophageal reflux disease)    Hypertension    normal RA dopplers   Hyponatremia    Other allergy status, other than to drugs and biological substances 07/01/2021   Other and unspecified complications of medical care, not elsewhere classified 07/01/2021   Other ill-defined and unknown causes of morbidity and mortality 07/01/2021   Overweight    Pain in left knee 07/01/2021   Pain in right foot 07/01/2021   PTSD (post-traumatic stress disorder)    Discharge Diagnoses:   Principal Problem:   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   Weight as of this encounter: 104.3 kg.  Procedure:  Procedure(s) (LRB): IRRIGATION AND DEBRIDEMENT HIP (Right)   Consults: ID  HPI: The patient is a pleasant 57 year old male with history of right total hip arthroplasty in 2023.  His postoperative course was unfortunately complicated by superficial infected seroma.  This was diagnosed I believe with a  staph species.  He  underwent I and D of the right hip.  He was placed on antibiotics and was on suppressive doxycycline.  He has been doing very well until recently when he presented to the office last week with increased swelling, pain and concern for  wound breakdown anteriorly.  In the office, I aspirated his hip.  Preliminary cultures came back concerning for yeast with eventual culture growth of Candida.  We had lengthy discussion in the office regarding the significant challenges regarding  management of yeast infections.  I had some initial concerns regarding his skin integrity.  Given the infrequent nature in terms of management of yeast infections I at least wanted to superficially debride this wound from a wound standpoint, but also to  try to provide some relief and prevent further spread of illness.  We did discuss that ultimately he may necessitate more definitive treatment in the form of resection arthroplasty.  I reviewed with him the risks and the potential need for future  surgery.  At the time of this charting I will subsequently review with the patient that I may choose to refer him to a tertiary care center for definitive management of yeast infection based on the challenges of this unfortunate and rare infection  etiology.  Consent was obtained for management of his right hip currently.  Laboratory Data: Admission on 09/08/2022, Discharged on 09/14/2022  Component Date Value Ref Range Status   ABO/RH(D) 09/08/2022 A POS   Final   Antibody Screen 09/08/2022 NEG  Final   Sample Expiration 09/08/2022 09/11/2022,2359   Final   Unit Number 09/08/2022 [id number]   Final   Blood Component Type 09/08/2022 RED CELLS,LR   Final   Unit division 09/08/2022 00   Final   Status of Unit 09/08/2022 ISSUED,FINAL   Final   Transfusion Status 09/08/2022 OK TO TRANSFUSE   Final   Crossmatch Result 09/08/2022 Compatible   Final   Unit Number 09/08/2022 [id number]   Final   Blood Component Type 09/08/2022 RED  CELLS,LR   Final   Unit division 09/08/2022 00   Final   Status of Unit 09/08/2022 ISSUED,FINAL   Final   Transfusion Status 09/08/2022 OK TO TRANSFUSE   Final   Crossmatch Result 09/08/2022    Final                   Value:Compatible Performed at [facility], [address] [place], [place] [address]    Specimen Description 09/08/2022    Final                   Value:FLUID Performed at [facility], [address] [place], [place] [address]    Special Requests 09/08/2022    Final                   Value:NONE Performed at [facility], [address], [place], [place] [address]    Gram Stain 09/08/2022    Final                   Value:RARE WBC PRESENT,BOTH PMN AND MONONUCLEAR NO ORGANISMS SEEN    Culture 09/08/2022    Final                   Value:RARE CANDIDA ALBICANS NO ANAEROBES ISOLATED CONTINUING TO HOLD Performed at [facility], [address] [place], [place] [address]    Report Status 09/08/2022 PENDING   Incomplete   Fungus Stain 09/08/2022 Final report (A)   Final   Comment: (NOTE) Performed At: [facility] [address] [place], [place] [id number] Jolene Schimke MD QQ:[id number]    Fungus (Mycology) Culture 09/08/2022 PENDING   Incomplete   Fungal Source 09/08/2022 FLUID   Final   Performed at [facility], [address] [place], [place] [address]   WBC 09/09/2022 3.0 (L)  4.0 - 10.5 K/uL Final   RBC 09/09/2022 3.49 (L)  4.22 - 5.81 MIL/uL Final   Hemoglobin 09/09/2022 9.8 (L)  13.0 - 17.0 g/dL Final   HCT 18/84/1660 30.7 (L)  39.0 - 52.0 % Final   MCV 09/09/2022 88.0  80.0 - 100.0 fL Final   MCH 09/09/2022 28.1  26.0 - 34.0 pg Final   MCHC 09/09/2022 31.9  30.0 - 36.0 g/dL Final   RDW 63/01/6008 15.6 (H)  11.5 - 15.5 % Final   Platelets 09/09/2022 147 (L)  150 - 400 K/uL Final   nRBC 09/09/2022 0.0  0.0 - 0.2 % Final   Performed at [facility], [address] [place], [place] [address]   Sodium 09/09/2022 128 (L)  135 - 145 mmol/L Final   Potassium 09/09/2022 4.2  3.5 - 5.1 mmol/L Final   Chloride 09/09/2022 95 (L)  98 - 111 mmol/L Final   CO2 09/09/2022 23  22 - 32 mmol/L Final   Glucose, Bld 09/09/2022 130 (H)  70 - 99 mg/dL Final   Glucose  reference range applies only to samples taken after fasting for at least 8 hours.   BUN 09/09/2022 9  6 - 20 mg/dL Final   Creatinine, Ser 09/09/2022 0.64  0.61 - 1.24 mg/dL Final   Calcium 16/10/9602 8.5 (L)  8.9 - 10.3 mg/dL Final   GFR, Estimated 09/09/2022 >60  >60 mL/min Final   Comment: (NOTE) Calculated using the CKD-EPI Creatinine Equation (2021)    Anion gap 09/09/2022 10  5 - 15 Final   Performed at [facility], [address], [place], [place] [address]   WBC 09/10/2022 10.1  4.0 - 10.5 K/uL Final   RBC 09/10/2022 2.89 (L)  4.22 - 5.81 MIL/uL Final   Hemoglobin 09/10/2022 8.4 (L)  13.0 - 17.0 g/dL Final   HCT 11/91/4782 25.7 (L)  39.0 - 52.0 % Final   MCV 09/10/2022 88.9  80.0 - 100.0 fL Final   MCH 09/10/2022 29.1  26.0 - 34.0 pg Final   MCHC 09/10/2022 32.7  30.0 - 36.0 g/dL Final   RDW 95/62/1308 15.8 (H)  11.5 - 15.5 % Final   Platelets 09/10/2022 175  150 - 400 K/uL Final   nRBC 09/10/2022 0.4 (H)  0.0 - 0.2 % Final   Performed at [facility], [address] [place], [place] [address]   Sodium 09/10/2022 126 (L)  135 - 145 mmol/L Final   Potassium 09/10/2022 3.8  3.5 - 5.1 mmol/L Final   Chloride 09/10/2022 90 (L)  98 - 111 mmol/L Final   CO2 09/10/2022 27  22 - 32 mmol/L Final   Glucose, Bld 09/10/2022 102 (H)  70 - 99 mg/dL Final   Glucose reference range applies only to samples taken after fasting for at least 8 hours.   BUN 09/10/2022 16  6 - 20 mg/dL Final   Creatinine, Ser 09/10/2022 0.97  0.61 - 1.24 mg/dL Final   Calcium 69/62/9528 8.3 (L)  8.9 - 10.3 mg/dL Final   Total Protein 41/32/4401 5.6 (L)  6.5 -  8.1 g/dL Final   Albumin 02/72/5366 3.0 (L)  3.5 - 5.0 g/dL Final   AST 44/03/4740 20  15 - 41 U/L Final   ALT 09/10/2022 16  0 - 44 U/L Final   Alkaline Phosphatase 09/10/2022 58  38 - 126 U/L Final   Total Bilirubin 09/10/2022 0.3  0.3 - 1.2 mg/dL Final   GFR, Estimated 09/10/2022 >60  >60 mL/min Final   Comment: (NOTE) Calculated using the CKD-EPI Creatinine Equation (2021)    Anion gap 09/10/2022 9  5 - 15 Final   Performed at [facility], [address], [place], [place] [address]   WBC 09/11/2022 11.2 (H)  4.0 - 10.5 K/uL Final   RBC 09/11/2022 2.18 (L)  4.22 - 5.81 MIL/uL Final   Hemoglobin 09/11/2022 6.2 (LL)  13.0 - 17.0 g/dL Final   Comment: REPEATED TO VERIFY THIS CRITICAL RESULT HAS VERIFIED AND BEEN CALLED TO S.HUERTAN,RN BY ATCHISON,MARY ON 08 16 2024 AT 0407, AND HAS BEEN READ BACK.     HCT 09/11/2022 18.9 (L)  39.0 - 52.0 % Final   MCV 09/11/2022 86.7  80.0 - 100.0 fL Final   MCH 09/11/2022 28.4  26.0 - 34.0 pg Final   MCHC 09/11/2022 32.8  30.0 - 36.0 g/dL Final   RDW 87/56/4332 15.7 (H)  11.5 - 15.5 % Final   Platelets 09/11/2022 157  150 - 400 K/uL Final   nRBC 09/11/2022 0.4 (H)  0.0 - 0.2 % Final   Performed at [facility], [address], [place], [place] [address]   Result 1 09/08/2022 Yeast observed (A)   Final   Comment: (NOTE) Performed At: [facility] [address] [place], [place] [id number] Jolene Schimke MD XL:[id number]    Order Confirmation 09/11/2022    Final                   Value:ORDER PROCESSED BY BLOOD BANK Performed at [facility], [address], [place], [place] [address]    ISSUE DATE / TIME 09/08/2022 [id number]   Final   Blood Product Unit Number 09/08/2022 [id number]   Final   PRODUCT CODE 09/08/2022 [id number]   Final   Unit Type and Rh 09/08/2022 6200   Final   Blood Product Expiration Date 09/08/2022 166063016010   Final   ISSUE DATE / TIME 09/08/2022  [id number]   Final   Blood Product Unit Number 09/08/2022 [id number]   Final   PRODUCT CODE 09/08/2022 E0336V00   Final   Unit Type and Rh 09/08/2022 6200   Final   Blood Product Expiration Date 09/08/2022 [id number]   Final   Sodium 09/11/2022 125 (L)  135 - 145 mmol/L Final   Potassium 09/11/2022 3.3 (L)  3.5 - 5.1 mmol/L Final   Chloride 09/11/2022 91 (L)  98 - 111 mmol/L Final   CO2 09/11/2022 25  22 - 32 mmol/L Final   Glucose, Bld 09/11/2022 94  70 - 99 mg/dL Final   Glucose reference range applies only to samples taken after fasting for at least 8 hours.   BUN 09/11/2022 15  6 - 20 mg/dL Final   Creatinine, Ser 09/11/2022 0.94  0.61 - 1.24 mg/dL Final   Calcium 07/22/9483 8.3 (L)  8.9 - 10.3 mg/dL Final   GFR, Estimated 09/11/2022 >60  >60 mL/min Final   Comment: (NOTE) Calculated using the CKD-EPI Creatinine Equation (2021)    Anion gap 09/11/2022 9  5 - 15 Final   Performed at [facility], [address], [place], [place] [address]   Sodium 09/11/2022 127 (L)  135 - 145 mmol/L Final   Potassium 09/11/2022 3.8  3.5 - 5.1 mmol/L Final   Chloride 09/11/2022 92 (L)  98 - 111 mmol/L Final   CO2 09/11/2022 26  22 - 32 mmol/L Final   Glucose, Bld 09/11/2022 84  70 - 99 mg/dL Final   Glucose reference range applies only to samples taken after fasting for at least 8 hours.   BUN 09/11/2022 14  6 - 20 mg/dL Final   Creatinine, Ser 09/11/2022 0.93  0.61 - 1.24 mg/dL Final   Calcium 35/00/9381 8.4 (L)  8.9 - 10.3 mg/dL Final   GFR, Estimated 09/11/2022 >60  >60 mL/min Final   Comment: (NOTE) Calculated using the CKD-EPI Creatinine Equation (2021)    Anion gap 09/11/2022 9  5 - 15 Final   Performed at [facility], [address] [place], [place] [address]   Prothrombin Time 09/11/2022 13.2  11.4 - 15.2 seconds Final   INR 09/11/2022 1.0  0.8 - 1.2 Final   Comment: (NOTE) INR goal varies based on device and disease states. Performed at  [facility], [address], [place], [place] [address]    Sodium 09/12/2022 131 (L)  135 - 145 mmol/L Final   Potassium 09/12/2022 3.9  3.5 - 5.1 mmol/L Final   Chloride 09/12/2022 96 (L)  98 - 111 mmol/L Final   CO2 09/12/2022 27  22 - 32 mmol/L Final   Glucose, Bld 09/12/2022 102 (H)  70 - 99 mg/dL Final   Glucose reference range applies only to samples taken after fasting for at least 8 hours.   BUN 09/12/2022 12  6 - 20 mg/dL Final   Creatinine, Ser 09/12/2022 1.00  0.61 - 1.24 mg/dL Final   Calcium 11/91/4782 8.3 (L)  8.9 - 10.3 mg/dL Final   GFR, Estimated 09/12/2022 >60  >60 mL/min Final   Comment: (NOTE) Calculated using the CKD-EPI Creatinine Equation (2021)    Anion gap 09/12/2022 8  5 - 15 Final   Performed at [facility], [address] [place], [place] [address]   WBC 09/12/2022 8.7  4.0 - 10.5 K/uL Final   RBC 09/12/2022 2.57 (L)  4.22 - 5.81 MIL/uL Final   Hemoglobin 09/12/2022 7.5 (L)  13.0 - 17.0 g/dL Final   HCT 30/86/5784 22.3 (L)  39.0 - 52.0 % Final   MCV 09/12/2022 86.8  80.0 - 100.0 fL Final   MCH 09/12/2022 29.2  26.0 - 34.0 pg Final   MCHC 09/12/2022 33.6  30.0 - 36.0 g/dL Final   RDW 69/62/9528 15.0  11.5 - 15.5 % Final   Platelets 09/12/2022 146 (L)  150 - 400 K/uL Final   nRBC 09/12/2022 0.7 (H)  0.0 - 0.2 % Final   Performed at [facility], [address] [place], [place] [address]   Osmolality 09/11/2022 266 (L)  275 - 295 mOsm/kg Final   Performed at [facility], [address], [place], [place] [address]   Osmolality, Ur 09/12/2022 134 (L)  300 - 900 mOsm/kg Final   Performed at [facility], [address], [place], [place] [address]   Sodium, Ur 09/12/2022 19  mmol/L Final   Performed at [facility], [address] [place], [place] [address]   Vitamin B-12 09/12/2022 197  180 - 914 pg/mL Final   Comment: (NOTE) This assay is not validated for testing  neonatal or myeloproliferative syndrome specimens for Vitamin B12 levels. Performed at [facility], [address], [place], [place] [address]    Folate 09/12/2022 7.7  >5.9 ng/mL Final   Performed at [facility], [address] [place], [place] [address]   Iron 09/12/2022 27 (L)  45 - 182 ug/dL Final   TIBC 75/64/3329 360  250 - 450 ug/dL Final   Saturation Ratios 09/12/2022 8 (L)  17.9 - 39.5 % Final   UIBC 09/12/2022 333  ug/dL Final   Performed at [facility], [address] [place], [place] [address]   Ferritin 09/12/2022 92  24 - 336 ng/mL Final   Performed at [facility], [address] [place] [address], [place], [place] [address]   Retic Ct Pct 09/12/2022 0.5  0.4 - 3.1 % Final   RBC. 09/12/2022 2.51 (L)  4.22 - 5.81 MIL/uL Final   Retic Count, Absolute 09/12/2022 13.3 (L)  19.0 - 186.0 K/uL Final   Immature Retic Fract 09/12/2022 40.9 (H)  2.3 - 15.9 % Final   Performed at [facility], [address], [place], [place] [address]   LDH 09/12/2022 135  98 - 192 U/L Final   Performed at [facility], [address], [place], [place] [address]   Haptoglobin 09/12/2022 163  29 - 370 mg/dL Final  Comment: (NOTE) Performed At: [facility] [address] [place], [place] [id number] Jolene Schimke MD UU:[id number]    TSH 09/12/2022 1.658  0.350 - 4.500 uIU/mL Final   Comment: Performed by a 3rd Generation assay with a functional sensitivity of <=0.01 uIU/mL. Performed at [facility], [address] [place], [place] [address]    Hemoglobin 09/12/2022 7.7 (L)  13.0 - 17.0 g/dL Final   HCT 95/63/8756 23.1 (L)  39.0 - 52.0 % Final   Performed at [facility], [address], [place], [place] [address]   WBC 09/13/2022 7.3  4.0 - 10.5 K/uL Final   RBC 09/13/2022 2.71 (L)  4.22 - 5.81 MIL/uL Final   Hemoglobin 09/13/2022 7.9 (L)  13.0 -  17.0 g/dL Final   HCT 51/88/4166 24.4 (L)  39.0 - 52.0 % Final   MCV 09/13/2022 90.0  80.0 - 100.0 fL Final   MCH 09/13/2022 29.2  26.0 - 34.0 pg Final   MCHC 09/13/2022 32.4  30.0 - 36.0 g/dL Final   RDW 07/26/1599 15.9 (H)  11.5 - 15.5 % Final   Platelets 09/13/2022 188  150 - 400 K/uL Final   nRBC 09/13/2022 0.4 (H)  0.0 - 0.2 % Final   Performed at [facility], [address] [place], [place] [address]   Sodium 09/13/2022 136  135 - 145 mmol/L Final   Potassium 09/13/2022 3.9  3.5 - 5.1 mmol/L Final   Chloride 09/13/2022 103  98 - 111 mmol/L Final   CO2 09/13/2022 25  22 - 32 mmol/L Final   Glucose, Bld 09/13/2022 95  70 - 99 mg/dL Final   Glucose reference range applies only to samples taken after fasting for at least 8 hours.   BUN 09/13/2022 10  6 - 20 mg/dL Final   Creatinine, Ser 09/13/2022 0.88  0.61 - 1.24 mg/dL Final   Calcium 55/73/2202 8.6 (L)  8.9 - 10.3 mg/dL Final   GFR, Estimated 09/13/2022 >60  >60 mL/min Final   Comment: (NOTE) Calculated using the CKD-EPI Creatinine Equation (2021)    Anion gap 09/13/2022 8  5 - 15 Final   Performed at [facility], [address] [place], [place] [address]   WBC 09/14/2022 5.0  4.0 - 10.5 K/uL Final   RBC 09/14/2022 2.87 (L)  4.22 - 5.81 MIL/uL Final   Hemoglobin 09/14/2022 8.5 (L)  13.0 - 17.0 g/dL Final   HCT 62/37/6283 26.1 (L)  39.0 - 52.0 % Final   MCV 09/14/2022 90.9  80.0 - 100.0 fL Final   MCH 09/14/2022 29.6  26.0 - 34.0 pg Final   MCHC 09/14/2022 32.6  30.0 - 36.0 g/dL Final   RDW 15/17/6160 16.6 (H)  11.5 - 15.5 % Final   Platelets 09/14/2022 220  150 - 400 K/uL Final   nRBC 09/14/2022 0.4 (H)  0.0 - 0.2 % Final   Performed at [facility], [address] [place], [place] [address]   Sodium 09/14/2022 135  135 - 145 mmol/L Final   Potassium 09/14/2022 3.9  3.5 - 5.1 mmol/L Final   Chloride 09/14/2022 100  98 - 111 mmol/L Final   CO2 09/14/2022 28  22 -  32 mmol/L Final   Glucose, Bld 09/14/2022 103 (H)  70 - 99 mg/dL Final   Glucose reference range applies only to samples taken after fasting for at least 8 hours.   BUN 09/14/2022 7  6 - 20 mg/dL  Final   Creatinine, Ser 09/14/2022 0.78  0.61 - 1.24 mg/dL Final   Calcium 16/10/9602 9.0  8.9 - 10.3 mg/dL Final   GFR, Estimated 09/14/2022 >60  >60 mL/min Final   Comment: (NOTE) Calculated using the CKD-EPI Creatinine Equation (2021)    Anion gap 09/14/2022 7  5 - 15 Final   Performed at [facility], [address], [place], [place] [address]  Hospital Outpatient Visit on 09/07/2022  Component Date Value Ref Range Status   Sodium 09/07/2022 132 (L)  135 - 145 mmol/L Final   Potassium 09/07/2022 4.5  3.5 - 5.1 mmol/L Final   Chloride 09/07/2022 95 (L)  98 - 111 mmol/L Final   CO2 09/07/2022 26  22 - 32 mmol/L Final   Glucose, Bld 09/07/2022 99  70 - 99 mg/dL Final   Glucose reference range applies only to samples taken after fasting for at least 8 hours.   BUN 09/07/2022 8  6 - 20 mg/dL Final   Creatinine, Ser 09/07/2022 0.69  0.61 - 1.24 mg/dL Final   Calcium 11/91/4782 9.2  8.9 - 10.3 mg/dL Final   GFR, Estimated 09/07/2022 >60  >60 mL/min Final   Comment: (NOTE) Calculated using the CKD-EPI Creatinine Equation (2021)    Anion gap 09/07/2022 11  5 - 15 Final   Performed at [facility], [address], [place], [place] [address]   WBC 09/07/2022 4.7  4.0 - 10.5 K/uL Final   RBC 09/07/2022 4.41  4.22 - 5.81 MIL/uL Final   Hemoglobin 09/07/2022 12.7 (L)  13.0 - 17.0 g/dL Final   HCT 30/86/5784 38.0 (L)  39.0 - 52.0 % Final   MCV 09/07/2022 86.2  80.0 - 100.0 fL Final   MCH 09/07/2022 28.8  26.0 - 34.0 pg Final   MCHC 09/07/2022 33.4  30.0 - 36.0 g/dL Final   RDW 69/62/9528 15.5  11.5 - 15.5 % Final   Platelets 09/07/2022 190  150 - 400 K/uL Final   nRBC 09/07/2022 0.0  0.0 - 0.2 % Final   Performed at [facility], [address], [place], [place] [address]     X-Rays:No results found.  EKG: Orders placed or performed in visit on 07/29/22   EKG 12-Lead     Hospital Course: Reginald Walsh is a 57 y.o. who was admitted to [facility]. They were brought to the operating room on 09/08/2022 and underwent Procedure(s): IRRIGATION AND DEBRIDEMENT HIP.  Patient tolerated the procedure well and was later transferred to the recovery room and then to the orthopaedic floor for postoperative care. They were given PO and IV analgesics for pain control following their surgery. They were given 24 hours of postoperative antibiotics of  Anti-infectives (From admission, onward)    Start     Dose/Rate Route Frequency Ordered Stop   09/14/22 0000  fluconazole (DIFLUCAN) 200 MG tablet        400 mg Oral Daily 09/14/22 0724     09/09/22 1015  fluconazole (DIFLUCAN) tablet 400 mg  Status:  Discontinued        400 mg Oral Daily 09/09/22 0917 09/14/22 1613   09/08/22 2000  ceFAZolin (ANCEF) IVPB 2g/100 mL premix        2 g 200 mL/hr over 30 Minutes Intravenous Every 6 hours 09/08/22 1743 09/09/22 0650   09/08/22 1045  ceFAZolin (ANCEF) IVPB 2g/100 mL premix        2 g 200 mL/hr over  30 Minutes Intravenous On call to O.R. 09/08/22 1037 09/08/22 1327      and started on DVT prophylaxis in the form of Aspirin.   PT and OT were ordered for total joint protocol. Discharge planning consulted to help with postop disposition and equipment needs. Patient had a fair night on the evening of surgery. They started to get up OOB with therapy on POD #1.  ID consulted and made recommendations for oral antifungal on POD #1. Hgb dropped to 6.2 and he received 2 units PRBC. Hospitalist service consulted for hyponatremia, which was corrected with fluids. Continued working with PT for several days.  Pt was seen during rounds on day six and was ready to go home with plans for outpatient follow up at [facility]. he was discharged to home  later that day in stable condition.  Diet: Regular diet Activity: WBAT Follow-up: in 2 weeks Disposition: Home Discharged Condition: good   Discharge Instructions     Ambulatory referral to Hematology / Oncology   Complete by: As directed    In 2-3 weeks, anemia work-up and will need IV iron infusions   Call MD / Call 911   Complete by: As directed    If you experience chest pain or shortness of breath, CALL 911 and be transported to the hospital emergency room.  If you develope a fever above 101 F, pus (white drainage) or increased drainage or redness at the wound, or calf pain, call your surgeon's office.   Change dressing   Complete by: As directed    Maintain surgical dressing until follow up in the clinic. If the edges start to pull up, may reinforce with tape. If the dressing is no longer working, may remove and cover with gauze and tape, but must keep the area dry and clean.  Call with any questions or concerns.   Constipation Prevention   Complete by: As directed    Drink plenty of fluids.  Prune juice may be helpful.  You may use a stool softener, such as Colace (over the counter) 100 mg twice a day.  Use MiraLax (over the counter) for constipation as needed.   Diet - low sodium heart healthy   Complete by: As directed    Increase activity slowly as tolerated   Complete by: As directed    Weight bearing as tolerated with assist device (walker, cane, etc) as directed, use it as Reginald as suggested by your surgeon or therapist, typically at least 4-6 weeks.   Post-operative opioid taper instructions:   Complete by: As directed    POST-OPERATIVE OPIOID TAPER INSTRUCTIONS: It is important to wean off of your opioid medication as soon as possible. If you do not need pain medication after your surgery it is ok to stop day one. Opioids include: Codeine, Hydrocodone(Norco, Vicodin), Oxycodone(Percocet, oxycontin) and hydromorphone amongst others.  Reginald term and even short term use of  opiods can cause: Increased pain response Dependence Constipation Depression Respiratory depression And more.  Withdrawal symptoms can include Flu like symptoms Nausea, vomiting And more Techniques to manage these symptoms Hydrate well Eat regular healthy meals Stay active Use relaxation techniques(deep breathing, meditating, yoga) Do Not substitute Alcohol to help with tapering If you have been on opioids for less than two weeks and do not have pain than it is ok to stop all together.  Plan to wean off of opioids This plan should start within one week post op of your joint replacement. Maintain the same interval or  time between taking each dose and first decrease the dose.  Cut the total daily intake of opioids by one tablet each day Next start to increase the time between doses. The last dose that should be eliminated is the evening dose.      TED hose   Complete by: As directed    Use stockings (TED hose) for 2 weeks on both leg(s).  You may remove them at night for sleeping.      Allergies as of 09/14/2022       Reactions   Pollen Extract Anaphylaxis   Combined with congestion   Poison Ivy Extract Hives   Tegaderm Alginate Ag Rope Other (See Comments)   Can use plain Tegaderm   Escitalopram Oxalate Anxiety        Medication List     STOP taking these medications    Fluarix Quadrivalent 0.5 ML injection Generic drug: influenza vac split quadrivalent PF   HYDROcodone-acetaminophen 5-325 MG tablet Commonly known as: NORCO/VICODIN Replaced by: HYDROcodone-acetaminophen 7.5-325 MG tablet   levofloxacin 750 MG tablet Commonly known as: LEVAQUIN   linezolid 600 MG tablet Commonly known as: ZYVOX       TAKE these medications    albuterol 108 (90 Base) MCG/ACT inhaler Commonly known as: VENTOLIN HFA Inhale 2 puffs into the lungs every 6 (six) hours as needed for wheezing or shortness of breath.   Aspirin Low Dose 81 MG chewable tablet Generic drug:  aspirin Chew 1 tablet (81 mg total) by mouth 2 (two) times daily for 21 days.   augmented betamethasone dipropionate 0.05 % cream Commonly known as: DIPROLENE-AF Apply 1 application topically 2 (two) times daily as needed (eczema).   B-12 1000 MCG Tabs Take 1 tablet (1,000 mcg total) by mouth daily.   carvedilol 25 MG tablet Commonly known as: COREG Take 0.5 tablets (12.5 mg total) by mouth 2 (two) times daily.   DULoxetine 60 MG capsule Commonly known as: CYMBALTA Take 60 mg by mouth daily.   fluconazole 200 MG tablet Commonly known as: DIFLUCAN Take 2 tablets (400 mg total) by mouth daily.   fluticasone 50 MCG/ACT nasal spray Commonly known as: FLONASE Place 2 sprays into both nostrils in the morning and at bedtime.   folic acid 1 MG tablet Commonly known as: FOLVITE Take 1 tablet (1 mg total) by mouth daily.   HYDROcodone-acetaminophen 7.5-325 MG tablet Commonly known as: NORCO Take 1-2 tablets by mouth every 4 (four) hours as needed for severe pain (pain score 7-10). Replaces: HYDROcodone-acetaminophen 5-325 MG tablet   hydrocortisone 2.5 % cream Apply topically 2 (two) times daily. What changed:  how much to take when to take this reasons to take this   Ipratropium-Albuterol 20-100 MCG/ACT Aers respimat Commonly known as: COMBIVENT Inhale 1 puff into the lungs in the morning and at bedtime.   MENS MULTIVITAMIN PLUS PO Take 1 tablet by mouth daily.   methocarbamol 500 MG tablet Commonly known as: ROBAXIN Take 1 tablet (500 mg total) by mouth every 6 (six) hours as needed for muscle spasms.   montelukast 10 MG tablet Commonly known as: SINGULAIR TAKE 1 TABLET BY MOUTH EVERY DAY   pantoprazole 40 MG tablet Commonly known as: PROTONIX Take 40 mg by mouth daily.   polyethylene glycol powder 17 GM/SCOOP powder Commonly known as: GLYCOLAX/MIRALAX Take 17 g by mouth 2 (two) times daily.   Refresh Tears 0.5 % Soln Generic drug:  carboxymethylcellulose Place 1 drop into both eyes 3 (three) times daily  as needed (dry eyes).   rosuvastatin 20 MG tablet Commonly known as: CRESTOR TAKE 1 TABLET BY MOUTH EVERY DAY   Vitamin D (Ergocalciferol) 1.25 MG (50000 UNIT) Caps capsule Commonly known as: DRISDOL Take 50,000 Units by mouth once a week.               Discharge Care Instructions  (From admission, onward)           Start     Ordered   09/14/22 0000  Change dressing       Comments: Maintain surgical dressing until follow up in the clinic. If the edges start to pull up, may reinforce with tape. If the dressing is no longer working, may remove and cover with gauze and tape, but must keep the area dry and clean.  Call with any questions or concerns.   09/14/22 0724            Follow-up Information     Durene Romans, MD. Schedule an appointment as soon as possible for a visit in 2 week(s).   Specialty: Orthopedic Surgery Contact information: [address] [place] [address] [phone]                 Signed: Dennie Bible, PA-C Orthopedic Surgery 09/19/2022, 9:27 AM

## 2022-10-02 ENCOUNTER — Inpatient Hospital Stay: Payer: Managed Care, Other (non HMO) | Attending: Hematology and Oncology | Admitting: Hematology and Oncology

## 2022-10-02 ENCOUNTER — Inpatient Hospital Stay: Payer: Managed Care, Other (non HMO)

## 2022-10-02 VITALS — BP 114/72 | HR 81 | Temp 97.8°F | Resp 18 | Ht 75.0 in | Wt 231.2 lb

## 2022-10-02 DIAGNOSIS — I10 Essential (primary) hypertension: Secondary | ICD-10-CM | POA: Insufficient documentation

## 2022-10-02 DIAGNOSIS — D649 Anemia, unspecified: Secondary | ICD-10-CM | POA: Insufficient documentation

## 2022-10-02 DIAGNOSIS — E538 Deficiency of other specified B group vitamins: Secondary | ICD-10-CM | POA: Diagnosis not present

## 2022-10-02 DIAGNOSIS — Z87891 Personal history of nicotine dependence: Secondary | ICD-10-CM | POA: Diagnosis not present

## 2022-10-02 NOTE — Assessment & Plan Note (Signed)
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

## 2022-10-02 NOTE — Progress Notes (Signed)
[facility] CONSULT NOTE  Patient Care Team: [name], NP as PCP - General (Family Medicine) Parke Poisson, MD as PCP - Cardiology (Cardiology) Clinic, [facility]  CHIEF COMPLAINTS/PURPOSE OF CONSULTATION:  Acute anemia  HISTORY OF PRESENTING ILLNESS:  Reginald Walsh 57 y.o. male is here because of recent diagnosis of severe anemia.  Patient had a right hip replacement surgery in June 2023.  He was hospitalized recently with skin blistering and an infection of the joint with open wound and drainage.  He was diagnosed with fungal infection and was treated aggressively.  During hospitalization his hemoglobin dropped down from 12 down to 6.  He was given 2 units of PRBC and was discharged home with 8.5.  He was referred to [place] for further evaluation and treatment.  He had blood work done at the [facility] couple of days ago and his hemoglobin had improved up to 10.5.  Workup in the hospital showed iron studies were normal but slightly decreased B12 levels for which she is on B12 supplementation.  I reviewed her records extensively and collaborated the history with the patient.   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   40 mm by echo   Chest pain of uncertain etiology    Normal coronaries after abnormal Myoview Feb 2021 Echo March 2021 showed normal LVF- mild LVH    Chest pain of unknown etiology 02/2019   normal coronaries after abnormal Myoview   COPD (chronic obstructive pulmonary disease) (HCC)    Depression    Dyspnea    Encounter for screening for COVID-19 07/01/2021   Generalized enlarged lymph nodes 07/01/2021   GERD (gastroesophageal reflux disease)    Hypertension    normal RA dopplers   Hyponatremia    Other allergy status, other than to drugs and biological substances 07/01/2021   Other and unspecified complications of  medical care, not elsewhere classified 07/01/2021   Other ill-defined and unknown causes of morbidity and mortality 07/01/2021   Overweight    Pain in left knee 07/01/2021   Pain in right foot 07/01/2021   PTSD (post-traumatic stress disorder)     SURGICAL HISTORY: Past Surgical History:  Procedure Laterality Date   HEMATOMA EVACUATION Right 07/27/2021   Procedure: EVACUATION HEMATOMA;  Surgeon: Durene Romans, MD;  Location: [facility];  Service: Orthopedics;  Laterality: Right;   HIP ARTHROPLASTY Left 2021   HIP ARTHROPLASTY Right 2023   INCISION AND DRAINAGE Right 07/27/2021   Procedure: INCISIONAL AND NON-INCISIONAL WOUND DEBRIDEMENT AND PRIMARY WOUND CLOSURE;  Surgeon: Durene Romans, MD;  Location: [facility];  Service: Orthopedics;  Laterality: Right;   INCISION AND DRAINAGE HIP Right 07/08/2021   Procedure: EXCISIONAL AND NON EXCISIONAL DEBRIDEMENT HIP;  Surgeon: Durene Romans, MD;  Location: [facility];  Service: Orthopedics;  Laterality: Right;   INCISION AND DRAINAGE HIP Right 09/08/2022   Procedure: IRRIGATION AND DEBRIDEMENT HIP;  Surgeon: Durene Romans, MD;  Location: [facility];  Service: Orthopedics;  Laterality: Right;   KNEE ARTHROSCOPY Left 2008   LEFT HEART CATH AND CORONARY ANGIOGRAPHY N/A 03/21/2019   Procedure: LEFT HEART CATH AND CORONARY ANGIOGRAPHY;  Surgeon: Kathleene Hazel, MD;  Location: [facility];  Service: Cardiovascular;  Laterality: N/A;   LUMBAR LAMINECTOMY  2000   TENDON RELEASE Right 2018   foot  WISDOM TOOTH EXTRACTION     age 57    SOCIAL HISTORY: Social History   Socioeconomic History   Marital status: Married    Spouse name: Not on file   Number of children: Not on file   Years of education: Not on file   Highest education level: Not on file  Occupational History   Not on file  Tobacco Use   Smoking status: Former    Current packs/day: 0.00    Average packs/day: 0.5 packs/day for 7.0 years (3.5 ttl pk-yrs)    Types: Cigarettes    Start  date: 13    Quit date: 1989    Years since quitting: 35.7   Smokeless tobacco: Current    Types: Snuff, Chew  Vaping Use   Vaping status: Never Used  Substance and Sexual Activity   Alcohol use: Yes    Alcohol/week: 1.0 standard drink of alcohol    Types: 1 Cans of beer per week    Comment: occasional   Drug use: Never   Sexual activity: Yes    Birth control/protection: None  Other Topics Concern   Not on file  Social History Narrative   Not on file   Social Determinants of Health   Financial Resource Strain: Not on file  Food Insecurity: No Food Insecurity (09/08/2022)   Hunger Vital Sign    Worried About Running Out of Food in the Last Year: Never true    Ran Out of Food in the Last Year: Never true  Transportation Needs: No Transportation Needs (09/08/2022)   PRAPARE - Administrator, Civil Service (Medical): No    Lack of Transportation (Non-Medical): No  Physical Activity: Not on file  Stress: Not on file  Social Connections: Unknown (06/10/2021)   Received from [facility]   Social Network    Social Network: Not on file  Intimate Partner Violence: Not At Risk (09/08/2022)   Humiliation, Afraid, Rape, and Kick questionnaire    Fear of Current or Ex-Partner: No    Emotionally Abused: No    Physically Abused: No    Sexually Abused: No    FAMILY HISTORY: Family History  Adopted: Yes  Problem Relation Age of Onset   Lung cancer Father    Prostate cancer Father        Unsure of age of onset    ALLERGIES:  is allergic to pollen extract, poison ivy extract, tegaderm alginate ag rope, and escitalopram oxalate.  MEDICATIONS:  Current Outpatient Medications  Medication Sig Dispense Refill   albuterol (VENTOLIN HFA) 108 (90 Base) MCG/ACT inhaler Inhale 2 puffs into the lungs every 6 (six) hours as needed for wheezing or shortness of breath.     aspirin 81 MG chewable tablet Chew 1 tablet (81 mg total) by mouth 2 (two) times daily for 21 days. 42 tablet  0   augmented betamethasone dipropionate (DIPROLENE-AF) 0.05 % cream Apply 1 application topically 2 (two) times daily as needed (eczema).     carvedilol (COREG) 25 MG tablet Take 0.5 tablets (12.5 mg total) by mouth 2 (two) times daily. 60 tablet 0   cyanocobalamin 1000 MCG tablet Take 1 tablet (1,000 mcg total) by mouth daily. 30 tablet 3   DULoxetine (CYMBALTA) 60 MG capsule Take 60 mg by mouth daily.     fluconazole (DIFLUCAN) 200 MG tablet Take 2 tablets (400 mg total) by mouth daily. 30 tablet 1   fluticasone (FLONASE) 50 MCG/ACT nasal spray Place 2 sprays into both nostrils  in the morning and at bedtime. 18.2 mL 5   folic acid (FOLVITE) 1 MG tablet Take 1 tablet (1 mg total) by mouth daily. 30 tablet 3   HYDROcodone-acetaminophen (NORCO) 7.5-325 MG tablet Take 1-2 tablets by mouth every 4 (four) hours as needed for severe pain (pain score 7-10). 42 tablet 0   hydrocortisone 2.5 % cream Apply topically 2 (two) times daily. (Patient taking differently: Apply 1 Application topically daily as needed (itching).) 30 g 3   Ipratropium-Albuterol (COMBIVENT) 20-100 MCG/ACT AERS respimat Inhale 1 puff into the lungs in the morning and at bedtime.     methocarbamol (ROBAXIN) 500 MG tablet Take 1 tablet (500 mg total) by mouth every 6 (six) hours as needed for muscle spasms. 40 tablet 2   montelukast (SINGULAIR) 10 MG tablet TAKE 1 TABLET BY MOUTH EVERY DAY 90 tablet 1   Multiple Vitamins-Minerals (MENS MULTIVITAMIN PLUS PO) Take 1 tablet by mouth daily.      pantoprazole (PROTONIX) 40 MG tablet Take 40 mg by mouth daily.     polyethylene glycol powder (GLYCOLAX/MIRALAX) 17 GM/SCOOP powder Take 17 g by mouth 2 (two) times daily. 238 g 0   REFRESH TEARS 0.5 % SOLN Place 1 drop into both eyes 3 (three) times daily as needed (dry eyes).     rosuvastatin (CRESTOR) 20 MG tablet TAKE 1 TABLET BY MOUTH EVERY DAY 90 tablet 1   Vitamin D, Ergocalciferol, (DRISDOL) 1.25 MG (50000 UNIT) CAPS capsule Take 50,000  Units by mouth once a week.     No current facility-administered medications for this visit.    REVIEW OF SYSTEMS:   Constitutional: Denies fevers, chills or abnormal night sweats All other systems were reviewed with the patient and are negative.  PHYSICAL EXAMINATION: ECOG PERFORMANCE STATUS: 2 - Symptomatic, <50% confined to bed  Vitals:   10/02/22 1207  BP: 114/72  Pulse: 81  Resp: 18  Temp: 97.8 F (36.6 C)  SpO2: 98%   Filed Weights   10/02/22 1207  Weight: 231 lb 3.2 oz (104.9 kg)    GENERAL:alert, no distress and comfortable  LABORATORY DATA:  I have reviewed the data as listed Lab Results  Component Value Date   WBC 5.0 09/14/2022   HGB 8.5 (L) 09/14/2022   HCT 26.1 (L) 09/14/2022   MCV 90.9 09/14/2022   PLT 220 09/14/2022   Lab Results  Component Value Date   NA 135 09/14/2022   K 3.9 09/14/2022   CL 100 09/14/2022   CO2 28 09/14/2022    RADIOGRAPHIC STUDIES: I have personally reviewed the radiological reports and agreed with the findings in the report.  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 Lab review: 08/14/2021: Hemoglobin 10.7 09/07/2022: Hemoglobin 12.7 09/11/2022: Hemoglobin 6.2, ferritin 92, B12 197, 09/14/2022: Hemoglobin 8.5, MCV 90.9, RDW 16.6, LDH 135, reticulocyte count absolute 13.3, immature reticulocyte fraction 40.9 Cause of anemia: Blood loss and decreased production (anemia of chronic disease) 09/28/2020 4: Hemoglobin 10.5 (done at the [facility])  Discussion: I discussed with the patient that any hospitalization from an infection needs to anemia and the fact that he is improving gradually is indicated to of the normal bone marrow function.  Since the hemoglobin is greater than 10 there is no indication for erythropoietin stimulating agent.  The most likely cause of the anemia is anemia of acute illness/inflammation  Mild B12  deficiency: Currently on B12 and folic acid supplementation.  I encouraged him to take sublingual B12 after he runs out of the current B12 supply.  I also encouraged him to get monthly blood work for CBC until the hemoglobin crosses 12 and after that he can be checked less often.  I instructed him to call us and return back to see [place] if his hemoglobin drops below 10.  All questions were answered. The patient knows to call the clinic with any problems, questions or concerns.    Tamsen Meek, MD 10/02/22

## 2022-10-14 ENCOUNTER — Ambulatory Visit (HOSPITAL_BASED_OUTPATIENT_CLINIC_OR_DEPARTMENT_OTHER): Payer: Managed Care, Other (non HMO)

## 2022-10-14 DIAGNOSIS — R0602 Shortness of breath: Secondary | ICD-10-CM | POA: Diagnosis not present

## 2022-10-14 DIAGNOSIS — I517 Cardiomegaly: Secondary | ICD-10-CM

## 2022-10-14 DIAGNOSIS — I77810 Thoracic aortic ectasia: Secondary | ICD-10-CM | POA: Diagnosis not present

## 2022-10-14 DIAGNOSIS — I358 Other nonrheumatic aortic valve disorders: Secondary | ICD-10-CM | POA: Diagnosis not present

## 2022-10-14 LAB — ECHOCARDIOGRAM COMPLETE
Area-P 1/2: 3.27 cm2
S' Lateral: 3.89 cm

## 2022-12-14 ENCOUNTER — Emergency Department (HOSPITAL_COMMUNITY): Payer: No Typology Code available for payment source

## 2022-12-14 ENCOUNTER — Other Ambulatory Visit: Payer: Self-pay

## 2022-12-14 ENCOUNTER — Emergency Department (HOSPITAL_COMMUNITY)
Admission: EM | Admit: 2022-12-14 | Discharge: 2022-12-14 | Disposition: A | Discharge status: Other Acute Inpatient Hospital | Payer: No Typology Code available for payment source | Attending: Emergency Medicine | Admitting: Emergency Medicine

## 2022-12-14 DIAGNOSIS — Z79899 Other long term (current) drug therapy: Secondary | ICD-10-CM | POA: Insufficient documentation

## 2022-12-14 DIAGNOSIS — R7401 Elevation of levels of liver transaminase levels: Secondary | ICD-10-CM | POA: Insufficient documentation

## 2022-12-14 DIAGNOSIS — Z7982 Long term (current) use of aspirin: Secondary | ICD-10-CM | POA: Diagnosis not present

## 2022-12-14 DIAGNOSIS — M25551 Pain in right hip: Secondary | ICD-10-CM | POA: Diagnosis present

## 2022-12-14 DIAGNOSIS — J449 Chronic obstructive pulmonary disease, unspecified: Secondary | ICD-10-CM | POA: Insufficient documentation

## 2022-12-14 DIAGNOSIS — D649 Anemia, unspecified: Secondary | ICD-10-CM | POA: Diagnosis not present

## 2022-12-14 DIAGNOSIS — R000 Tachycardia, unspecified: Secondary | ICD-10-CM | POA: Insufficient documentation

## 2022-12-14 DIAGNOSIS — I10 Essential (primary) hypertension: Secondary | ICD-10-CM | POA: Diagnosis not present

## 2022-12-14 LAB — I-STAT CHEM 8, ED
BUN: 11 mg/dL (ref 6–20)
Calcium, Ion: 1.15 mmol/L (ref 1.15–1.40)
Chloride: 96 mmol/L — ABNORMAL LOW (ref 98–111)
Creatinine, Ser: 0.8 mg/dL (ref 0.61–1.24)
Glucose, Bld: 123 mg/dL — ABNORMAL HIGH (ref 70–99)
HCT: 15 % — ABNORMAL LOW (ref 39.0–52.0)
Hemoglobin: 5.1 g/dL — CL (ref 13.0–17.0)
Potassium: 4.5 mmol/L (ref 3.5–5.1)
Sodium: 128 mmol/L — ABNORMAL LOW (ref 135–145)
TCO2: 23 mmol/L (ref 22–32)

## 2022-12-14 LAB — URINALYSIS, ROUTINE W REFLEX MICROSCOPIC
Bacteria, UA: NONE SEEN
Bilirubin Urine: NEGATIVE
Glucose, UA: NEGATIVE mg/dL
Hgb urine dipstick: NEGATIVE
Ketones, ur: NEGATIVE mg/dL
Leukocytes,Ua: NEGATIVE
Nitrite: NEGATIVE
Protein, ur: 30 mg/dL — AB
Specific Gravity, Urine: 1.032 — ABNORMAL HIGH (ref 1.005–1.030)
pH: 5 (ref 5.0–8.0)

## 2022-12-14 LAB — CBC WITH DIFFERENTIAL/PLATELET
Abs Immature Granulocytes: 0.1 10*3/uL — ABNORMAL HIGH (ref 0.00–0.07)
Basophils Absolute: 0 10*3/uL (ref 0.0–0.1)
Basophils Relative: 0 %
Eosinophils Absolute: 0.1 10*3/uL (ref 0.0–0.5)
Eosinophils Relative: 1 %
HCT: 16.7 % — ABNORMAL LOW (ref 39.0–52.0)
Hemoglobin: 5.4 g/dL — CL (ref 13.0–17.0)
Immature Granulocytes: 1 %
Lymphocytes Relative: 5 %
Lymphs Abs: 0.6 10*3/uL — ABNORMAL LOW (ref 0.7–4.0)
MCH: 29.3 pg (ref 26.0–34.0)
MCHC: 32.3 g/dL (ref 30.0–36.0)
MCV: 90.8 fL (ref 80.0–100.0)
Monocytes Absolute: 1.1 10*3/uL — ABNORMAL HIGH (ref 0.1–1.0)
Monocytes Relative: 10 %
Neutro Abs: 8.6 10*3/uL — ABNORMAL HIGH (ref 1.7–7.7)
Neutrophils Relative %: 83 %
Platelets: 576 10*3/uL — ABNORMAL HIGH (ref 150–400)
RBC: 1.84 MIL/uL — ABNORMAL LOW (ref 4.22–5.81)
RDW: 16.1 % — ABNORMAL HIGH (ref 11.5–15.5)
WBC: 10.4 10*3/uL (ref 4.0–10.5)
nRBC: 0 % (ref 0.0–0.2)

## 2022-12-14 LAB — COMPREHENSIVE METABOLIC PANEL
ALT: 93 U/L — ABNORMAL HIGH (ref 0–44)
AST: 181 U/L — ABNORMAL HIGH (ref 15–41)
Albumin: 2.5 g/dL — ABNORMAL LOW (ref 3.5–5.0)
Alkaline Phosphatase: 152 U/L — ABNORMAL HIGH (ref 38–126)
Anion gap: 11 (ref 5–15)
BUN: 12 mg/dL (ref 6–20)
CO2: 23 mmol/L (ref 22–32)
Calcium: 8.6 mg/dL — ABNORMAL LOW (ref 8.9–10.3)
Chloride: 94 mmol/L — ABNORMAL LOW (ref 98–111)
Creatinine, Ser: 0.75 mg/dL (ref 0.61–1.24)
GFR, Estimated: >60 mL/min (ref >60)
Glucose, Bld: 128 mg/dL — ABNORMAL HIGH (ref 70–99)
Potassium: 4.4 mmol/L (ref 3.5–5.1)
Sodium: 128 mmol/L — ABNORMAL LOW (ref 135–145)
Total Bilirubin: 0.8 mg/dL (ref <1.2)
Total Protein: 5.9 g/dL — ABNORMAL LOW (ref 6.5–8.1)

## 2022-12-14 LAB — C-REACTIVE PROTEIN: CRP: 23.1 mg/dL — ABNORMAL HIGH (ref <1.0)

## 2022-12-14 LAB — SEDIMENTATION RATE: Sed Rate: 105 mm/h — ABNORMAL HIGH (ref 0–16)

## 2022-12-14 LAB — LACTIC ACID, PLASMA
Lactic Acid, Venous: 0.9 mmol/L (ref 0.5–1.9)
Lactic Acid, Venous: 0.9 mmol/L (ref 0.5–1.9)

## 2022-12-14 LAB — ABO/RH: ABO/RH(D): A POS

## 2022-12-14 LAB — MAGNESIUM: Magnesium: 1.9 mg/dL (ref 1.7–2.4)

## 2022-12-14 LAB — PROTIME-INR
INR: 1.2 (ref 0.8–1.2)
Prothrombin Time: 15.1 s (ref 11.4–15.2)

## 2022-12-14 LAB — PREPARE RBC (CROSSMATCH)

## 2022-12-14 MED ORDER — SODIUM CHLORIDE 0.9 % IV SOLN
2.0000 g | Freq: Once | INTRAVENOUS | Status: AC
  Administered 2022-12-14: 2 g via INTRAVENOUS
  Filled 2022-12-14: qty 12.5

## 2022-12-14 MED ORDER — SODIUM CHLORIDE 0.9% IV SOLUTION
Freq: Once | INTRAVENOUS | Status: AC
Start: 2022-12-14 — End: 2022-12-14

## 2022-12-14 MED ORDER — VANCOMYCIN HCL 1750 MG/350ML IV SOLN: 1750.0000 mg | Freq: Two times a day (BID) | INTRAVENOUS | Status: DC

## 2022-12-14 MED ORDER — ACETAMINOPHEN 325 MG PO TABS
650.0000 mg | ORAL_TABLET | Freq: Once | ORAL | Status: AC
  Administered 2022-12-14: 650 mg via ORAL
  Filled 2022-12-14: qty 2

## 2022-12-14 MED ORDER — HYDROMORPHONE HCL 1 MG/ML IJ SOLN
1.0000 mg | INTRAMUSCULAR | Status: DC | PRN
  Administered 2022-12-14 (×3): 1 mg via INTRAVENOUS
  Filled 2022-12-14 (×4): qty 1

## 2022-12-14 MED ORDER — IOHEXOL 300 MG/ML  SOLN
100.0000 mL | Freq: Once | INTRAMUSCULAR | Status: AC | PRN
  Administered 2022-12-14: 100 mL via INTRAVENOUS

## 2022-12-14 MED ORDER — KETOROLAC TROMETHAMINE 15 MG/ML IJ SOLN
15.0000 mg | Freq: Once | INTRAMUSCULAR | Status: AC
  Administered 2022-12-14: 15 mg via INTRAVENOUS
  Filled 2022-12-14: qty 1

## 2022-12-14 MED ORDER — HYDROMORPHONE HCL 1 MG/ML IJ SOLN
1.0000 mg | Freq: Once | INTRAMUSCULAR | Status: AC
  Administered 2022-12-14: 1 mg via INTRAVENOUS
  Filled 2022-12-14: qty 1

## 2022-12-14 MED ORDER — LACTATED RINGERS IV BOLUS (SEPSIS)
1000.0000 mL | Freq: Once | INTRAVENOUS | Status: AC
Start: 2022-12-14 — End: 2022-12-14
  Administered 2022-12-14: 1000 mL via INTRAVENOUS

## 2022-12-14 MED ORDER — OXYCODONE HCL 5 MG PO TABS
10.0000 mg | ORAL_TABLET | Freq: Once | ORAL | Status: AC
  Administered 2022-12-14: 10 mg via ORAL
  Filled 2022-12-14: qty 2

## 2022-12-14 MED ORDER — VANCOMYCIN HCL 2000 MG/400ML IV SOLN
2000.0000 mg | Freq: Once | INTRAVENOUS | Status: AC
  Administered 2022-12-14: 2000 mg via INTRAVENOUS
  Filled 2022-12-14: qty 400

## 2022-12-14 NOTE — Progress Notes (Signed)
Pharmacy Antibiotic Note  Reginald Walsh is a 57 y.o. male admitted on 12/14/2022 with sepsis.  Pharmacy has been consulted for vancomycin dosing.  Plan: Vancomycin 2g IV bolus x 1 dose Vancomycin 1.75g IV q12h  Height: 6\' 3"  (190.5 cm) Weight: 99.8 kg (220 lb) IBW/kg (Calculated) : 84.5  Temp (24hrs), Avg:99.3 F (37.4 C), Min:97.8 F (36.6 C), Max:100.7 F (38.2 C)  Recent Labs  Lab 12/14/22 1205  WBC 10.4  CREATININE 0.75  LATICACIDVEN 0.9    Estimated Creatinine Clearance: 120.3 mL/min (by C-G formula based on SCr of 0.75 mg/dL).    Allergies  Allergen Reactions   Pollen Extract Anaphylaxis    Combined with congestion   Poison Ivy Extract Hives   Tegaderm Alginate Ag Rope Other (See Comments)    Can use plain Tegaderm   Escitalopram Oxalate Anxiety    Antimicrobials this admission: 11/18 cefepime 2g IV >>  11/18 vancomycin IV >>   Microbiology results: 11/18 BCx: pending 11/18 UCx: pending  11/18 Fungal culture: pending  Thank you for allowing pharmacy to be a part of this patient's care.  Adrian Prows, PharmD Student 12/14/2022 1:59 PM

## 2022-12-14 NOTE — ED Provider Notes (Signed)
Patient initially seen by Dr. Durwin Nora.  Please see his note.  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  Patient underwent explant surgery 2 weeks ago drug eluding implant was placed.  Patient has been on Diflucan  Dr. Durwin Nora spoke with [facility].  He contacted the [facility] transfer center.  Patient would like to return back to [facility] with [facility] transfer 4:50 PM .  Will need to wait another one to two hours as surgeon is in the OR  Discussed with [facility] transfer line.  Patient has been accepted for transfer to [facility]  Dr. Andrez Grime is the accepting physician   Linwood Dibbles, MD 12/14/22 2043

## 2022-12-14 NOTE — ED Provider Notes (Signed)
[place] EMERGENCY DEPARTMENT AT [facility] Provider Note   CSN: [id number] Arrival date & time: 12/14/22  1116     History  Chief Complaint  Patient presents with   Hip Pain    Hardware removed    Reginald Walsh is a 57 y.o. male.  HPI Patient presents for right hip pain.  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  He was seen at [facility] for this.  He underwent explant surgery 2 weeks ago.  Drug-eluting implant was placed.  He has been on 800 mg fluconazole daily with estimated duration of 12 months.  His right hip pain has been worsening for the past several days.  Pain became severe today.  He is brought in by EMS who noted tachycardia prior to arrival.    Home Medications Prior to Admission medications   Medication Sig Start Date End Date Taking? Authorizing Provider  albuterol (VENTOLIN HFA) 108 (90 Base) MCG/ACT inhaler Inhale 2 puffs into the lungs every 6 (six) hours as needed for wheezing or shortness of breath.   Yes [provider]  aspirin 81 MG chewable tablet Chew 81 mg by mouth 2 (two) times daily. 12/09/22 01/14/23 Yes [provider]  augmented betamethasone dipropionate (DIPROLENE-AF) 0.05 % cream Apply 1 application topically 2 (two) times daily as needed (eczema).   Yes [provider]  carvedilol (COREG) 25 MG tablet Take 0.5 tablets (12.5 mg total) by mouth 2 (two) times daily. 07/29/22 07/24/23 Yes Alver Sorrow, NP  cyanocobalamin 1000 MCG tablet Take 1 tablet (1,000 mcg total) by mouth daily. 09/15/22  Yes Rai, Ripudeep K, MD  diclofenac Sodium (VOLTAREN) 1 % GEL Apply topically.   Yes [provider]  doxycycline (VIBRA-TABS) 100 MG tablet Take 1 tablet by mouth 2 (two) times daily. 09/29/22  Yes [provider]  DULoxetine (CYMBALTA) 60 MG capsule Take 60 mg by mouth daily.   Yes [provider]   fluconazole (DIFLUCAN) 200 MG tablet Take 2 tablets (400 mg total) by mouth daily. 09/14/22  Yes Cassandria Anger, PA-C  fluticasone (FLONASE) 50 MCG/ACT nasal spray Place 2 sprays into both nostrils in the morning and at bedtime. 07/01/21  Yes [name], NP  folic acid (FOLVITE) 1 MG tablet Take 1 tablet (1 mg total) by mouth daily. 09/15/22  Yes Rai, Ripudeep K, MD  hydrocortisone 2.5 % cream Apply topically 2 (two) times daily. Patient taking differently: Apply 1 Application topically daily as needed (itching). 08/18/21  Yes Hudnell, Judeth Cornfield, NP  Ipratropium-Albuterol (COMBIVENT) 20-100 MCG/ACT AERS respimat Inhale 1 puff into the lungs in the morning and at bedtime.   Yes [provider]  meloxicam (MOBIC) 7.5 MG tablet Take 7.5 mg by mouth daily. 12/04/22 01/08/23 Yes [provider]  methocarbamol (ROBAXIN) 500 MG tablet Take 1 tablet (500 mg total) by mouth every 6 (six) hours as needed for muscle spasms. Patient taking differently: Take 1,000 mg by mouth every 6 (six) hours as needed for muscle spasms. 09/14/22  Yes Rosalene Billings R, PA-C  montelukast (SINGULAIR) 10 MG tablet TAKE 1 TABLET BY MOUTH EVERY DAY 02/18/22  Yes Hudnell, Judeth Cornfield, NP  Multiple Vitamins-Minerals (MENS MULTIVITAMIN PLUS PO) Take 1 tablet by mouth daily.    Yes [provider]  naloxone (NARCAN) nasal spray 4 mg/0.1 mL Place into the nose. 11/25/21  Yes [provider]  oxyCODONE (OXY IR/ROXICODONE) 5  MG immediate release tablet Take by mouth. 12/09/22 12/14/22 Yes [provider]  pantoprazole (PROTONIX) 40 MG tablet Take 40 mg by mouth daily.   Yes [provider]  polyethylene glycol powder (GLYCOLAX/MIRALAX) 17 GM/SCOOP powder Take 17 g by mouth 2 (two) times daily. Patient taking differently: Take 17 g by mouth daily. 09/14/22  Yes Cassandria Anger, PA-C  pregabalin (LYRICA) 100 MG capsule Take 100 mg by mouth daily. Take with 50 mg to equal 150  12/09/22 01/08/23 Yes [provider]  pregabalin (LYRICA) 50 MG capsule Take 50 mg by mouth daily. Take with the 100 mg to equal 150 mg 09/22/22  Yes [provider]  REFRESH TEARS 0.5 % SOLN Place 1 drop into both eyes 3 (three) times daily as needed (dry eyes). 07/16/21  Yes [provider]  rosuvastatin (CRESTOR) 20 MG tablet TAKE 1 TABLET BY MOUTH EVERY DAY 01/21/22  Yes Alver Sorrow, NP  Vitamin D, Ergocalciferol, (DRISDOL) 1.25 MG (50000 UNIT) CAPS capsule Take 50,000 Units by mouth once a week. 08/18/22  Yes [provider]  sertraline (ZOLOFT) 50 MG tablet Take 100 mg by mouth daily.   02/14/20  [provider]  topiramate (TOPAMAX) 25 MG tablet Take 1 tablet (25 mg total) by mouth 2 (two) times daily. Start one at night for 4 days and then start taking one twice a day 01/18/20 02/14/20  Thresa Ross, MD      Allergies    Pollen extract, Poison ivy extract, Tegaderm alginate ag rope, and Escitalopram oxalate    Review of Systems   Review of Systems  Musculoskeletal:  Positive for arthralgias and joint swelling.  All other systems reviewed and are negative.   Physical Exam Updated Vital Signs BP (!) 121/90 (BP Location: Right Arm)   Pulse (!) 104   Temp 98.5 F (36.9 C) (Oral)   Resp 20   Ht 6\' 3"  (1.905 m)   Wt 99.8 kg   SpO2 96%   BMI 27.50 kg/m  Physical Exam Vitals and nursing note reviewed.  Constitutional:      General: He is not in acute distress.    Appearance: Normal appearance. He is well-developed. He is not toxic-appearing or diaphoretic.  HENT:     Head: Normocephalic and atraumatic.     Right Ear: External ear normal.     Left Ear: External ear normal.     Nose: Nose normal.     Mouth/Throat:     Mouth: Mucous membranes are moist.  Eyes:     Extraocular Movements: Extraocular movements intact.     Conjunctiva/sclera: Conjunctivae normal.  Cardiovascular:     Rate and Rhythm: Regular rhythm. Tachycardia  present.  Pulmonary:     Effort: Pulmonary effort is normal. No respiratory distress.  Abdominal:     General: There is no distension.     Palpations: Abdomen is soft.     Tenderness: There is no abdominal tenderness.  Musculoskeletal:        General: Swelling and tenderness present.     Cervical back: Normal range of motion and neck supple.  Skin:    General: Skin is warm and dry.     Coloration: Skin is not jaundiced or pale.  Neurological:     General: No focal deficit present.     Mental Status: He is alert and oriented to person, place, and time.  Psychiatric:        Mood and Affect: Mood normal.  Behavior: Behavior normal.     ED Results / Procedures / Treatments   Labs (all labs ordered are listed, but only abnormal results are displayed) Labs Reviewed  COMPREHENSIVE METABOLIC PANEL - Abnormal; Notable for the following components:      Result Value   Sodium 128 (*)    Chloride 94 (*)    Glucose, Bld 128 (*)    Calcium 8.6 (*)    Total Protein 5.9 (*)    Albumin 2.5 (*)    AST 181 (*)    ALT 93 (*)    Alkaline Phosphatase 152 (*)    All other components within normal limits  CBC WITH DIFFERENTIAL/PLATELET - Abnormal; Notable for the following components:   RBC 1.84 (*)    Hemoglobin 5.4 (*)    HCT 16.7 (*)    RDW 16.1 (*)    Platelets 576 (*)    Neutro Abs 8.6 (*)    Lymphs Abs 0.6 (*)    Monocytes Absolute 1.1 (*)    Abs Immature Granulocytes 0.10 (*)    All other components within normal limits  SEDIMENTATION RATE - Abnormal; Notable for the following components:   Sed Rate 105 (*)    All other components within normal limits  C-REACTIVE PROTEIN - Abnormal; Notable for the following components:   CRP 23.1 (*)    All other components within normal limits  CULTURE, BLOOD (ROUTINE X 2)  CULTURE, BLOOD (ROUTINE X 2)  FUNGUS CULTURE, BLOOD  LACTIC ACID, PLASMA  LACTIC ACID, PLASMA  PROTIME-INR  MAGNESIUM  URINALYSIS, ROUTINE W REFLEX MICROSCOPIC   I-STAT CHEM 8, ED  TYPE AND SCREEN  PREPARE RBC (CROSSMATCH)  ABO/RH    EKG EKG Interpretation Date/Time:  Monday December 14 2022 11:49:05 EST Ventricular Rate:  110 PR Interval:  149 QRS Duration:  100 QT Interval:  335 QTC Calculation: 454 R Axis:   25  Text Interpretation: Sinus tachycardia Abnormal R-wave progression, early transition Confirmed by Gloris Manchester ([id number]) on 12/14/2022 11:52:45 AM  Radiology CT ABDOMEN PELVIS W CONTRAST  Result Date: 12/14/2022 CLINICAL DATA:  Status post hardware removal from right hip on 11/30/2022 with right hip pain and swelling EXAM: CT ABDOMEN AND PELVIS WITH CONTRAST TECHNIQUE: Multidetector CT imaging of the abdomen and pelvis was performed using the standard protocol following bolus administration of intravenous contrast. RADIATION DOSE REDUCTION: This exam was performed according to the departmental dose-optimization program which includes automated exposure control, adjustment of the mA and/or kV according to patient size and/or use of iterative reconstruction technique. CONTRAST:  OMNIPAQUE IOHEXOL 300 MG/ML  SOLN COMPARISON:  None Available. FINDINGS: Lower chest: No focal consolidation or pulmonary nodule in the lung bases. No pleural effusion or pneumothorax demonstrated. Partially imaged heart size is normal. Hepatobiliary: No focal hepatic lesions. No intra or extrahepatic biliary ductal dilation. Normal gallbladder. Pancreas: No focal lesions or main ductal dilation. Spleen: Normal in size without focal abnormality. Adrenals/Urinary Tract: No adrenal nodules. No suspicious renal mass, calculi or hydronephrosis. No focal bladder wall thickening. Suboptimal evaluation of the left posterior bladder due to beam hardening artifact from hip arthroplasty. Stomach/Bowel: Normal appearance of the stomach. No evidence of bowel wall thickening, distention, or inflammatory changes. Colonic diverticulosis without acute diverticulitis. Normal  appendix. Vascular/Lymphatic: Aortic atherosclerosis. 10 mm right external iliac lymph node (4:72). Reproductive: Prostate is unremarkable. Other: Trace presacral edema.  No free air or fluid collection. Musculoskeletal: Please see separately dictated CT right femur report for detailed findings. Left hip  arthroplasty. Multilevel degenerative changes of the partially imaged thoracic and lumbar spine. IMPRESSION: 1. No acute infectious/inflammatory findings in the abdomen or pelvis. 2. Right external iliac lymphadenopathy, likely reactive. 3. Please see separately dictated CT right femur report for detailed findings. Electronically Signed   By: Agustin Cree M.D.   On: 12/14/2022 14:30   DG Chest Port 1 View  Result Date: 12/14/2022 CLINICAL DATA:  Status post right hip hardware removal on 11/30/2022 with sepsis EXAM: PORTABLE CHEST 1 VIEW COMPARISON:  Chest radiograph dated 08/14/2021 FINDINGS: Normal lung volumes. No focal consolidations. No pleural effusion or pneumothorax. The heart size and mediastinal contours are within normal limits. No acute osseous abnormality. IMPRESSION: No active disease. Electronically Signed   By: Agustin Cree M.D.   On: 12/14/2022 14:23    Procedures Procedures    Medications Ordered in ED Medications  HYDROmorphone (DILAUDID) injection 1 mg (1 mg Intravenous Given 12/14/22 1422)  vancomycin (VANCOREADY) IVPB 2000 mg/400 mL (2,000 mg Intravenous New Bag/Given 12/14/22 1515)  oxyCODONE (Oxy IR/ROXICODONE) immediate release tablet 10 mg (has no administration in time range)  HYDROmorphone (DILAUDID) injection 1 mg (1 mg Intravenous Given 12/14/22 1141)  lactated ringers bolus 1,000 mL (1,000 mLs Intravenous New Bag/Given 12/14/22 1140)  HYDROmorphone (DILAUDID) injection 1 mg (1 mg Intravenous Given 12/14/22 1240)  0.9 %  sodium chloride infusion (Manually program via Guardrails IV Fluids) ( Intravenous New Bag/Given 12/14/22 1354)  ketorolac (TORADOL) 15 MG/ML injection 15  mg (15 mg Intravenous Given 12/14/22 1339)  ceFEPIme (MAXIPIME) 2 g in sodium chloride 0.9 % 100 mL IVPB (2 g Intravenous New Bag/Given 12/14/22 1424)  iohexol (OMNIPAQUE) 300 MG/ML solution 100 mL (100 mLs Intravenous Contrast Given 12/14/22 1341)  acetaminophen (TYLENOL) tablet 650 mg (650 mg Oral Given 12/14/22 1516)    ED Course/ Medical Decision Making/ A&P                                 Medical Decision Making Amount and/or Complexity of Data Reviewed Labs: ordered. Radiology: ordered. ECG/medicine tests: ordered.  Risk OTC drugs. Prescription drug management.   This patient presents to the ED for concern of right hip pain, this involves an extensive number of treatment options, and is a complaint that carries with it a high risk of complications and morbidity.  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  The pertinent results include: Acute on chronic anemia, neutrophilia and thrombocytosis are present.  Mild elevation in transaminases is present.  Inflammatory markers are elevated.   Imaging Studies ordered:  I ordered imaging studies including chest x-ray, CT of abdomen, pelvis, right femur I independently visualized and interpreted imaging which showed CT A/P showed reactive right external iliac lymphadenopathy.  No acute findings were identified within abdomen or pelvis.  CT of the femur was pending at time of signout. I agree with the radiologist interpretation   Cardiac Monitoring: / EKG:  The patient was  maintained on a cardiac monitor.  I personally viewed and interpreted the cardiac monitored which showed an underlying rhythm  of: Sinus rhythm  Problem List / ED Course / Critical interventions / Medication management  Patient presenting for worsening severity of right hip pain.  That he is 2 weeks postop from explant surgery for Candida septic arthritis on prosthetic hip.  He is on daily fluconazole.  On arrival, he is alert and oriented.  There is marked firm swelling to entire area of right hip.  He states that it has been swollen since his surgery but may have recently increased.  Severe pain began today but pain has been increasing over the past several days.  Septic workup was initiated.  Will obtain imaging of right hip area.  Patient required multiple doses of pain medication.  Lab work was notable for a 2 g/dL drop in hemoglobin since his time of recent discharge.  2 units of PRBCs was ordered.  CT imaging of right hip and femur showed large fluid collection with surrounding inflammatory stranding.  Patient was started on empiric antibiotics.  Tachycardia improved with antipyresis and pain control.  I spoke with [facility] transfer line.  At time of signout, awaiting callback from [facility] team.  Care of patient was signed out to oncoming ED provider. I ordered medication including Dilaudid and Toradol for analgesia; PRBCs for acute on chronic anemia; vancomycin and cefepime for empiric treatment of postoperative infection Reevaluation of the patient after these medicines showed that the patient improved I have reviewed the patients home medicines and have made adjustments as needed   Social Determinants of Health:  Has access to outpatient care  CRITICAL CARE Performed by: Gloris Manchester   Total critical care time: 34 minutes  Critical care time was exclusive of separately billable procedures and treating other patients.  Critical care was necessary to treat or prevent imminent or  life-threatening deterioration.  Critical care was time spent personally by me on the following activities: development of treatment plan with patient and/or surrogate as well as nursing, discussions with consultants, evaluation of patient's response to treatment, examination of patient, obtaining history from patient or surrogate, ordering and performing treatments and interventions, ordering and review of laboratory studies, ordering and review of radiographic studies, pulse oximetry and re-evaluation of patient's condition.         Final Clinical Impression(s) / ED Diagnoses Final diagnoses:  Right hip pain  Symptomatic anemia    Rx / DC Orders ED Discharge Orders     None         Gloris Manchester, MD 12/14/22 1609

## 2022-12-14 NOTE — ED Triage Notes (Signed)
Pt had hardware removed from right hip on 11/30/22 at [facility]. Pt now complains of pain and swelling in right hip area. Morphine 10 mg IV given in transport.

## 2022-12-14 NOTE — ED Notes (Signed)
Pt returned from CT °

## 2022-12-15 LAB — BPAM RBC
Blood Product Expiration Date: 202411192359
Blood Product Expiration Date: 202411202359
ISSUE DATE / TIME: 202411181348
ISSUE DATE / TIME: 202411181639
Unit Type and Rh: 600
Unit Type and Rh: 600

## 2022-12-15 LAB — TYPE AND SCREEN
ABO/RH(D): A POS
Antibody Screen: NEGATIVE
Unit division: 0
Unit division: 0

## 2022-12-19 LAB — CULTURE, BLOOD (ROUTINE X 2)
Culture: NO GROWTH
Culture: NO GROWTH

## 2023-06-12 ENCOUNTER — Emergency Department (HOSPITAL_BASED_OUTPATIENT_CLINIC_OR_DEPARTMENT_OTHER)
Admission: EM | Admit: 2023-06-12 | Discharge: 2023-06-12 | Disposition: A | Discharge status: Home / Self Care | Attending: Emergency Medicine | Admitting: Emergency Medicine

## 2023-06-12 ENCOUNTER — Emergency Department (HOSPITAL_BASED_OUTPATIENT_CLINIC_OR_DEPARTMENT_OTHER)

## 2023-06-12 ENCOUNTER — Other Ambulatory Visit: Payer: Self-pay

## 2023-06-12 DIAGNOSIS — K429 Umbilical hernia without obstruction or gangrene: Secondary | ICD-10-CM | POA: Insufficient documentation

## 2023-06-12 DIAGNOSIS — J449 Chronic obstructive pulmonary disease, unspecified: Secondary | ICD-10-CM | POA: Insufficient documentation

## 2023-06-12 DIAGNOSIS — J45909 Unspecified asthma, uncomplicated: Secondary | ICD-10-CM | POA: Insufficient documentation

## 2023-06-12 DIAGNOSIS — Z7951 Long term (current) use of inhaled steroids: Secondary | ICD-10-CM | POA: Diagnosis not present

## 2023-06-12 DIAGNOSIS — Z87891 Personal history of nicotine dependence: Secondary | ICD-10-CM | POA: Diagnosis not present

## 2023-06-12 DIAGNOSIS — R1907 Generalized intra-abdominal and pelvic swelling, mass and lump: Secondary | ICD-10-CM | POA: Diagnosis present

## 2023-06-12 DIAGNOSIS — I10 Essential (primary) hypertension: Secondary | ICD-10-CM | POA: Diagnosis not present

## 2023-06-12 DIAGNOSIS — Z79899 Other long term (current) drug therapy: Secondary | ICD-10-CM | POA: Diagnosis not present

## 2023-06-12 LAB — CBC WITH DIFFERENTIAL/PLATELET
Abs Immature Granulocytes: 0.02 10*3/uL (ref 0.00–0.07)
Basophils Absolute: 0.1 10*3/uL (ref 0.0–0.1)
Basophils Relative: 1 %
Eosinophils Absolute: 0.4 10*3/uL (ref 0.0–0.5)
Eosinophils Relative: 7 %
HCT: 35.6 % — ABNORMAL LOW (ref 39.0–52.0)
Hemoglobin: 11.8 g/dL — ABNORMAL LOW (ref 13.0–17.0)
Immature Granulocytes: 0 %
Lymphocytes Relative: 37 %
Lymphs Abs: 1.8 10*3/uL (ref 0.7–4.0)
MCH: 26.8 pg (ref 26.0–34.0)
MCHC: 33.1 g/dL (ref 30.0–36.0)
MCV: 80.7 fL (ref 80.0–100.0)
Monocytes Absolute: 0.5 10*3/uL (ref 0.1–1.0)
Monocytes Relative: 9 %
Neutro Abs: 2.3 10*3/uL (ref 1.7–7.7)
Neutrophils Relative %: 46 %
Platelets: 286 10*3/uL (ref 150–400)
RBC: 4.41 MIL/uL (ref 4.22–5.81)
RDW: 25.2 % — ABNORMAL HIGH (ref 11.5–15.5)
WBC: 5.1 10*3/uL (ref 4.0–10.5)
nRBC: 0 % (ref 0.0–0.2)

## 2023-06-12 LAB — BASIC METABOLIC PANEL WITH GFR
Anion gap: 11 (ref 5–15)
BUN: 13 mg/dL (ref 6–20)
CO2: 26 mmol/L (ref 22–32)
Calcium: 9.6 mg/dL (ref 8.9–10.3)
Chloride: 101 mmol/L (ref 98–111)
Creatinine, Ser: 0.8 mg/dL (ref 0.61–1.24)
GFR, Estimated: >60 mL/min (ref >60)
Glucose, Bld: 94 mg/dL (ref 70–99)
Potassium: 4.1 mmol/L (ref 3.5–5.1)
Sodium: 138 mmol/L (ref 135–145)

## 2023-06-12 MED ORDER — IOHEXOL 300 MG/ML  SOLN
100.0000 mL | Freq: Once | INTRAMUSCULAR | Status: AC | PRN
  Administered 2023-06-12: 100 mL via INTRAVENOUS

## 2023-06-12 NOTE — Discharge Instructions (Signed)
 You have a fat-containing umbilical hernia.  This does not require any surgery.  To do a surgery on it would be purely cosmetic.  It will not cause you any problems.  Follow-up with your primary care doctor.

## 2023-06-12 NOTE — ED Provider Notes (Signed)
 [facility] Provider Note  CSN: [id number] Arrival date & time: 06/12/23 1839  Chief Complaint(s) Hernia  HPI Reginald Walsh is a 58 y.o. male who is here today because he noticed a lump on his abdomen.  Patient has an extensive recent surgical history due to infections of his right hip.  The symptoms appear to be improved, or the patient is noticed today that when he sits forward, he has an area of swelling in his abdomen.  He does not have any pain with this.   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   40 mm by echo   Chest pain of uncertain etiology    Normal coronaries after abnormal Myoview  Feb 2021 Echo March 2021 showed normal LVF- mild LVH    Chest pain of unknown etiology 02/2019   normal coronaries after abnormal Myoview    COPD (chronic obstructive pulmonary disease) (HCC)    Depression    Dyspnea    Encounter for screening for COVID-19 07/01/2021   Generalized enlarged lymph nodes 07/01/2021   GERD (gastroesophageal reflux disease)    Hypertension    normal RA dopplers   Hyponatremia    Other allergy status, other than to drugs and biological substances 07/01/2021   Other and unspecified complications of medical care, not elsewhere classified 07/01/2021   Other ill-defined and unknown causes of morbidity and mortality 07/01/2021   Overweight    Pain in left knee 07/01/2021   Pain in right foot 07/01/2021   PTSD (post-traumatic stress disorder)    Patient Active Problem List   Diagnosis Date Noted   Normocytic anemia 10/02/2022   PICC (peripherally inserted central catheter) in place 08/21/2021   Medication management 08/20/2021   Candidiasis 08/20/2021   Right hip pain 07/25/2021   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   Alcohol  abuse 07/01/2021   Allergic rhinitis  07/01/2021   Anxiety 07/01/2021   Asthma 07/01/2021   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   Cellulitis of right lower limb 07/01/2021   Chronic sinusitis 07/01/2021   Complication of surgical procedure 07/01/2021   Depression 07/01/2021   Eczema 07/01/2021   Achilles tendinitis 07/01/2021   Flat foot 07/01/2021   History of alcohol  abuse 07/01/2021   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   Other and unspecified noninfectious gastroenteritis and colitis 07/01/2021   Other osteonecrosis, 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HCC) 07/01/2021   Presbyopia 07/01/2021   Thoracic or lumbosacral neuritis or radiculitis 07/01/2021   Tobacco use disorder 07/01/2021   Vitamin D deficiency 07/01/2021   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   History of total left hip arthroplasty 07/01/2021   Hyponatremia 04/21/2021   Hypomagnesemia 04/21/2021   Hypokalemia 04/21/2021   Chronic post-traumatic stress disorder 04/21/2021   Chronic obstructive pulmonary disease, unspecified (HCC) 04/21/2021   Gastroesophageal reflux disease 04/21/2021   Major depressive disorder, recurrent, moderate (HCC) 04/21/2021   Hyperlipidemia 04/21/2021   Cervical spondylosis 12/06/2019   Essential hypertension 06/02/2019   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HCC) 06/02/2019   Cervical radiculopathy 10/21/2016   Chronic back pain 04/21/2016   Home Medication(s) Prior to Admission medications   Medication Sig Start Date End Date Taking? Authorizing Provider  albuterol  (VENTOLIN  HFA) 108 (90 Base) MCG/ACT inhaler Inhale 2 puffs into the lungs every 6 (six) hours as needed for wheezing  or shortness of breath.    [provider]  augmented betamethasone dipropionate (DIPROLENE-AF) 0.05 % cream Apply 1 application topically 2 (two) times daily as needed (eczema).    [provider]  carvedilol  (COREG ) 25 MG tablet Take 0.5 tablets (12.5 mg total) by mouth 2 (two) times daily. 07/29/22 07/24/23  Clearnce Curia,  NP  cyanocobalamin  1000 MCG tablet Take 1 tablet (1,000 mcg total) by mouth daily. 09/15/22   Rai, Hurman Maiden, MD  diclofenac Sodium (VOLTAREN) 1 % GEL Apply topically.    [provider]  doxycycline  (VIBRA -TABS) 100 MG tablet Take 1 tablet by mouth 2 (two) times daily. 09/29/22   [provider]  DULoxetine  (CYMBALTA ) 60 MG capsule Take 60 mg by mouth daily.    [provider]  fluconazole  (DIFLUCAN ) 200 MG tablet Take 2 tablets (400 mg total) by mouth daily. 09/14/22   Earnie Gola, PA-C  fluticasone  (FLONASE ) 50 MCG/ACT nasal spray Place 2 sprays into both nostrils in the morning and at bedtime. 07/01/21   Versa Gore, NP  folic acid  (FOLVITE ) 1 MG tablet Take 1 tablet (1 mg total) by mouth daily. 09/15/22   Rai, Hurman Maiden, MD  hydrocortisone  2.5 % cream Apply topically 2 (two) times daily. Patient taking differently: Apply 1 Application topically daily as needed (itching). 08/18/21   Versa Gore, NP  Ipratropium-Albuterol  (COMBIVENT) 20-100 MCG/ACT AERS respimat Inhale 1 puff into the lungs in the morning and at bedtime.    [provider]  methocarbamol  (ROBAXIN ) 500 MG tablet Take 1 tablet (500 mg total) by mouth every 6 (six) hours as needed for muscle spasms. Patient taking differently: Take 1,000 mg by mouth every 6 (six) hours as needed for muscle spasms. 09/14/22   Earnie Gola, PA-C  montelukast  (SINGULAIR ) 10 MG tablet TAKE 1 TABLET BY MOUTH EVERY DAY 02/18/22   Versa Gore, NP  Multiple Vitamins-Minerals (MENS MULTIVITAMIN PLUS PO) Take 1 tablet by mouth daily.     [provider]  naloxone [facility]) nasal spray 4 mg/0.1 mL Place into the nose. 11/25/21   [provider]  pantoprazole  (PROTONIX ) 40 MG tablet Take 40 mg by mouth daily.    [provider]  polyethylene glycol powder (GLYCOLAX /MIRALAX ) 17 GM/SCOOP powder Take 17 g by mouth 2 (two) times daily. Patient taking differently: Take 17 g by  mouth daily. 09/14/22   Earnie Gola, PA-C  pregabalin  (LYRICA ) 50 MG capsule Take 50 mg by mouth daily. Take with the 100 mg to equal 150 mg 09/22/22   [provider]  REFRESH TEARS 0.5 % SOLN Place 1 drop into both eyes 3 (three) times daily as needed (dry eyes). 07/16/21   [provider]  rosuvastatin  (CRESTOR ) 20 MG tablet TAKE 1 TABLET BY MOUTH EVERY DAY 01/21/22   Walker, Caitlin S, NP  Vitamin D, Ergocalciferol, (DRISDOL) 1.25 MG (50000 UNIT) CAPS capsule Take 50,000 Units by mouth once a week. 08/18/22   [provider]  sertraline (ZOLOFT) 50 MG tablet Take 100 mg by mouth daily.   02/14/20  [provider]  topiramate  (TOPAMAX ) 25 MG tablet Take 1 tablet (25 mg total) by mouth 2 (two) times daily. Start one at night for 4 days and then start taking one twice a day 01/18/20 02/14/20  Wray Heady, MD  Past Surgical History Past Surgical History:  Procedure Laterality Date   HEMATOMA EVACUATION Right 07/27/2021   Procedure: EVACUATION HEMATOMA;  Surgeon: Claiborne Crew, MD;  Location: [facility];  Service: Orthopedics;  Laterality: Right;   HIP ARTHROPLASTY Left 2021   HIP ARTHROPLASTY Right 2023   INCISION AND DRAINAGE Right 07/27/2021   Procedure: INCISIONAL AND NON-INCISIONAL WOUND DEBRIDEMENT AND PRIMARY WOUND CLOSURE;  Surgeon: Claiborne Crew, MD;  Location: [facility];  Service: Orthopedics;  Laterality: Right;   INCISION AND DRAINAGE HIP Right 07/08/2021   Procedure: EXCISIONAL AND NON EXCISIONAL DEBRIDEMENT HIP;  Surgeon: Claiborne Crew, MD;  Location: [facility];  Service: Orthopedics;  Laterality: Right;   INCISION AND DRAINAGE HIP Right 09/08/2022   Procedure: IRRIGATION AND DEBRIDEMENT HIP;  Surgeon: Claiborne Crew, MD;  Location: [facility];  Service: Orthopedics;  Laterality: Right;   KNEE ARTHROSCOPY Left 2008   LEFT HEART CATH AND  CORONARY ANGIOGRAPHY N/A 03/21/2019   Procedure: LEFT HEART CATH AND CORONARY ANGIOGRAPHY;  Surgeon: Odie Benne, MD;  Location: [facility];  Service: Cardiovascular;  Laterality: N/A;   LUMBAR LAMINECTOMY  2000   TENDON RELEASE Right 2018   foot   WISDOM TOOTH EXTRACTION     age 6   Family History Family History  Adopted: Yes  Problem Relation Age of Onset   Lung cancer Father    Prostate cancer Father        Unsure of age of onset    Social History Social History   Tobacco Use   Smoking status: Former    Current packs/day: 0.00    Average packs/day: 0.5 packs/day for 7.0 years (3.5 ttl pk-yrs)    Types: Cigarettes    Start date: 85    Quit date: 1989    Years since quitting: 36.3   Smokeless tobacco: Current    Types: Snuff, Chew  Vaping Use   Vaping status: Never Used  Substance Use Topics   Alcohol  use: Yes    Alcohol /week: 1.0 standard drink of alcohol     Types: 1 Cans of beer per week    Comment: occasional   Drug use: Never   Allergies Pollen extract, Poison ivy extract, Tegaderm alginate ag rope, and Escitalopram  oxalate  Review of Systems Review of Systems  Physical Exam Vital Signs  I have reviewed the triage vital signs BP (!) 125/99   Pulse 90   Temp 98.3 F (36.8 C) (Oral)   Resp 18   Ht 6\' 3"  (1.905 m)   Wt 101.2 kg   SpO2 97%   BMI 27.87 kg/m   Physical Exam Vitals and nursing note reviewed.  Constitutional:      Appearance: Normal appearance.  Pulmonary:     Effort: Pulmonary effort is normal.  Abdominal:     General: Abdomen is flat.     Palpations: Abdomen is soft.     Comments: Small, soft easily reducible umbilical hernia  Neurological:     Mental Status: He is alert.     ED Results and Treatments Labs (all labs ordered are listed, but only abnormal results are displayed) Labs Reviewed  CBC WITH DIFFERENTIAL/PLATELET - Abnormal; Notable for the following components:      Result Value    Hemoglobin 11.8 (*)    HCT 35.6 (*)    RDW 25.2 (*)    All other components within normal limits  BASIC METABOLIC PANEL WITH GFR  CBC WITH DIFFERENTIAL/PLATELET  Radiology CT ABDOMEN PELVIS W CONTRAST Result Date: 06/12/2023 CLINICAL DATA:  Known umbilical hernia with increased pain and discomfort, initial encounter EXAM: CT ABDOMEN AND PELVIS WITH CONTRAST TECHNIQUE: Multidetector CT imaging of the abdomen and pelvis was performed using the standard protocol following bolus administration of intravenous contrast. RADIATION DOSE REDUCTION: This exam was performed according to the departmental dose-optimization program which includes automated exposure control, adjustment of the mA and/or kV according to patient size and/or use of iterative reconstruction technique. CONTRAST:  OMNIPAQUE  IOHEXOL  300 MG/ML  SOLN COMPARISON:  12/14/2022 FINDINGS: Lower chest: No acute abnormality. Hepatobiliary: No focal liver abnormality is seen. No gallstones, gallbladder wall thickening, or biliary dilatation. Pancreas: Unremarkable. No pancreatic ductal dilatation or surrounding inflammatory changes. Spleen: Normal in size without focal abnormality. Adrenals/Urinary Tract: Adrenal glands are within normal limits. Kidneys demonstrate a normal enhancement pattern bilaterally. No renal calculi or obstructive changes are seen. The bladder is well distended. Stomach/Bowel: No obstructive or inflammatory changes of the colon are noted. Scattered fecal material is noted throughout the colon. The appendix is within normal limits. Small bowel and stomach are unremarkable. Vascular/Lymphatic: Aortic atherosclerosis. No enlarged abdominal or pelvic lymph nodes. Reproductive: Prostate is unremarkable. Other: Small fat containing umbilical hernia is noted stable from the prior exam. No incarcerated bowel is  noted. No abdominopelvic ascites. Musculoskeletal: Postsurgical changes are noted in the hips bilaterally. Previously seen fluid collection surrounding the right hip on the prior CT examination is no longer identified. Small amount of joint fluid is seen. No other bony abnormality is noted. IMPRESSION: No acute abnormality to correspond with the given clinical history. Electronically Signed   By: Violeta Grey M.D.   On: 06/12/2023 21:52    Pertinent labs & imaging results that were available during my care of the patient were reviewed by me and considered in my medical decision making (see MDM for details).  Medications Ordered in ED Medications  iohexol  (OMNIPAQUE ) 300 MG/ML solution 100 mL (100 mLs Intravenous Contrast Given 06/12/23 2146)                                                                                                                                     Procedures Procedures  (including critical care time)  Medical Decision Making / ED Course   This patient presents to the ED for concern of abdominal swelling, this involves an extensive number of treatment options, and is a complaint that carries with it a high risk of complications and morbidity.  The differential diagnosis includes hernia.  MDM: Patient is a soft, easily reducible hernia.  Obtain CT imaging which showed a fat-containing hernia.  No additional intervention required.  Will discharge patient.  Reviewed the patient's labs, no leukocytosis, no anemia.   Additional history obtained: -Additional history obtained from wife at bedside -External records from outside source obtained and reviewed including: Chart review including previous notes, labs, imaging, consultation notes  Lab Tests: -I ordered, reviewed, and interpreted labs.   The pertinent results include:   Labs Reviewed  CBC WITH DIFFERENTIAL/PLATELET - Abnormal; Notable for the following components:      Result Value   Hemoglobin 11.8 (*)     HCT 35.6 (*)    RDW 25.2 (*)    All other components within normal limits  BASIC METABOLIC PANEL WITH GFR  CBC WITH DIFFERENTIAL/PLATELET     Imaging Studies ordered: I ordered imaging studies including CT abdomen pelvis I independently visualized and interpreted imaging. I agree with the radiologist interpretation   Medicines ordered and prescription drug management: Meds ordered this encounter  Medications   iohexol  (OMNIPAQUE ) 300 MG/ML solution 100 mL    -I have reviewed the patients home medicines and have made adjustments as needed   Cardiac Monitoring: The patient was maintained on a cardiac monitor.  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   40 mm by echo   Chest pain of uncertain etiology    Normal coronaries after abnormal Myoview  Feb 2021 Echo March 2021 showed normal LVF- mild LVH    Chest pain of unknown etiology 02/2019   normal coronaries after abnormal Myoview    COPD (chronic obstructive pulmonary disease) (HCC)    Depression    Dyspnea    Encounter for screening for COVID-19 07/01/2021   Generalized enlarged lymph nodes 07/01/2021   GERD (gastroesophageal reflux disease)    Hypertension    normal RA dopplers   Hyponatremia    Other allergy status, other than to drugs and biological substances 07/01/2021   Other and unspecified complications of medical care, not elsewhere classified 07/01/2021   Other ill-defined and unknown causes of morbidity and mortality  07/01/2021   Overweight    Pain in left knee 07/01/2021   Pain in right foot 07/01/2021   PTSD (post-traumatic stress disorder)       Dispostion: I considered admission for this patient, however the patient is appropriate for outpatient follow-up.     Final Clinical Impression(s) / ED Diagnoses Final diagnoses:  Umbilical hernia without obstruction and without gangrene     @PCDICTATION @    Afton Horse T, DO 06/12/23 2233

## 2023-06-12 NOTE — ED Triage Notes (Signed)
 Pt POV reporting increased warmth and discomfort around umbilical hernia. Denies fever or n/v, endorses normal bowel movement today.

## 2023-06-17 ENCOUNTER — Encounter: Payer: Self-pay | Admitting: Internal Medicine

## 2023-12-14 ENCOUNTER — Other Ambulatory Visit (HOSPITAL_COMMUNITY): Payer: Self-pay | Admitting: Chiropractic Medicine

## 2023-12-14 DIAGNOSIS — M542 Cervicalgia: Secondary | ICD-10-CM

## 2023-12-14 IMAGING — CT CTA CHEST W/ AND/OR W/O CM W/ OR W/O DISSECTION AND GATING
1 of 7 series · 3 of 16 positions shown, 4 images · IV contrast (OMNI 350)
Comparison: Chest CT-06/01/2019

CLINICAL DATA: Follow-up thoracic aortic aneurysm.

EXAM:
CT ANGIOGRAPHY CHEST WITH CONTRAST
TECHNIQUE: Multidetector CT imaging of the chest was performed using the
standard protocol during bolus administration of intravenous
contrast. Multiplanar CT image reconstructions and MIPs were
obtained to evaluate the vascular anatomy.

[Series 7: arterial thins · axial · arterial · 0.90mm/px · z∈[+1327,+1492]mm · 3 of 549 slices shown, 4 images]
[im 138/549  soft-tissue]
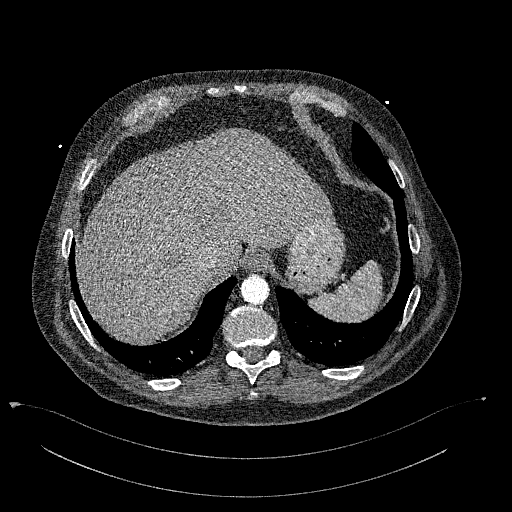
[im 138/549  bone]
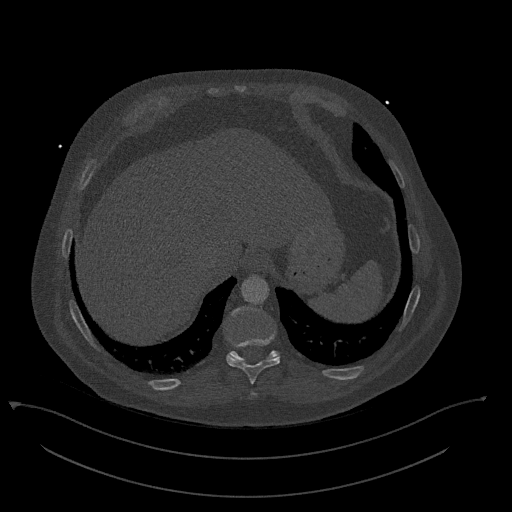
[im 275/549  soft-tissue]
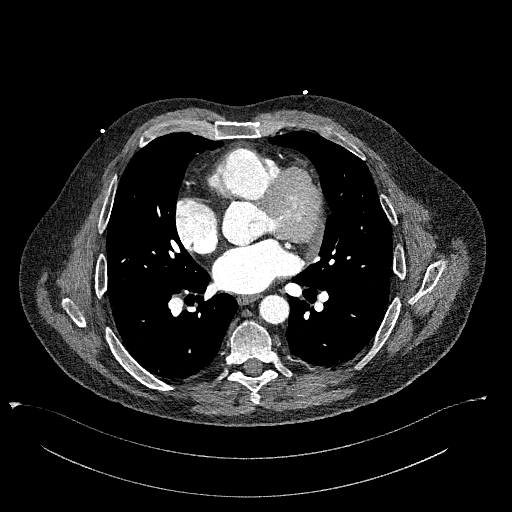
[im 412/549  soft-tissue]
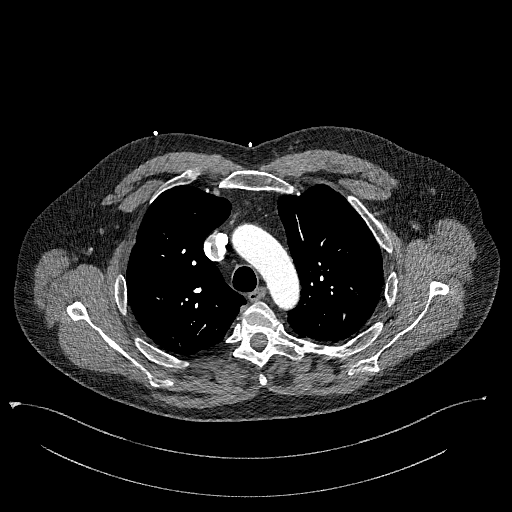

[3 of 16 positions shown; findings below may reference images not displayed]

RADIATION DOSE REDUCTION: This exam was performed according to the
departmental dose-optimization program which includes automated
exposure control, adjustment of the mA and/or kV according to
patient size and/or use of iterative reconstruction technique.

CONTRAST:  100mL OMNIPAQUE IOHEXOL 350 MG/ML SOLN
FINDINGS: Vascular Findings:

Mild fusiform aneurysmal dilatation of the ascending thoracic aorta
with measurements as follows. The thoracic aorta tapers to a normal
caliber at the level of the aortic arch. The descending thoracic
aorta is of normal caliber and widely patent without hemodynamically
significant narrowing. No evidence of thoracic aortic dissection or
perivascular stranding on this nongated examination

The left vertebral artery is noted to arise directly from the aortic
arch. The branch vessels of the aortic arch appear widely patent
throughout their imaged courses.

Borderline cardiomegaly. No pericardial effusion though a small
amount of fluid is seen within the pericardial recess.

Although this examination was not tailored for the evaluation the
pulmonary arteries, there are no discrete filling defects within the
central pulmonary arterial tree to suggest central pulmonary
embolism. Normal caliber of the main pulmonary artery.

-------------------------------------------------------------

Thoracic aortic measurements:

SINOTUBULAR JUNCTION: 38 mm as measured in greatest oblique short
axis coronal dimension.

PROXIMAL ASCENDING THORACIC AORTA: 40 mm as measured in greatest
oblique short axis axial dimension (axial image 64, series 6) at the
level of the main pulmonary artery and approximately 39 mm as
measured in greatest oblique short axis coronal dimension (coronal
image 79, series 9), unchanged compared to the [DATE] examination
by my direct remeasurement

AORTIC ARCH: 30 mm as measured in greatest oblique short axis
sagittal dimension.

PROXIMAL DESCENDING THORACIC AORTA: 27 mm as measured in greatest
oblique short axis axial dimension at the level of the main
pulmonary artery.

DISTAL DESCENDING THORACIC AORTA: 25 mm as measured in greatest
oblique short axis axial dimension at the level of the diaphragmatic
hiatus.

Review of the MIP images confirms the above findings.

-------------------------------------------------------------

Non-Vascular Findings:

Mediastinum/Lymph Nodes: No bulky mediastinal, hilar or axillary
lymphadenopathy.

Lungs/Pleura: Minimal dependent subpleural ground-glass atelectasis.
No discrete focal airspace opacities. No pleural effusion for
pneumothorax. The central pulmonary airways appear widely patent.

No discrete pulmonary nodules.

Upper abdomen: Limited early arterial phase evaluation of the upper
abdomen demonstrates diffuse decreased attenuation of the hepatic
parenchyma suggestive of hepatic steatosis. This finding is
associated with nodularity of the hepatic contour. No discrete
hyperenhancing hepatic lesions within the imaged hepatic parenchyma.

Musculoskeletal: Moderate (approximate 50%) compression deformity
involving the superior endplate of the T8 vertebral body without
associated retropulsion, age indeterminate though new compared to
the 06/01/2019 examination though without associated discrete
fracture line or paraspinal hematoma and thus favored to be
subacute/chronic in etiology. Regional soft tissues appear normal.
Normal appearance of the thyroid gland.
IMPRESSION: 1. Stable uncomplicated mild fusiform aneurysmal dilatation of the
ascending thoracic aorta measuring 40 mm, unchanged compared to the
[DATE] examination. Recommend annual imaging followup by CTA or
MRA. This recommendation follows 9232
ACCF/AHA/AATS/ACR/ASA/SCA/GON/TIGER/LOMBANA/SILVANA Guidelines for the
Diagnosis and Management of Patients with Thoracic Aortic Disease.
Circulation. 9232; 121: [id number]. Aortic aneurysm NOS (E2T2C-F2B.U)
2. Moderate (approximate 50%) compression deformity involving the
superior endplate of the T8 vertebral body, age indeterminate though
without associated fracture line or paraspinal hematoma and thus
favored to be subacute/chronic in etiology. Correlation point
tenderness at this location is advised.
3. Suspected hepatic steatosis with nodularity of the hepatic
contour as could be seen in the setting of early cirrhotic change.
Correlation with LFTs is advised.

## 2023-12-17 ENCOUNTER — Ambulatory Visit (HOSPITAL_BASED_OUTPATIENT_CLINIC_OR_DEPARTMENT_OTHER)
Admission: RE | Admit: 2023-12-17 | Discharge: 2023-12-17 | Disposition: A | Discharge status: Home / Self Care | Source: Ambulatory Visit | Attending: Chiropractic Medicine | Admitting: Chiropractic Medicine

## 2023-12-17 DIAGNOSIS — M542 Cervicalgia: Secondary | ICD-10-CM | POA: Insufficient documentation

## 2024-01-04 IMAGING — DX DG PORTABLE PELVIS
1 series · 1 of 1 positions shown · non-contrast
Comparison: None Available.

CLINICAL DATA: Right hip replacement

EXAM:
PORTABLE PELVIS 1-2 VIEWS

[pelvis ap]
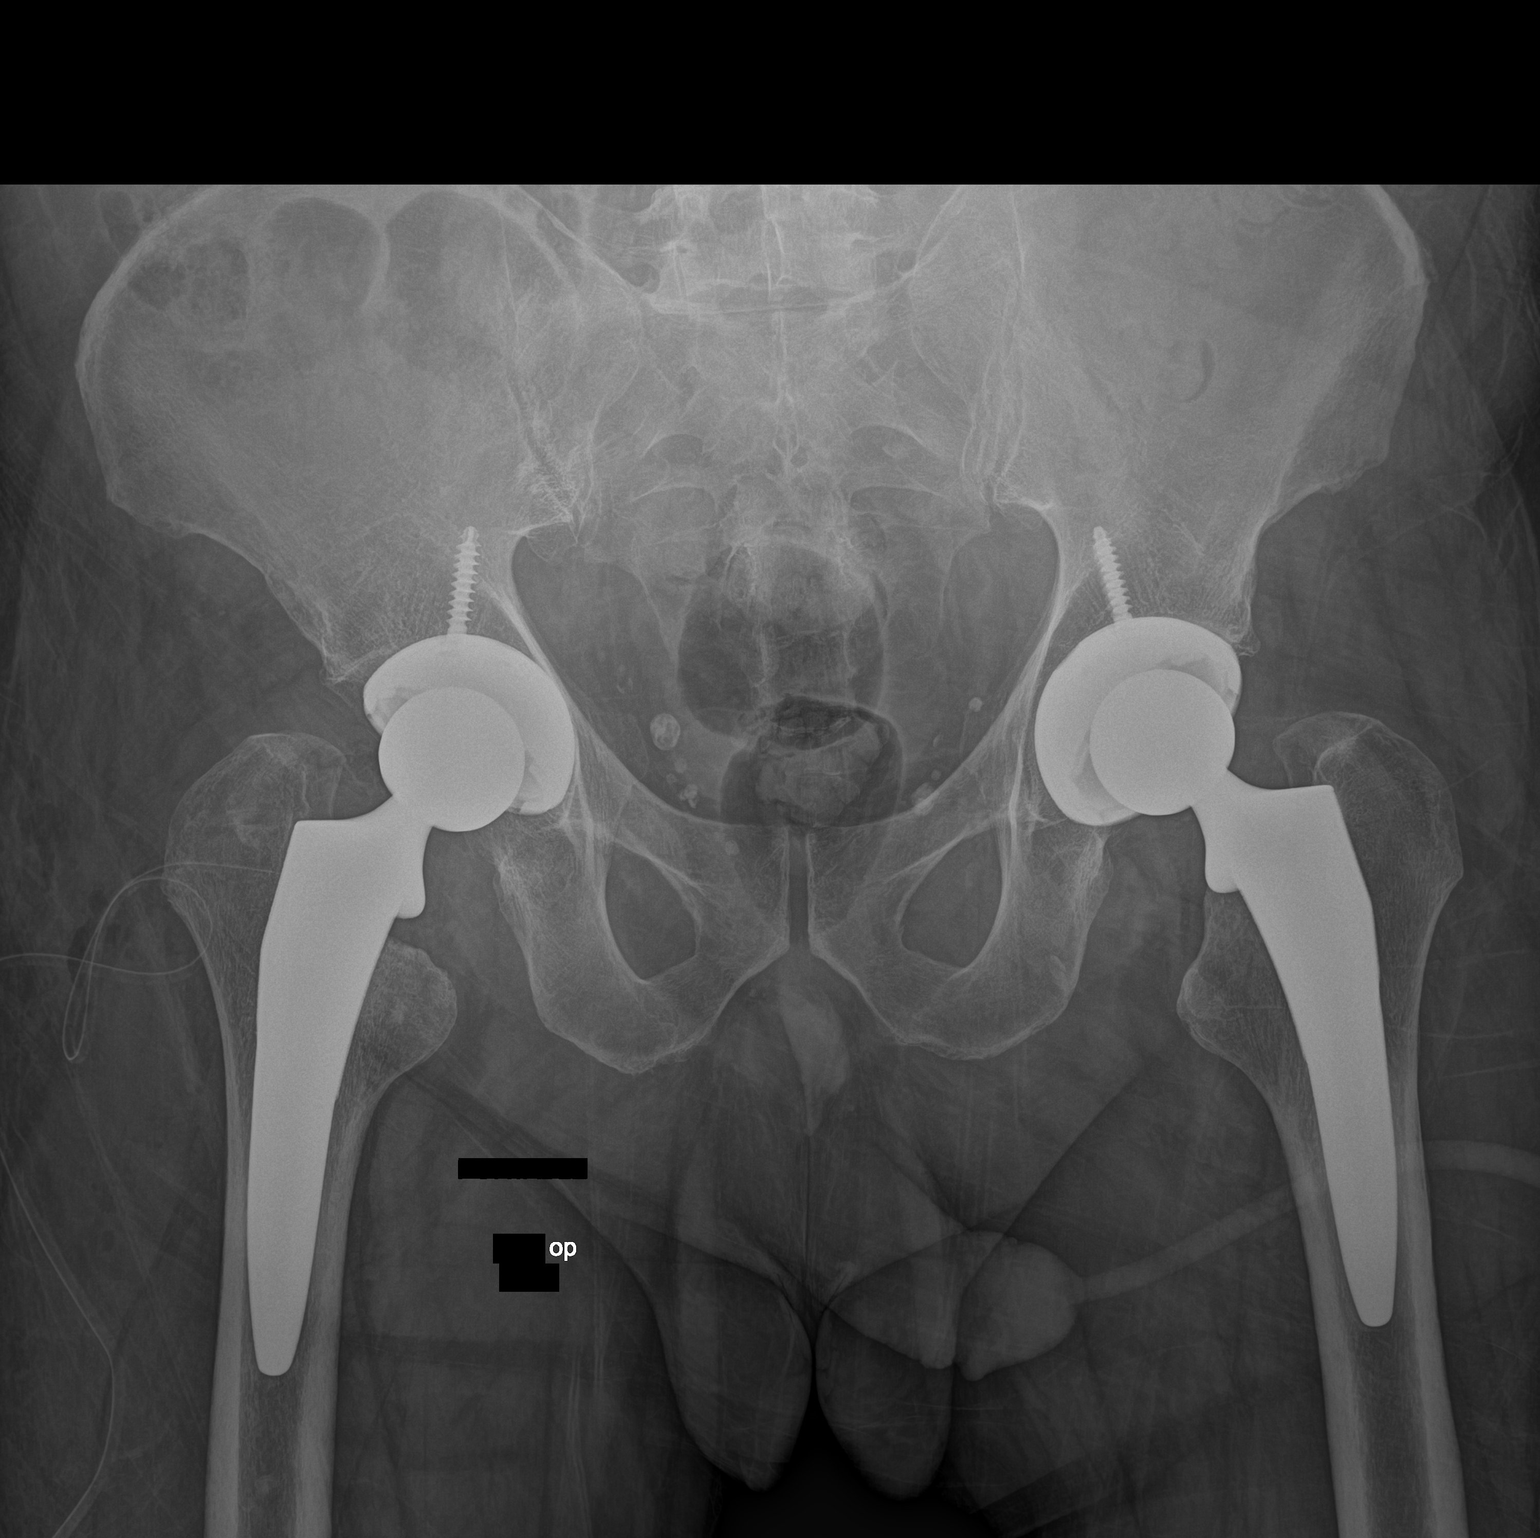

[1 of 1 positions shown; findings below may reference images not displayed]

FINDINGS: Bilateral total hip arthroplasty has been performed. Normal
alignment on this limited examination. No unexpected fracture or
dislocation. Surgical drains are seen surrounding the right hip.
IMPRESSION: Bilateral total hip arthroplasty. No unexpected fracture or
dislocation.
